# Patient Record
Sex: Male | Born: 1960
Health system: Southern US, Community
[De-identification: ages and names within clinical notes are randomized; demographics above are authoritative.]

## PROBLEM LIST (undated history)

## (undated) DIAGNOSIS — E119 Type 2 diabetes mellitus without complications: Secondary | ICD-10-CM

## (undated) DIAGNOSIS — M1990 Unspecified osteoarthritis, unspecified site: Secondary | ICD-10-CM

## (undated) DIAGNOSIS — I10 Essential (primary) hypertension: Secondary | ICD-10-CM

---
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
## 2016-05-17 ENCOUNTER — Encounter (HOSPITAL_COMMUNITY): Payer: Self-pay | Admitting: *Deleted

## 2016-05-17 ENCOUNTER — Ambulatory Visit (HOSPITAL_COMMUNITY)
Admission: EM | Admit: 2016-05-17 | Discharge: 2016-05-17 | Disposition: A | Discharge status: Home / Self Care | Payer: BLUE CROSS/BLUE SHIELD | Attending: Emergency Medicine | Admitting: Emergency Medicine

## 2016-05-17 DIAGNOSIS — S39012A Strain of muscle, fascia and tendon of lower back, initial encounter: Secondary | ICD-10-CM

## 2016-05-17 MED ORDER — HYDROCODONE-ACETAMINOPHEN 5-325 MG PO TABS
1.0000 | ORAL_TABLET | Freq: Once | ORAL | Status: AC
  Administered 2016-05-17: 1 via ORAL

## 2016-05-17 MED ORDER — HYDROCODONE-ACETAMINOPHEN 5-325 MG PO TABS
ORAL_TABLET | ORAL | Status: AC
  Filled 2016-05-17: qty 1

## 2016-05-17 MED ORDER — HYDROCODONE-ACETAMINOPHEN 5-325 MG PO TABS: 1.0000 | ORAL_TABLET | ORAL | 0 refills | Status: DC | PRN

## 2016-05-17 MED ORDER — DICLOFENAC SODIUM 50 MG PO TBEC: 50.0000 mg | DELAYED_RELEASE_TABLET | Freq: Three times a day (TID) | ORAL | 0 refills | Status: DC

## 2016-05-17 MED ORDER — CYCLOBENZAPRINE HCL 5 MG PO TABS: 5.0000 mg | ORAL_TABLET | Freq: Three times a day (TID) | ORAL | 0 refills | Status: DC | PRN

## 2016-05-17 NOTE — ED Provider Notes (Signed)
[facility]    CSN: [id number] Arrival date & time: 05/17/16  1439     History   Chief Complaint Chief Complaint  Patient presents with  . Back Pain    HPI Marcus [name] is a 56 y.o. male.   HPI  He is a 56 year old man here for evaluation of right mid back pain. He states this morning he was cooking breakfast when he twisted and felt a pain in his right mid back. This has been persistent despite doing a hot bath and 400 mg of ibuprofen. The pain will sometimes travel into the right leg and across the back. No bowel or bladder incontinence. No history of back problems.  History reviewed. No pertinent past medical history.  There are no active problems to display for this patient.   History reviewed. No pertinent surgical history.     Home Medications    Prior to Admission medications   Medication Sig Start Date End Date Taking? Authorizing Provider  cyclobenzaprine (FLEXERIL) 5 MG tablet Take 1 tablet (5 mg total) by mouth 3 (three) times daily as needed for muscle spasms (pain). 05/17/16   Charm RingsErin J Keren Alverio, MD  diclofenac (VOLTAREN) 50 MG EC tablet Take 1 tablet (50 mg total) by mouth 3 (three) times daily. 05/17/16   Charm RingsErin J Ayrianna Mcginniss, MD  HYDROcodone-acetaminophen (NORCO) 5-325 MG tablet Take 1 tablet by mouth every 4 (four) hours as needed for moderate pain. 05/17/16   Charm RingsErin J Jerimy Johanson, MD    Family History No family history on file.  Social History Social History  Substance Use Topics  . Smoking status: Current Every Day Smoker    Types: Cigarettes  . Smokeless tobacco: Never Used  . Alcohol use 3.6 oz/week    6 Cans of beer per week     Allergies   Patient has no known allergies.   Review of Systems Review of Systems As in history of present illness  Physical Exam Triage Vital Signs ED Triage Vitals  Enc Vitals Group     BP 05/17/16 1703 146/72     Pulse Rate 05/17/16 1703 (!) 59     Resp 05/17/16 1703 14     Temp 05/17/16 1703 98.5 F (36.9 C)      Temp src --      SpO2 05/17/16 1703 100 %     Weight --      Height --      Head Circumference --      Peak Flow --      Pain Score 05/17/16 1706 10     Pain Loc --      Pain Edu? --      Excl. in GC? --    No data found.   Updated Vital Signs BP 146/72   Pulse (!) 59   Temp 98.5 F (36.9 C)   Resp 14   SpO2 100%   Visual Acuity Right Eye Distance:   Left Eye Distance:   Bilateral Distance:    Right Eye Near:   Left Eye Near:    Bilateral Near:     Physical Exam  Constitutional: He is oriented to person, place, and time. He appears well-developed and well-nourished. No distress.  Cardiovascular: Normal rate.   Pulmonary/Chest: Effort normal.  Musculoskeletal:  Back: No erythema or edema. No vertebral tenderness or step-offs. He does have spasm and tenderness to the right paraspinous muscles at about L1. 5 out of 5 strength in bilateral lower extremities. Negative straight leg  raise bilaterally.  Neurological: He is alert and oriented to person, place, and time.     UC Treatments / Results  Labs (all labs ordered are listed, but only abnormal results are displayed) Labs Reviewed - No data to display  EKG  EKG Interpretation None       Radiology No results found.  Procedures Procedures (including critical care time)  Medications Ordered in UC Medications  HYDROcodone-acetaminophen (NORCO/VICODIN) 5-325 MG per tablet 1 tablet (1 tablet Oral Given 05/17/16 1748)     Initial Impression / Assessment and Plan / UC Course  I have reviewed the triage vital signs and the nursing notes.  Pertinent labs & imaging results that were available during my care of the patient were reviewed by me and considered in my medical decision making (see chart for details).     Symptomatic treatment with ice/heat, close back, Flexeril. Prescription for hydrocodone given to use as needed for severe pain. Discussed expected time course. Follow-up as needed.  Final  Clinical Impressions(s) / UC Diagnoses   Final diagnoses:  Lumbar strain, initial encounter    New Prescriptions Discharge Medication List as of 05/17/2016  5:48 PM    START taking these medications   Details  cyclobenzaprine (FLEXERIL) 5 MG tablet Take 1 tablet (5 mg total) by mouth 3 (three) times daily as needed for muscle spasms (pain)., Starting Sun 05/17/2016, Normal    diclofenac (VOLTAREN) 50 MG EC tablet Take 1 tablet (50 mg total) by mouth 3 (three) times daily., Starting Sun 05/17/2016, Normal    HYDROcodone-acetaminophen (NORCO) 5-325 MG tablet Take 1 tablet by mouth every 4 (four) hours as needed for moderate pain., Starting Sun 05/17/2016, Print         Charm Rings, MD 05/17/16 [phone]

## 2016-05-17 NOTE — Discharge Instructions (Signed)
You likely have a small tear in the muscle of your back. Alternate ice and heat today and tomorrow. After that, just use heat. Take Flexeril 3 times a day as needed for pain and muscle tightness. Take diclofenac 3 times a day for the next 3 days, then as needed for pain. Use the hydrocodone every 4 hours as needed for severe pain. Do not drive while taking this medicine. This will improve over the next 2 days, but will take 2 weeks to fully heal. Follow up as needed.

## 2016-05-17 NOTE — ED Triage Notes (Signed)
Reports cooking breakfast early this AM when he turned around to pick something up and felt sudden onset right mid-back pain.  Denies any parasthesias.  Ambulating without difficulty.  Has taken 400mg  IBU without any relief.  Denies any hx back problems.

## 2016-09-15 ENCOUNTER — Emergency Department: Payer: BLUE CROSS/BLUE SHIELD

## 2016-09-15 ENCOUNTER — Emergency Department
Admission: EM | Admit: 2016-09-15 | Discharge: 2016-09-15 | Disposition: A | Discharge status: Home / Self Care | Payer: BLUE CROSS/BLUE SHIELD | Attending: Student in an Organized Health Care Education/Training Program | Admitting: Student in an Organized Health Care Education/Training Program

## 2016-09-15 ENCOUNTER — Encounter: Payer: Self-pay | Admitting: Emergency Medicine

## 2016-09-15 DIAGNOSIS — F17210 Nicotine dependence, cigarettes, uncomplicated: Secondary | ICD-10-CM | POA: Diagnosis not present

## 2016-09-15 DIAGNOSIS — K130 Diseases of lips: Secondary | ICD-10-CM | POA: Diagnosis not present

## 2016-09-15 DIAGNOSIS — R220 Localized swelling, mass and lump, head: Secondary | ICD-10-CM | POA: Diagnosis present

## 2016-09-15 DIAGNOSIS — K046 Periapical abscess with sinus: Secondary | ICD-10-CM | POA: Insufficient documentation

## 2016-09-15 DIAGNOSIS — K047 Periapical abscess without sinus: Secondary | ICD-10-CM

## 2016-09-15 LAB — CBC WITH DIFFERENTIAL/PLATELET
BASOS ABS: 0.1 10*3/uL (ref 0–0.1)
Basophils Relative: 1 %
Eosinophils Absolute: 0.3 10*3/uL (ref 0–0.7)
Eosinophils Relative: 3 %
HCT: 42 % (ref 40.0–52.0)
HEMOGLOBIN: 14.2 g/dL (ref 13.0–18.0)
LYMPHS ABS: 3.9 10*3/uL — AB (ref 1.0–3.6)
LYMPHS PCT: 36 %
MCH: 30.2 pg (ref 26.0–34.0)
MCHC: 33.9 g/dL (ref 32.0–36.0)
MCV: 89.1 fL (ref 80.0–100.0)
Monocytes Absolute: 0.8 10*3/uL (ref 0.2–1.0)
Monocytes Relative: 7 %
NEUTROS PCT: 53 %
Neutro Abs: 5.9 10*3/uL (ref 1.4–6.5)
PLATELETS: 279 10*3/uL (ref 150–440)
RBC: 4.71 MIL/uL (ref 4.40–5.90)
RDW: 14.4 % (ref 11.5–14.5)
WBC: 10.9 10*3/uL — AB (ref 3.8–10.6)

## 2016-09-15 LAB — BASIC METABOLIC PANEL
ANION GAP: 7 (ref 5–15)
BUN: 12 mg/dL (ref 6–20)
CHLORIDE: 106 mmol/L (ref 101–111)
CO2: 25 mmol/L (ref 22–32)
Calcium: 9.3 mg/dL (ref 8.9–10.3)
Creatinine, Ser: 1.03 mg/dL (ref 0.61–1.24)
GFR calc Af Amer: >60 mL/min (ref >60)
Glucose, Bld: 133 mg/dL — ABNORMAL HIGH (ref 65–99)
POTASSIUM: 4 mmol/L (ref 3.5–5.1)
SODIUM: 138 mmol/L (ref 135–145)

## 2016-09-15 MED ORDER — CLINDAMYCIN PHOSPHATE 600 MG/50ML IV SOLN
600.0000 mg | Freq: Once | INTRAVENOUS | Status: AC
  Administered 2016-09-15: 600 mg via INTRAVENOUS
  Filled 2016-09-15: qty 50

## 2016-09-15 MED ORDER — ACETAMINOPHEN-CODEINE #3 300-30 MG PO TABS: 1.0000 | ORAL_TABLET | Freq: Three times a day (TID) | ORAL | 0 refills | Status: DC | PRN

## 2016-09-15 MED ORDER — PREDNISONE 10 MG PO TABS: 10.0000 mg | ORAL_TABLET | Freq: Every day | ORAL | 0 refills | Status: DC

## 2016-09-15 MED ORDER — IOPAMIDOL (ISOVUE-300) INJECTION 61%
75.0000 mL | Freq: Once | INTRAVENOUS | Status: AC | PRN
  Administered 2016-09-15: 75 mL via INTRAVENOUS
  Filled 2016-09-15: qty 75

## 2016-09-15 MED ORDER — CLINDAMYCIN HCL 300 MG PO CAPS: 300.0000 mg | ORAL_CAPSULE | Freq: Four times a day (QID) | ORAL | 0 refills | Status: AC

## 2016-09-15 NOTE — ED Provider Notes (Signed)
[facility] Emergency Department Provider Note ____________________________________________  Time seen: 1513  I have reviewed the triage vital signs and the nursing notes.  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 He inially noted swelling last night. He applied a warm compress and took ibuprofen. He awoke today with a moderate increase in the swelling. He went to work, but left early for ED evaluation. He denies local lip trauma or biting. He denies recent dental work, but admits to poor dentition. He denies fevers, chills, sweats, or spontaneous drainage. He denies any significant medical history, allergies, daily medications, or exposures.   History reviewed. No pertinent past medical history.  There are no active problems to display for this patient.  History reviewed. No pertinent surgical history.  Prior to Admission medications   Medication Sig Start Date End Date Taking? Authorizing Provider  clindamycin (CLEOCIN) 300 MG capsule Take 1 capsule (300 mg total) by mouth 4 (four) times daily. 09/15/16 09/25/16  Mycala Warshawsky, Charlesetta Ivory, PA-C  cyclobenzaprine (FLEXERIL) 5 MG tablet Take 1 tablet (5 mg total) by mouth 3 (three) times daily as needed for muscle spasms (pain). 05/17/16   Charm Rings, MD  diclofenac (VOLTAREN) 50 MG EC tablet Take 1 tablet (50 mg total) by mouth 3 (three) times daily. 05/17/16   Charm Rings, MD  HYDROcodone-acetaminophen (NORCO) 5-325 MG tablet Take 1 tablet by mouth every 4 (four) hours as needed for moderate pain. 05/17/16   Charm Rings, MD    Allergies Patient has no known allergies.  History reviewed. No pertinent family history.  Social History Social History  Substance Use Topics  . Smoking status: Current Every Day Smoker    Types: Cigarettes  . Smokeless tobacco: Never Used  . Alcohol use  3.6 oz/week    6 Cans of beer per week    Review of Systems  Constitutional: Negative for fever. Eyes: Negative for visual changes. ENT: Negative for sore throat. Upper lip swelling as above. Cardiovascular: Negative for chest pain. Respiratory: Negative for shortness of breath. Gastrointestinal: Negative for abdominal pain, vomiting and diarrhea. Skin: Negative for rash. Neurological: Negative for headaches, focal weakness or numbness. ____________________________________________  PHYSICAL EXAM:  VITAL SIGNS: ED Triage Vitals  Enc Vitals Group     BP 09/15/16 1439 134/75     Pulse Rate 09/15/16 1439 66     Resp 09/15/16 1439 18     Temp 09/15/16 1439 98.5 F (36.9 C)     Temp Source 09/15/16 1439 Oral     SpO2 09/15/16 1439 99 %     Weight --      Height --      Head Circumference --      Peak Flow --      Pain Score 09/15/16 1453 10     Pain Loc --      Pain Edu? --      Excl. in GC? --     Constitutional: Alert and oriented. Well appearing and in no distress. Head: Normocephalic and atraumatic. Eyes: Conjunctivae are normal. PERRL. Normal extraocular movements Ears: Canals clear. TMs intact bilaterally. Nose: No congestion/rhinorrhea/epistaxis. Mouth/Throat: Mucous membranes are moist. Poor dentition including decayed/broken/missing primary incisors, without obvious dental abscess or gum swelling. Upper lip with obvious, gross swelling. Palpable, firm mass within the upper lip and visible prominence to  the buccal side when the lip is reflected. No pointing, fluctuance, or spontaneous drainage.  Neck: Supple. No thyromegaly. Hematological/Lymphatic/Immunological: No cervical lymphadenopathy. Cardiovascular: Normal rate, regular rhythm. Normal distal pulses. Respiratory: Normal respiratory effort. No wheezes/rales/rhonchi. Musculoskeletal: Nontender with normal range of motion in all extremities.  Neurologic:  Normal gait without ataxia. Normal speech and language.  No gross focal neurologic deficits are appreciated. Skin:  Skin is warm, dry and intact. No rash noted. ____________________________________________   LABS (pertinent positives/negatives) Labs Reviewed  CBC WITH DIFFERENTIAL/PLATELET - Abnormal; Notable for the following:       Result Value   WBC 10.9 (*)    Lymphs Abs 3.9 (*)    All other components within normal limits  BASIC METABOLIC PANEL - Abnormal; Notable for the following:    Glucose, Bld 133 (*)    All other components within normal limits  ____________________________________________   RADIOLOGY  CT Maxillofacial w/ CM  IMPRESSION: 2.2 x 1 x 1.6 cm upper lip abscess likely odontogenic given poor dentition including absent tooth 9 with periapical abscess. Perioral cellulitis.  7 x 6 mm LEFT palatine tonsil cyst, unlikely to represent abscess. Recommend follow-up to exclude obstructing mass. ____________________________________________  PROCEDURES  Clindamycin 600 mg IVP ____________________________________________  INITIAL IMPRESSION / ASSESSMENT AND PLAN / ED COURSE  Patient with a clinical presentation consistent with an acute oral abscess due to underlying dental abscess. He is reassured by his normal labs. He is treated empirically with clindamycin IV and discharged home with PO Clinda, Tylenol #3, and prednisone 10 mg qd x 5 days. He will [facility] or a local dental provider for definitive dental care and extractions. Return precautions are reviewed. A work note is provided as requested.  ____________________________________________  FINAL CLINICAL IMPRESSION(S) / ED DIAGNOSES  Final diagnoses:  Lip abscess  Periapical abscess with facial involvement     Lissa HoardMenshew, Cearra Portnoy V Bacon, PA-C 09/15/16 1805    Willy Eddyobinson, Patrick, MD 09/15/16 [phone]

## 2016-09-15 NOTE — ED Notes (Signed)
See this RN's triage note.  

## 2016-09-15 NOTE — Discharge Instructions (Signed)
You are being treated for a dental abscess that has caused some swelling to your lip. Take the antibiotic as directed, until all of the pills are gone. You should follow-up with [facility], or one of the local dental clinics listed below. Return to the ED for worsening swelling, fevers, or pain.   OPTIONS FOR DENTAL FOLLOW UP CARE  [place] [facility] - Local Safety Net Dental Clinics [email or web address]   [facility] [phone])  [facility] [phone])  [place] [phone])  [facility] [phone])  [facility] [phone]) This clinic caters to the indigent population and is on a lottery system. Location: [employer] [facility], [employer], [address] [place] Clinic Hours: Wednesdays from 6pm - 9pm, patients seen by a lottery system. For dates, call or go to [email or web address] Services: Cleanings, fillings and simple extractions. Payment Options: DENTAL WORK IS FREE OF CHARGE. Bring proof of income or support. Best way to get seen: Arrive at 5:15 pm - this is a lottery, NOT first come/first serve, so arriving earlier will not increase your chances of being seen.     [facility] [phone] Select option 1 for emergencies   Location: [facility], [place], [address], [place] Clinic Hours: No walk-ins accepted - call the day before to schedule an appointment. Check in times are 9:30 am and 1:30 pm. Services: Simple extractions, temporary fillings, pulpectomy/pulp debridement, uncomplicated abscess drainage. Payment Options: PAYMENT IS DUE AT THE TIME OF SERVICE.  Fee is usually $100-200, additional surgical procedures (e.g. abscess drainage) may be extra. Cash, checks, Visa/MasterCard accepted.  Can file Medicaid if patient  is covered for dental - patient should call case worker to check. No discount for [facility] patients. Best way to get seen: MUST call the day before and get onto the schedule. Can usually be seen the next 1-2 days. No walk-ins accepted.     [facility] Dental Services [phone]   Location: [facility], [address] [place] Clinic Hours: M, W, Th, F 8am or 1:30pm, Tues 9a or 1:30 - first come/first served. Services: Simple extractions, temporary fillings, uncomplicated abscess drainage.  You do not need to be an [facility] resident. Payment Options: PAYMENT IS DUE AT THE TIME OF SERVICE. Dental insurance, otherwise sliding scale - bring proof of income or support. Depending on income and treatment needed, cost is usually $50-200. Best way to get seen: Arrive early as it is first come/first served.     [facility] [phone]   Location: [address] Clinic Hours: Mon-Thu 8a-5p Services: Most basic dental services including extractions and fillings. Payment Options: PAYMENT IS DUE AT THE TIME OF SERVICE. Sliding scale, up to 50% off - bring proof if income or support. Medicaid with dental option accepted. Best way to get seen: Call to schedule an appointment, can usually be seen within 2 weeks OR they will try to see walk-ins - show up at 8a or 2p (you may have to wait).     [facility] [phone] [place] RESIDENTS ONLY   Location: [facility], [address], [place], [place] [address] Clinic Hours: By appointment only. Monday - Thursday 8am-5pm, Friday 8am-12pm Services: Cleanings, fillings, extractions. Payment Options: PAYMENT IS DUE AT THE TIME OF SERVICE. Cash, Visa or MasterCard. Sliding scale - $30 minimum per service. Best  way to get seen: Come in to office, complete packet and make an appointment - need proof of income or support  monies for each household member and proof of [place] residence. Usually takes about a month to get in.     [facility] [phone]   Location: [address] [facility] Hours: Walk-in Urgent Care Dental Services are offered Monday-Friday mornings only. The numbers of emergencies accepted daily is limited to the number of providers available. Maximum 15 - Mondays, Wednesdays & Thursdays Maximum 10 - Tuesdays & Fridays Services: You do not need to be a [place] resident to be seen for a dental emergency. Emergencies are defined as pain, swelling, abnormal bleeding, or dental trauma. Walkins will receive x-rays if needed. NOTE: Dental cleaning is not an emergency. Payment Options: PAYMENT IS DUE AT THE TIME OF SERVICE. Minimum co-pay is $40.00 for uninsured patients. Minimum co-pay is $3.00 for Medicaid with dental coverage. Dental Insurance is accepted and must be presented at time of visit. Medicare does not cover dental. Forms of payment: Cash, credit card, checks. Best way to get seen: If not previously registered with the clinic, walk-in dental registration begins at 7:15 am and is on a first come/first serve basis. If previously registered with the clinic, call to make an appointment.     [facility] [phone] [place] RESIDENTS ONLY   Location: [address], [place], [place] Clinic Hours: Mon-Thu 10a-2p Services: Extractions only! Payment Options: FREE (donations accepted) - bring proof of income or support Best way to get seen: Call and schedule an appointment OR come at 8am on the 1st Monday of every month (except for holidays) when it is first come/first served.     [facility] [phone]   Location: [address], [place] Clinic Hours: Friday mornings Services, Payment Options, Best way to get seen: Call for info

## 2016-09-15 NOTE — ED Triage Notes (Signed)
Pt states that last night noted some swelling to his upper lip, woke up this morning with swelling and pain to to L side of his upper lip. No airway involvement, respirations even and unlabored at this time.

## 2017-09-17 IMAGING — CT CT MAXILLOFACIAL W/ CM
3 series · 15 of 47 positions shown, 18 images · IV contrast (isovue)
Comparison: None.

CLINICAL DATA: Lip and LEFT face swelling. Evaluate upper lip mass.

EXAM:
CT MAXILLOFACIAL WITH CONTRAST
TECHNIQUE: Multidetector CT imaging of the maxillofacial structures was
performed. Multiplanar CT image reconstructions were also generated.
A small metallic BB was placed on the right temple in order to
reliably differentiate right from left.
CONTRAST:  75 cc Isovue 300

[Series 2: max soft · axial · 0.33mm/px · z∈[-182,-34]mm · 9 of 86 slices shown, 12 images]
[im 6/86  brain]
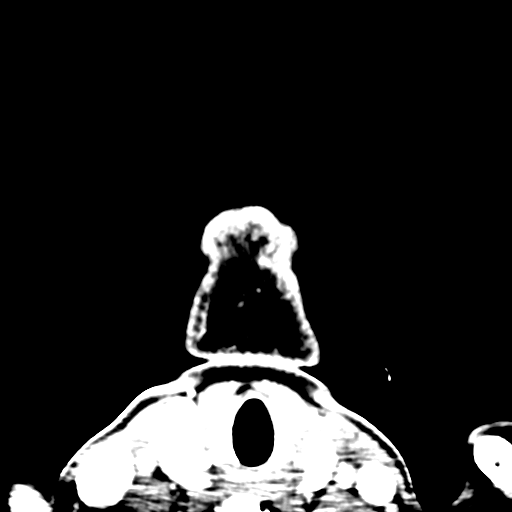
[im 6/86  bone]
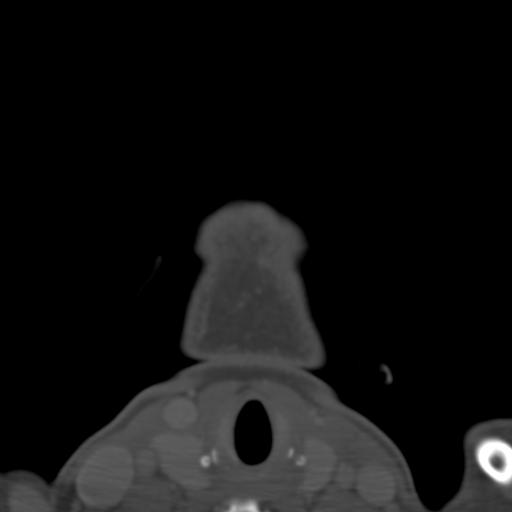
[im 15/86  bone]
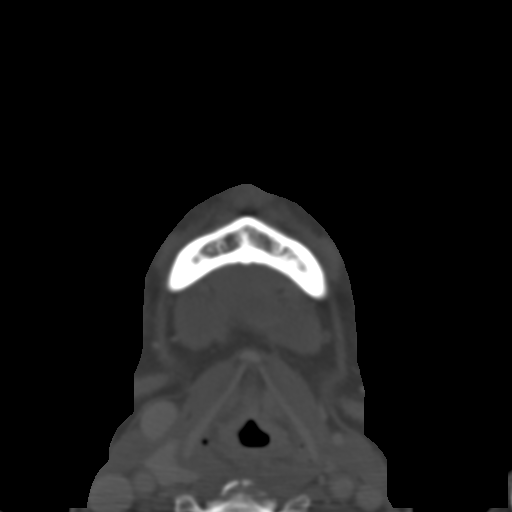
[im 24/86  bone]
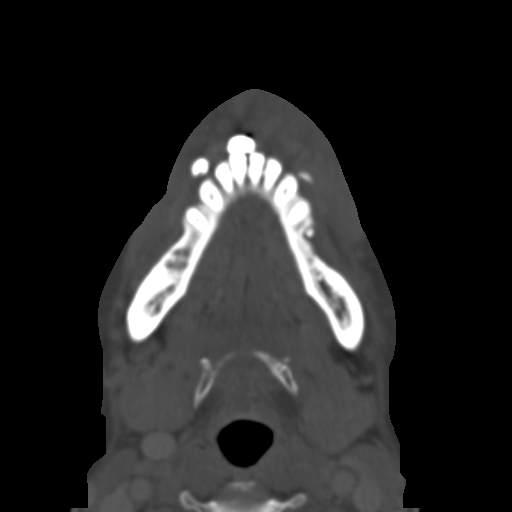
[im 33/86  bone]
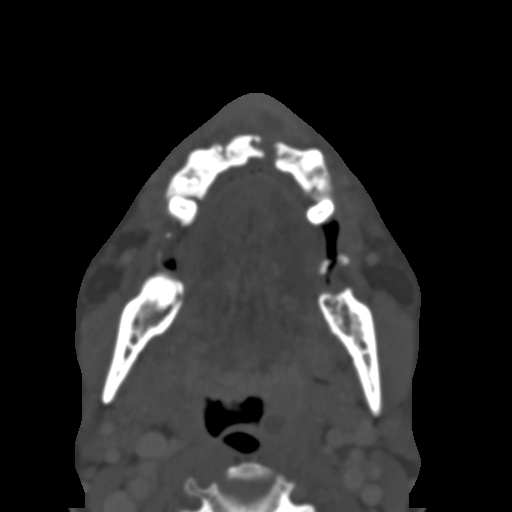
[im 44/86  brain]
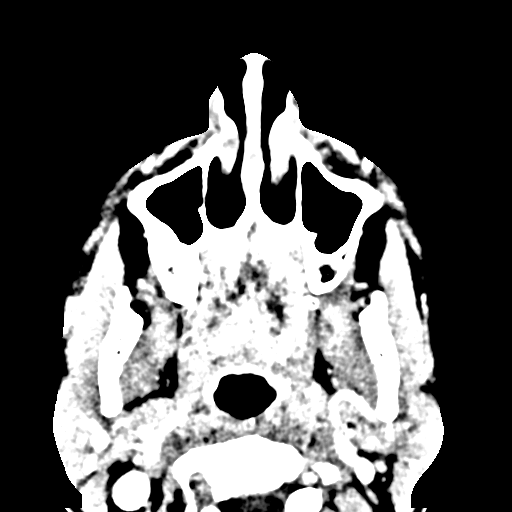
[im 44/86  bone]
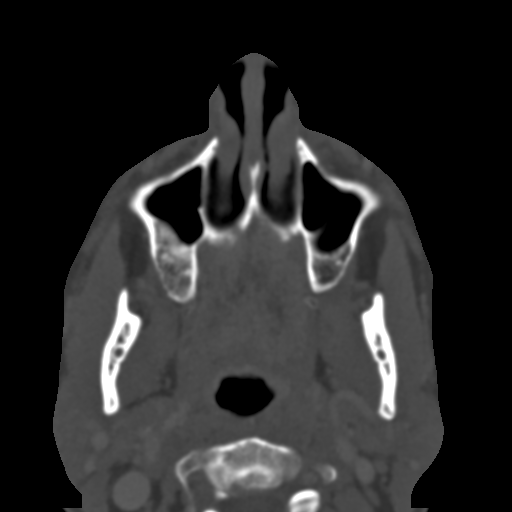
[im 53/86  bone]
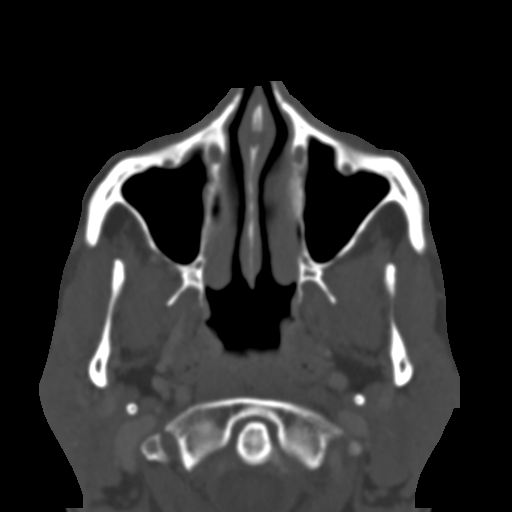
[im 62/86  bone]
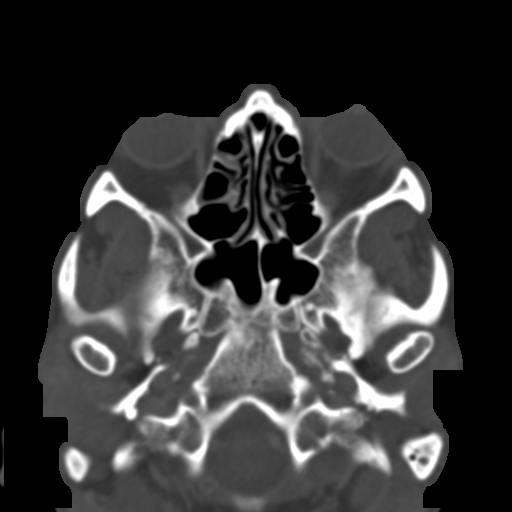
[im 71/86  bone]
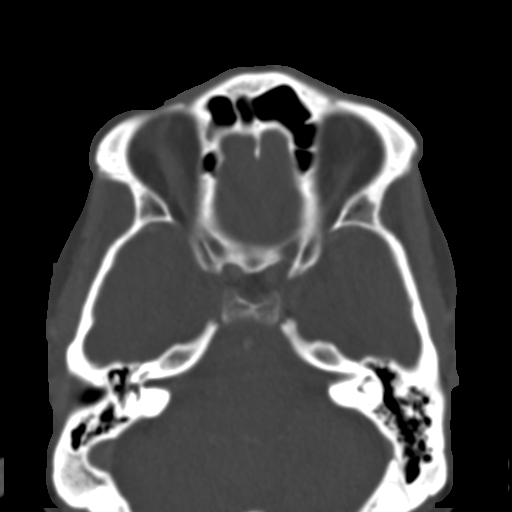
[im 80/86  brain]
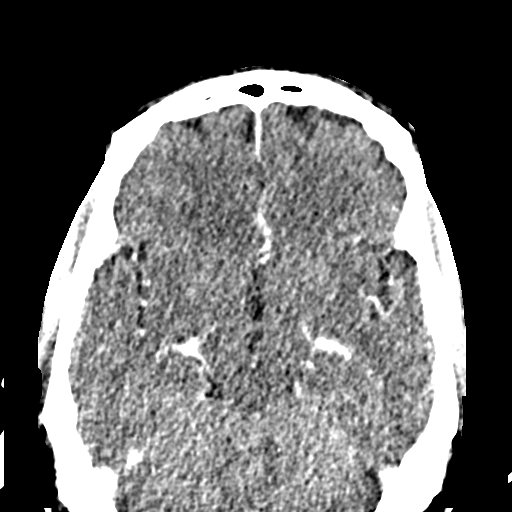
[im 80/86  bone]
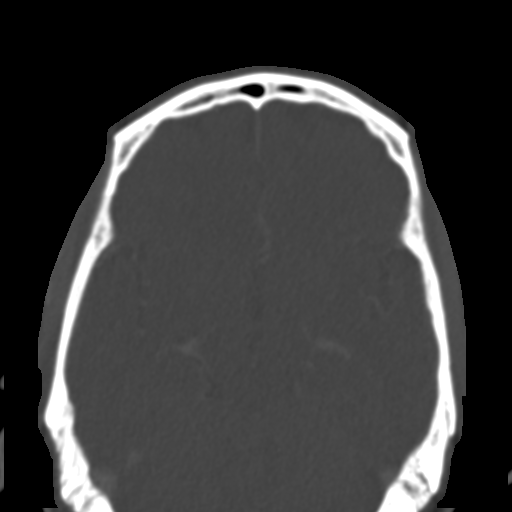

[Series 6: coronal soft · coronal · 0.37mm/px · 3 of 93 slices shown]
[im 31/93  bone]
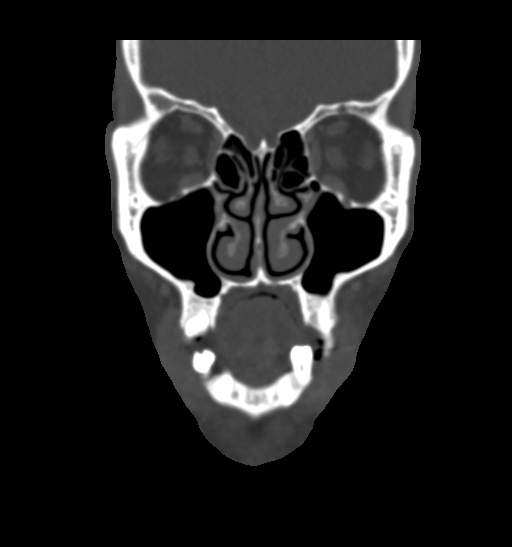
[im 41/93  bone]
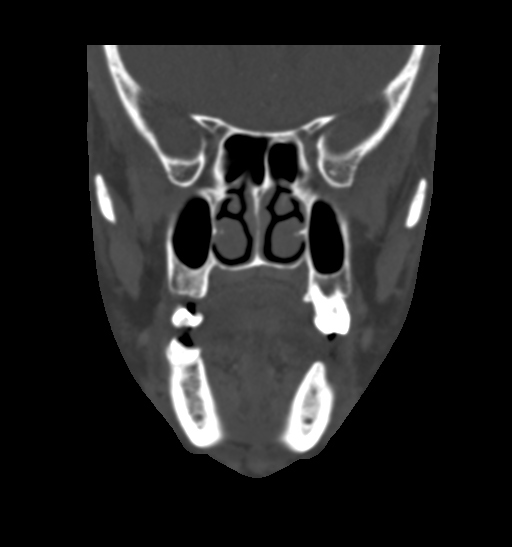
[im 52/93  bone]
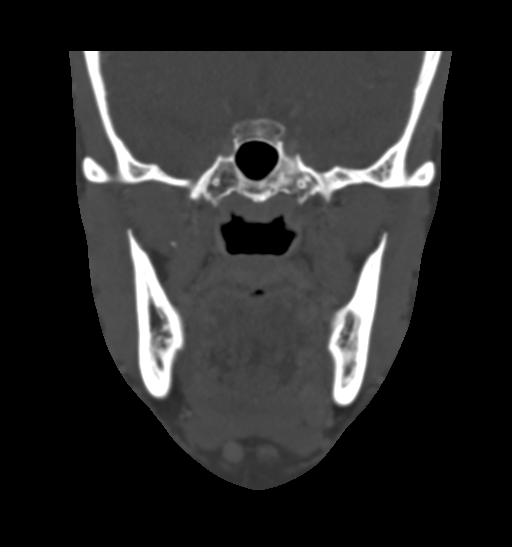

[Series 7: sagittal soft · sagittal · 0.40mm/px · 3 of 78 slices shown]
[im 26/78  bone]
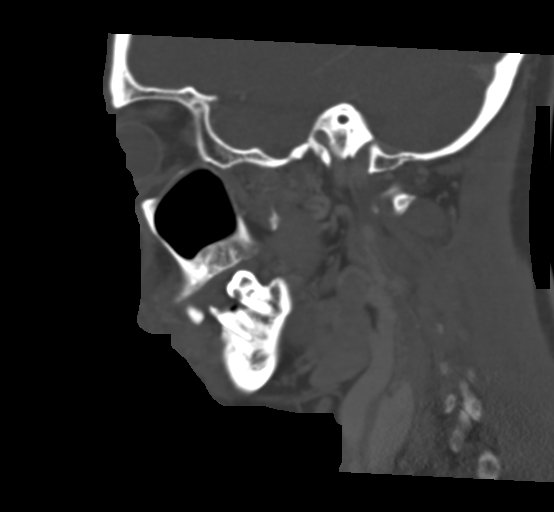
[im 39/78  bone]
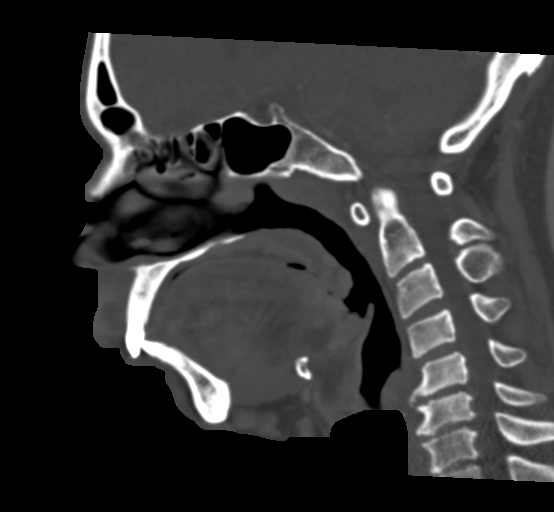
[im 52/78  bone]
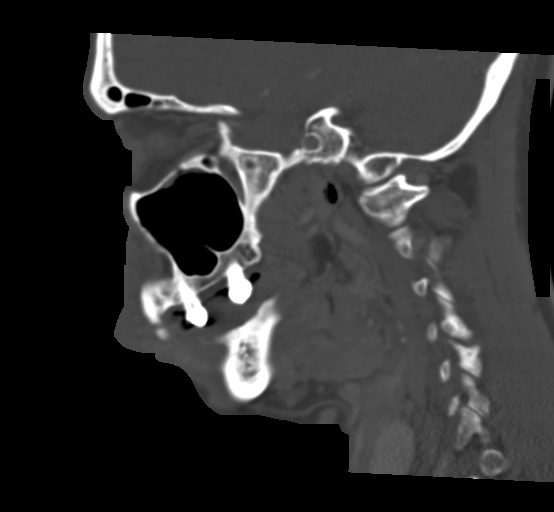

[15 of 47 positions shown; findings below may reference images not displayed]

FINDINGS: OSSEOUS: Poor dentition with multiple dental caries, and periapical
abscesses. Absent tooth 9 with ventral bony dehiscence compatible
with periapical abscess. The mandible is intact, the condyles are
located. No acute facial fracture. Moderate C5-6 and C6-7
degenerative discs. Severe LEFT C4-5 facet arthropathy and vacuum
phenomena.

ORBITS: Ocular globes and orbital contents are normal.

SINUSES: Trace paranasal sinus mucosal thickening without air-fluid
levels. Mastoid air cells are well aerated. Bilateral concha
lamella. Nasal septum is midline. Included mastoid aircells are well
aerated.

SOFT TISSUES: 2.2 x 1 x 1.6 cm (transverse by AP by CC) rim
enhancing midline upper lip/philtrum fluid collection. Diffuse
perioral soft tissue swelling and subcutaneous fat stranding without
subcutaneous gas or radiopaque foreign bodies. 7 x 6 mm LEFT
palatine tonsillar submucosal focal fluid without rim enhancement.
Prominent though not pathologically enlarged level 1A lymph nodes
are likely reactive. Mild calcific atherosclerosis of the carotid
bifurcations. Punctate LEFT parotid sialolith.

LIMITED INTRACRANIAL: Normal.
IMPRESSION: 2.2 x 1 x 1.6 cm upper lip abscess likely odontogenic given poor
dentition including absent tooth 9 with periapical abscess. Perioral
cellulitis.

7 x 6 mm LEFT palatine tonsil cyst, unlikely to represent abscess.
Recommend follow-up to exclude obstructing mass.

## 2018-09-12 DIAGNOSIS — K625 Hemorrhage of anus and rectum: Secondary | ICD-10-CM | POA: Diagnosis not present

## 2018-09-12 DIAGNOSIS — Z13220 Encounter for screening for lipoid disorders: Secondary | ICD-10-CM | POA: Diagnosis not present

## 2018-09-12 DIAGNOSIS — Z23 Encounter for immunization: Secondary | ICD-10-CM | POA: Diagnosis not present

## 2018-09-12 DIAGNOSIS — Z0000 Encounter for general adult medical examination without abnormal findings: Secondary | ICD-10-CM | POA: Diagnosis not present

## 2018-09-12 DIAGNOSIS — I499 Cardiac arrhythmia, unspecified: Secondary | ICD-10-CM | POA: Diagnosis not present

## 2018-09-12 DIAGNOSIS — Z113 Encounter for screening for infections with a predominantly sexual mode of transmission: Secondary | ICD-10-CM | POA: Diagnosis not present

## 2018-09-12 DIAGNOSIS — Z125 Encounter for screening for malignant neoplasm of prostate: Secondary | ICD-10-CM | POA: Diagnosis not present

## 2018-09-12 DIAGNOSIS — R350 Frequency of micturition: Secondary | ICD-10-CM | POA: Diagnosis not present

## 2018-09-12 DIAGNOSIS — Z1159 Encounter for screening for other viral diseases: Secondary | ICD-10-CM | POA: Diagnosis not present

## 2018-09-12 DIAGNOSIS — R739 Hyperglycemia, unspecified: Secondary | ICD-10-CM | POA: Diagnosis not present

## 2018-10-18 DIAGNOSIS — K921 Melena: Secondary | ICD-10-CM | POA: Diagnosis not present

## 2018-10-18 DIAGNOSIS — K648 Other hemorrhoids: Secondary | ICD-10-CM | POA: Diagnosis not present

## 2018-10-18 DIAGNOSIS — D123 Benign neoplasm of transverse colon: Secondary | ICD-10-CM | POA: Diagnosis not present

## 2018-10-18 DIAGNOSIS — D125 Benign neoplasm of sigmoid colon: Secondary | ICD-10-CM | POA: Diagnosis not present

## 2018-10-25 DIAGNOSIS — I10 Essential (primary) hypertension: Secondary | ICD-10-CM | POA: Diagnosis not present

## 2018-10-25 DIAGNOSIS — Z23 Encounter for immunization: Secondary | ICD-10-CM | POA: Diagnosis not present

## 2018-10-25 DIAGNOSIS — E1169 Type 2 diabetes mellitus with other specified complication: Secondary | ICD-10-CM | POA: Diagnosis not present

## 2018-10-25 DIAGNOSIS — E785 Hyperlipidemia, unspecified: Secondary | ICD-10-CM | POA: Diagnosis not present

## 2018-10-25 DIAGNOSIS — E781 Pure hyperglyceridemia: Secondary | ICD-10-CM | POA: Diagnosis not present

## 2018-12-20 DIAGNOSIS — H0288A Meibomian gland dysfunction right eye, upper and lower eyelids: Secondary | ICD-10-CM | POA: Diagnosis not present

## 2018-12-20 DIAGNOSIS — H02825 Cysts of left lower eyelid: Secondary | ICD-10-CM | POA: Diagnosis not present

## 2018-12-20 DIAGNOSIS — H0288B Meibomian gland dysfunction left eye, upper and lower eyelids: Secondary | ICD-10-CM | POA: Diagnosis not present

## 2018-12-20 DIAGNOSIS — E119 Type 2 diabetes mellitus without complications: Secondary | ICD-10-CM | POA: Diagnosis not present

## 2018-12-26 DIAGNOSIS — I10 Essential (primary) hypertension: Secondary | ICD-10-CM | POA: Diagnosis not present

## 2018-12-26 DIAGNOSIS — E785 Hyperlipidemia, unspecified: Secondary | ICD-10-CM | POA: Diagnosis not present

## 2018-12-26 DIAGNOSIS — E1169 Type 2 diabetes mellitus with other specified complication: Secondary | ICD-10-CM | POA: Diagnosis not present

## 2018-12-26 DIAGNOSIS — Z23 Encounter for immunization: Secondary | ICD-10-CM | POA: Diagnosis not present

## 2020-06-28 ENCOUNTER — Ambulatory Visit (HOSPITAL_COMMUNITY)
Admission: EM | Admit: 2020-06-28 | Discharge: 2020-06-28 | Disposition: A | Discharge status: Home / Self Care | Payer: 59

## 2020-06-28 ENCOUNTER — Other Ambulatory Visit: Payer: Self-pay

## 2020-06-28 ENCOUNTER — Encounter (HOSPITAL_COMMUNITY): Payer: Self-pay

## 2020-06-28 DIAGNOSIS — T148XXA Other injury of unspecified body region, initial encounter: Secondary | ICD-10-CM

## 2020-06-28 DIAGNOSIS — R350 Frequency of micturition: Secondary | ICD-10-CM

## 2020-06-28 DIAGNOSIS — R300 Dysuria: Secondary | ICD-10-CM | POA: Diagnosis not present

## 2020-06-28 DIAGNOSIS — M545 Low back pain, unspecified: Secondary | ICD-10-CM | POA: Diagnosis not present

## 2020-06-28 DIAGNOSIS — E785 Hyperlipidemia, unspecified: Secondary | ICD-10-CM

## 2020-06-28 DIAGNOSIS — I10 Essential (primary) hypertension: Secondary | ICD-10-CM

## 2020-06-28 HISTORY — DX: Type 2 diabetes mellitus without complications: E11.9

## 2020-06-28 HISTORY — DX: Essential (primary) hypertension: I10

## 2020-06-28 LAB — POCT URINALYSIS DIPSTICK, ED / UC
Bilirubin Urine: NEGATIVE
Glucose, UA: NEGATIVE mg/dL
Hgb urine dipstick: NEGATIVE
Leukocytes,Ua: NEGATIVE
Nitrite: NEGATIVE
Protein, ur: NEGATIVE mg/dL
Specific Gravity, Urine: 1.025 (ref 1.005–1.030)
Urobilinogen, UA: 0.2 mg/dL (ref 0.0–1.0)
pH: 5.5 (ref 5.0–8.0)

## 2020-06-28 MED ORDER — ATORVASTATIN CALCIUM 20 MG PO TABS: 20.0000 mg | ORAL_TABLET | Freq: Every day | ORAL | 1 refills | Status: DC

## 2020-06-28 MED ORDER — CYCLOBENZAPRINE HCL 5 MG PO TABS: 5.0000 mg | ORAL_TABLET | Freq: Three times a day (TID) | ORAL | 0 refills | Status: DC | PRN

## 2020-06-28 MED ORDER — LISINOPRIL 10 MG PO TABS: 10.0000 mg | ORAL_TABLET | Freq: Every day | ORAL | 1 refills | Status: DC

## 2020-06-28 NOTE — ED Provider Notes (Signed)
[facility]   CC: UTI  SUBJECTIVE:  Marcus [name] is a 60 y.o. male who complains of urinary frequency, urgency and dysuria for the past 2 weeks. Reports that he sometimes feels that he cannot fully empty his bladder at times. Reports L sided low back pain as well that does not radiate. Reports that pain is worse in the mornings. Patient denies a precipitating event, recent sexual encounter, excessive caffeine intake. Pain is intermittent and describes it as aching. Has tried OTC medications without relief. Symptoms are made worse with urination. Admits to similar symptoms in the past. Denies fever, chills, nausea, vomiting, abdominal pain, flank pain  Also requesting refill for atorvastatin and lisinopril  ROS: As in HPI.  All other pertinent ROS negative.     Past Medical History:  Diagnosis Date  . Diabetes mellitus without complication (HCC)   . Hypertension    History reviewed. No pertinent surgical history. No Known Allergies No current facility-administered medications on file prior to encounter.   Current Outpatient Medications on File Prior to Encounter  Medication Sig Dispense Refill  . acetaminophen-codeine (TYLENOL #3) 300-30 MG tablet Take 1 tablet by mouth every 8 (eight) hours as needed for moderate pain. 12 tablet 0  . diclofenac (VOLTAREN) 50 MG EC tablet Take 1 tablet (50 mg total) by mouth 3 (three) times daily. 30 tablet 0  . HYDROcodone-acetaminophen (NORCO) 5-325 MG tablet Take 1 tablet by mouth every 4 (four) hours as needed for moderate pain. 15 tablet 0  . predniSONE (DELTASONE) 10 MG tablet Take 1 tablet (10 mg total) by mouth daily with breakfast. 5 tablet 0   Social History   Socioeconomic History  . Marital status: Single    Spouse name: Not on file  . Number of children: Not on file  . Years of education: Not on file  . Highest education level: Not on file  Occupational History  . Not on file  Tobacco Use  . Smoking status: Current  Every Day Smoker    Types: Cigarettes  . Smokeless tobacco: Never Used  Vaping Use  . Vaping Use: Never used  Substance and Sexual Activity  . Alcohol use: Yes    Alcohol/week: 6.0 standard drinks    Types: 6 Cans of beer per week  . Drug use: No  . Sexual activity: Not on file  Other Topics Concern  . Not on file  Social History Narrative  . Not on file   Social Determinants of Health   Financial Resource Strain: Not on file  Food Insecurity: Not on file  Transportation Needs: Not on file  Physical Activity: Not on file  Stress: Not on file  Social Connections: Not on file  Intimate Partner Violence: Not on file   History reviewed. No pertinent family history.  OBJECTIVE:  Vitals:   06/28/20 1118 06/28/20 1119  BP:  (!) 151/88  Pulse: 60   Resp: 19   Temp: 97.6 F (36.4 C)   SpO2: 98%    General appearance: AOx3 in no acute distress HEENT: NCAT. Oropharynx clear.  Lungs: clear to auscultation bilaterally without adventitious breath sounds Heart: regular rate and rhythm. Radial pulses 2+ symmetrical bilaterally Abdomen: soft; non-distended; no tenderness; bowel sounds present; no guarding or rebound tenderness Back: no CVA tenderness, paraspinal muscles to L side in spasm and TTP  Extremities: no edema; symmetrical with no gross deformities Skin: warm and dry Neurologic: Ambulates from chair to exam table without difficulty Psychological: alert and cooperative; normal  mood and affect  Labs Reviewed  POCT URINALYSIS DIPSTICK, ED / UC - Abnormal; Notable for the following components:      Result Value   Ketones, ur TRACE (*)    All other components within normal limits    ASSESSMENT & PLAN:  1. Muscle strain   2. Acute left-sided low back pain without sciatica   3. Dysuria   4. Urinary frequency   5. Essential hypertension   6. Hyperlipidemia, unspecified hyperlipidemia type     Meds ordered this encounter  Medications  . atorvastatin (LIPITOR) 20 MG  tablet    Sig: Take 1 tablet (20 mg total) by mouth daily.    Dispense:  30 tablet    Refill:  1    Order Specific Question:   Supervising Provider    Answer:   Merrilee Jansky [id number]  . lisinopril (ZESTRIL) 10 MG tablet    Sig: Take 1 tablet (10 mg total) by mouth daily.    Dispense:  30 tablet    Refill:  1    Order Specific Question:   Supervising Provider    Answer:   Merrilee Jansky [id number]  . cyclobenzaprine (FLEXERIL) 5 MG tablet    Sig: Take 1 tablet (5 mg total) by mouth 3 (three) times daily as needed for muscle spasms (pain).    Dispense:  30 tablet    Refill:  0    Order Specific Question:   Supervising Provider    Answer:   Merrilee Jansky [[id number]]    Refilled atorvastatin Refilled lisinopril Prescribed flexeril  UA today unremarkable for infection Discussed getting established with primary care Push fluids and get plenty of rest  Follow up with PCP if symptoms persists Return here or go to ER if you have any new or worsening symptoms such as fever, worsening abdominal pain, nausea/vomiting, flank pain  Outlined signs and symptoms indicating need for more acute intervention Patient verbalized understanding After Visit Summary given     Moshe Cipro, NP 06/28/20 1146

## 2020-06-28 NOTE — Discharge Instructions (Addendum)
Atorvastatin and Lisinopril refilled. Get established with primary care.  Urine today does not show any infection  I have sent in flexeril for you to take twice a day as needed for muscle spasms. This medication can make you sleepy. Do not drive or operate heavy machinery with this medication.  Follow up with this office or with primary care if symptoms are persisting.  Follow up in the ER for high fever, trouble swallowing, trouble breathing, other concerning symptoms.

## 2020-06-28 NOTE — ED Triage Notes (Addendum)
Pt in with c/o back pain and urinary frequency that has been going on for about 5 month  Also requesting medication refill for his atorvastatin and lisinopril medication, states he has not had medication in about 3 months

## 2020-08-02 ENCOUNTER — Encounter: Payer: 59 | Admitting: Family

## 2020-08-02 ENCOUNTER — Other Ambulatory Visit: Payer: Self-pay

## 2020-08-02 NOTE — Progress Notes (Signed)
Patient did not show for appointment.   

## 2020-09-26 NOTE — Progress Notes (Signed)
Subjective:    Marcus [name] - 60 y.o. male MRN [id number]  Date of birth: [birth year]  HPI  Marcus [name] is to establish care.   Current issues and/or concerns: 1. DIABETES TYPE 2:  Last A1C:    Results for orders placed or performed in visit on 09/27/20  POCT glycosylated hemoglobin (Hb A1C)  Result Value Ref Range   Hemoglobin A1C 7.8 (A) 4.0 - 5.6 %   HbA1c POC (<> result, manual entry)     HbA1c, POC (prediabetic range)     HbA1c, POC (controlled diabetic range)      Diet Adherence: []  Yes    [x]  No Exercise: []  Yes    [x]  No Comments: Reports has not ever been prescribed diabetic medication before but was told that he was a diabetic some time ago.    2. KNEE PAIN: Duration: weeks Involved knee: left Mechanism of injury: none  Location:medial Onset: sudden Radiation: no Aggravating factors: weight bearing and walking  Alleviating factors: nothing  Status: worse Treatments attempted: Indomethacin    Relief with NSAIDs?: no Locking: no Popping: no Bruising: no Swelling: yes Redness: no Paresthesias/decreased sensation: no  Comments: Reports recently prescribed Indomethacin for the same left knee pain. Upon awakening a day or so later noticed the left knee was swollen. Has not taken Indomethacin since then. Reports does a lot of standing when working at the .   ROS per HPI    Health Maintenance:  Health Maintenance Due  Topic Date Due  . COVID-19 Vaccine (1) Never done  . Pneumococcal Vaccine 85-75 Years old (1 of 2 - PPSV23) Never done  . HIV Screening  Never done  . Hepatitis C Screening  Never done  . TETANUS/TDAP  Never done  . COLONOSCOPY (Pts 45-58yrs Insurance coverage will need to be confirmed)  Never done  . Zoster Vaccines- Shingrix (1 of 2) Never done    Past Medical History: There are no problems to display for this patient.   Social History   reports that he has been smoking cigarettes. He has never used smokeless tobacco. He  reports current alcohol use of about 6.0 standard drinks of alcohol per week. He reports current drug use. Drug: Marijuana.   Family History  family history includes Cancer in his father.   Medications: reviewed and updated   Objective:   Physical Exam BP 132/79 (BP Location: Left Arm, Patient Position: Sitting, Cuff Size: Normal)   Pulse 83   Temp 97.7 F (36.5 C)   Resp 14   Ht 5' 4.57" (1.64 m)   Wt 123 lb (55.8 kg)   SpO2 97%   BMI 20.74 kg/m  Physical Exam HENT:     Head: Normocephalic and atraumatic.  Eyes:     Extraocular Movements: Extraocular movements intact.     Conjunctiva/sclera: Conjunctivae normal.     Pupils: Pupils are equal, round, and reactive to light.  Cardiovascular:     Rate and Rhythm: Normal rate and regular rhythm.     Pulses: Normal pulses.     Heart sounds: Normal heart sounds.  Pulmonary:     Effort: Pulmonary effort is normal.     Breath sounds: Normal breath sounds.  Musculoskeletal:     Cervical back: Normal range of motion and neck supple.     Left knee: Swelling present. Tenderness present.  Neurological:     General: No focal deficit present.     Mental Status: He is alert and oriented to person, place,  and time.  Psychiatric:        Mood and Affect: Mood normal.        Behavior: Behavior normal.       Assessment & Plan:  1. Encounter to establish care: - Patient presents today to establish care.  - Return for annual physical examination, labs, and health maintenance. Arrive fasting meaning having no food for at least 8 hours prior to appointment. You may have only water or black coffee. Please take scheduled medications as normal.  2. Type 2 diabetes mellitus without complication, without long-term current use of insulin (HCC): - Hemoglobin A1c today not at goal at 7.8%, goal < 7%. There is no record of previous hemoglobin A1c. Next hemoglobin A1c due September 2022.  - Begin Metformin as prescribed. - Discussed the importance  of healthy eating habits, low-carbohydrate diet, low-sugar diet, regular aerobic exercise (at least 150 minutes a week as tolerated) and medication compliance to achieve or maintain control of diabetes. - Follow-up with primary provider in 4 weeks or sooner if needed.  - POCT glycosylated hemoglobin (Hb A1C) - metFORMIN (GLUCOPHAGE) 500 MG tablet; Take 1 tablet (500 mg total) by mouth 2 (two) times daily with a meal.  Dispense: 60 tablet; Refill: 0  3. Chronic pain of left knee: - Duloxetine as prescribed. May cause drowsiness. Counseled patient to not consume if operating heavy machinery or driving. Counseled patient to not consume with alcohol or illicit substances. Patient verbalized understanding.  - Follow-up with primary provider in 4 weeks or sooner if needed.  - DULoxetine (CYMBALTA) 30 MG capsule; Take 1 capsule (30 mg total) by mouth daily for 14 days, THEN 2 capsules (60 mg total) daily for 16 days.  Dispense: 46 capsule; Refill: 0    Patient was given clear instructions to go to Emergency Department or return to medical center if symptoms don't improve, worsen, or new problems develop.The patient verbalized understanding.  I discussed the assessment and treatment plan with the patient. The patient was provided an opportunity to ask questions and all were answered. The patient agreed with the plan and demonstrated an understanding of the instructions.   The patient was advised to call back or seek an in-person evaluation if the symptoms worsen or if the condition fails to improve as anticipated.    Ricky Stabs, NP 09/27/2020, 1:09 PM [facility]

## 2020-09-27 ENCOUNTER — Other Ambulatory Visit: Payer: Self-pay

## 2020-09-27 ENCOUNTER — Encounter: Payer: Self-pay | Admitting: Family

## 2020-09-27 ENCOUNTER — Ambulatory Visit (INDEPENDENT_AMBULATORY_CARE_PROVIDER_SITE_OTHER): Payer: 59 | Admitting: Family

## 2020-09-27 VITALS — BP 132/79 | HR 83 | Temp 97.7°F | Resp 14 | Ht 64.57 in | Wt 123.0 lb

## 2020-09-27 DIAGNOSIS — Z7689 Persons encountering health services in other specified circumstances: Secondary | ICD-10-CM

## 2020-09-27 DIAGNOSIS — E119 Type 2 diabetes mellitus without complications: Secondary | ICD-10-CM | POA: Diagnosis not present

## 2020-09-27 DIAGNOSIS — G8929 Other chronic pain: Secondary | ICD-10-CM

## 2020-09-27 DIAGNOSIS — M25562 Pain in left knee: Secondary | ICD-10-CM

## 2020-09-27 LAB — POCT GLYCOSYLATED HEMOGLOBIN (HGB A1C): Hemoglobin A1C: 7.8 % — AB (ref 4.0–5.6)

## 2020-09-27 MED ORDER — DULOXETINE HCL 30 MG PO CPEP: ORAL_CAPSULE | ORAL | 0 refills | Status: DC

## 2020-09-27 MED ORDER — METFORMIN HCL 500 MG PO TABS: 500.0000 mg | ORAL_TABLET | Freq: Two times a day (BID) | ORAL | 0 refills | Status: DC

## 2020-09-27 NOTE — Progress Notes (Addendum)
Establish care Left knee swelling about 2 weeks ago pt states he noticed it after he started the indomethacin

## 2020-09-30 ENCOUNTER — Telehealth: Payer: Self-pay | Admitting: Family

## 2020-09-30 NOTE — Telephone Encounter (Signed)
Pt calling in requesting refills for   atorvastatin (LIPITOR) 20 MG tablet [[id number]] ENDED  indomethacin (INDOCIN) 50 MG capsule [[id number]]  lisinopril (ZESTRIL) 10 MG tablet [[id number]]     Pharmacy  [facility] [address] [place] - [address]  [address] [place] [address]  Phone:  [phone] Fax:  [phone]

## 2020-09-30 NOTE — Telephone Encounter (Signed)
Patient with recent office visit with me on 09/27/2020. Patient did not request these medications with the CMA or me. Let's see if we can get him scheduled for a visit with me, maybe a telemedicine if the patient would like or in-person so that we can discuss the details of the requested medication refills.

## 2020-10-01 NOTE — Telephone Encounter (Signed)
Pt has telemedicine appt now scheduled for 10/07/20 but is requesting a sooner refill.

## 2020-10-01 NOTE — Progress Notes (Signed)
Virtual Visit via Telephone Note  I connected with Marcus [name], on 10/02/2020 at 1:55 PM by telephone due to the COVID-19 pandemic and verified that I am speaking with the correct person using two identifiers.  Due to current restrictions/limitations of in-office visits due to the COVID-19 pandemic, this scheduled clinical appointment was converted to a telehealth visit.   Consent: I discussed the limitations, risks, security and privacy concerns of performing an evaluation and management service by telephone and the availability of in person appointments. I also discussed with the patient that there may be a patient responsible charge related to this service. The patient expressed understanding and agreed to proceed.   Location of Patient: Home  Location of Provider: [facility]   Persons participating in Telemedicine visit: Dorrian Tonche Ricky Stabs, NP Margorie John, CMA  History of Present Illness: Marcus [name] is a 60 year-old male who presents for hypertension management.  1. HYPERTENSION: Currently taking: see medication list  Med Adherence: [x]  Yes    []  No Medication side effects: []  Yes    [x]  No Adherence with salt restriction (low-salt diet): []  Yes  [x]  No Exercise: Yes []  No [x]  Home Monitoring?: []  Yes    [x]  No Monitoring Frequency: []  Yes    [x]  No Home BP results range: []  Yes    [x]  No Smoking [x]  Yes []  No SOB? []  Yes    [x]  No Chest Pain?: []  Yes    [x]  No  2. HYPERLIPIDEMIA:  Med Adherence: [x]  Yes    []  No Medication side effects: []  Yes    [x]  No Muscle aches:  []  Yes    [x]  No   Past Medical History:  Diagnosis Date   Diabetes mellitus without complication (HCC)    Hypertension    No Known Allergies  Current Outpatient Medications on File Prior to Visit  Medication Sig Dispense Refill   atorvastatin (LIPITOR) 20 MG tablet Take 1 tablet (20 mg total) by mouth daily. 30 tablet 1   DULoxetine (CYMBALTA)  30 MG capsule Take 1 capsule (30 mg total) by mouth daily for 14 days, THEN 2 capsules (60 mg total) daily for 16 days. 46 capsule 0   indomethacin (INDOCIN) 50 MG capsule Take 50 mg by mouth 3 (three) times daily.     lisinopril (ZESTRIL) 10 MG tablet Take 1 tablet (10 mg total) by mouth daily. 30 tablet 1   metFORMIN (GLUCOPHAGE) 500 MG tablet Take 1 tablet (500 mg total) by mouth 2 (two) times daily with a meal. 60 tablet 0   No current facility-administered medications on file prior to visit.    Observations/Objective: Alert and oriented x 3. Not in acute distress. Physical examination not completed as this is a telemedicine visit.  Assessment and Plan: 1. Essential hypertension: - Level of blood pressure control unknown as patient does not check at home.  - Continue Lisinopril as prescribed.  - Counseled on blood pressure goal of less than 130/80, low-sodium, DASH diet, medication compliance, 150 minutes of moderate intensity exercise per week as tolerated. Discussed medication compliance, adverse effects. - Follow-up with primary provider in 3 months or sooner if needed.  - lisinopril (ZESTRIL) 10 MG tablet; Take 1 tablet (10 mg total) by mouth daily.  Dispense: 90 tablet; Refill: 0  2. Hyperlipidemia, unspecified hyperlipidemia type: -Practice low-fat heart healthy diet and at least 150 minutes of moderate intensity exercise weekly as tolerated.  - Continue Atorvastatin as prescribed.  -  Follow-up with primary provider as scheduled.  - atorvastatin (LIPITOR) 20 MG tablet; Take 1 tablet (20 mg total) by mouth daily.  Dispense: 90 tablet; Refill: 0   Follow Up Instructions: Follow-up with primary provider in 3 months or sooner if needed.   Patient was given clear instructions to go to Emergency Department or return to medical center if symptoms don't improve, worsen, or new problems develop.The patient verbalized understanding.  I discussed the assessment and treatment plan with  the patient. The patient was provided an opportunity to ask questions and all were answered. The patient agreed with the plan and demonstrated an understanding of the instructions.   The patient was advised to call back or seek an in-person evaluation if the symptoms worsen or if the condition fails to improve as anticipated.    I provided 15 minutes total of non-face-to-face time during this encounter.   Rema Fendt, NP  [facility] [employer], [place] [phone] 10/02/2020, 1:55 PM

## 2020-10-02 ENCOUNTER — Other Ambulatory Visit: Payer: Self-pay

## 2020-10-02 ENCOUNTER — Telehealth (INDEPENDENT_AMBULATORY_CARE_PROVIDER_SITE_OTHER): Payer: 59 | Admitting: Family

## 2020-10-02 DIAGNOSIS — I10 Essential (primary) hypertension: Secondary | ICD-10-CM | POA: Diagnosis not present

## 2020-10-02 DIAGNOSIS — E785 Hyperlipidemia, unspecified: Secondary | ICD-10-CM | POA: Diagnosis not present

## 2020-10-02 MED ORDER — LISINOPRIL 10 MG PO TABS: 10.0000 mg | ORAL_TABLET | Freq: Every day | ORAL | 0 refills | Status: DC

## 2020-10-02 MED ORDER — ATORVASTATIN CALCIUM 20 MG PO TABS: 20.0000 mg | ORAL_TABLET | Freq: Every day | ORAL | 0 refills | Status: DC

## 2020-10-02 NOTE — Progress Notes (Signed)
Pt req refills on Atorvastatin, Lisinopril

## 2020-10-07 ENCOUNTER — Telehealth: Payer: 59 | Admitting: Family

## 2020-11-03 NOTE — Progress Notes (Signed)
Patient ID: Marcus [name], male    DOB: 03-25-1961  MRN: [id number]  CC: Annual Physical Exam  Subjective: Marcus [name] is a 60 y.o. male who presents for annual physical exam.   His concerns today include:  DIABETES TYPE 2 FOLLOW-UP: 09/27/2020: - Hemoglobin A1c today not at goal at 7.8%, goal < 7%. There is no record of previous hemoglobin A1c. Next hemoglobin A1c due September 2022.  - Begin Metformin as prescribed. - Follow-up with primary provider in 4 weeks or sooner if needed.  11/04/2020: Doing well on current regimen. No side effects. No issues/concerns.   2. KNEE PAIN FOLLOW-UP: 09/27/2020: - Duloxetine as prescribed. - Follow-up with primary provider in 4 weeks or sooner if needed.   11/04/2020: Reports knee pain worsening. Duloxetine not helping. Using over-the-counter creams and patches to help. 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 Requesting work note for tonight's shift.  Current Outpatient Medications on File Prior to Visit  Medication Sig Dispense Refill   atorvastatin (LIPITOR) 20 MG tablet Take 1 tablet (20 mg total) by mouth daily. 90 tablet 0   indomethacin (INDOCIN) 50 MG capsule Take 50 mg by mouth 3 (three) times daily.     lisinopril (ZESTRIL) 10 MG tablet Take 1 tablet (10 mg total) by mouth daily. 90 tablet 0   DULoxetine (CYMBALTA) 30 MG capsule Take 1 capsule (30 mg total) by mouth daily for 14 days, THEN 2 capsules (60 mg total) daily for 16 days. 46 capsule 0   No current facility-administered medications on file prior to visit.    No Known Allergies  Social History   Socioeconomic History   Marital status: Single    Spouse name: Not on file   Number of children: Not on file   Years of education: Not on file   Highest education level: Not on file  Occupational History   Not on file  Tobacco Use   Smoking status: Every Day    Pack years: 0.00    Types: Cigarettes   Smokeless tobacco: Never  Vaping Use   Vaping Use:  Never used  Substance and Sexual Activity   Alcohol use: Yes    Alcohol/week: 6.0 standard drinks    Types: 6 Standard drinks or equivalent per week   Drug use: Yes    Types: Marijuana   Sexual activity: Not on file  Other Topics Concern   Not on file  Social History Narrative   Not on file   Social Determinants of Health   Financial Resource Strain: Not on file  Food Insecurity: Not on file  Transportation Needs: Not on file  Physical Activity: Not on file  Stress: Not on file  Social Connections: Not on file  Intimate Partner Violence: Not on file    Family History  Problem Relation Age of Onset   Cancer Father     No past surgical history on file.  ROS: Review of Systems Negative except as stated above  PHYSICAL EXAM: BP 135/81 (BP Location: Left Arm, Patient Position: Sitting, Cuff Size: Normal)   Pulse 87   Temp 97.9 F (36.6 C)   Resp 16   Ht 5' 4.57" (1.64 m)   Wt 122 lb (55.3 kg)   SpO2 96%   BMI 20.58 kg/m    Physical Exam Constitutional:      Appearance: He is normal weight.  HENT:     Head: Normocephalic and atraumatic.     Right  Ear: Tympanic membrane, ear canal and external ear normal.     Left Ear: Tympanic membrane, ear canal and external ear normal.  Eyes:     Extraocular Movements: Extraocular movements intact.     Conjunctiva/sclera: Conjunctivae normal.     Pupils: Pupils are equal, round, and reactive to light.  Cardiovascular:     Rate and Rhythm: Normal rate and regular rhythm.     Pulses: Normal pulses.     Heart sounds: Normal heart sounds.  Pulmonary:     Effort: Pulmonary effort is normal.     Breath sounds: Normal breath sounds.  Abdominal:     General: Bowel sounds are normal.     Palpations: Abdomen is soft.  Genitourinary:    Comments: Patient declined exam. Musculoskeletal:     Cervical back: Normal range of motion and neck supple.     Comments: Posterior left knee with soft movable tender lump, about golf ball  sized. No evidence of drainage or erythema.   Skin:    General: Skin is warm and dry.     Capillary Refill: Capillary refill takes less than 2 seconds.  Neurological:     General: No focal deficit present.     Mental Status: He is alert and oriented to person, place, and time.  Psychiatric:        Mood and Affect: Mood normal.        Behavior: Behavior normal.    ASSESSMENT AND PLAN: 1. Annual physical exam: - Counseled on 150 minutes of exercise per week as tolerated, healthy eating (including decreased daily intake of saturated fats, cholesterol, added sugars, sodium), STI prevention, and routine healthcare maintenance.  2. Screening for metabolic disorder: - KNL97+QBHA to check kidney function, liver function, and electrolyte balance.  - CMP14+EGFR  3. Screening for deficiency anemia: - CBC to screen for anemia. - CBC  4. Thyroid disorder screen: - TSH to check thyroid function.  - TSH  5. Need for hepatitis C screening test: - Hepatitis C antibody to screen for hepatitis C.  - Hepatitis C Antibody  6. Encounter for screening for HIV: - HIV antibody to screen for human immunodeficiency virus.  - HIV antibody (with reflex)  7. Encounter for screening colonoscopy: - Patient declined. Reports had completed a few years ago and normal.   8. Type 2 diabetes mellitus without complication, without long-term current use of insulin ([place]): - Hemoglobin 7.8% on 09/27/2020. Next hemoglobin A1c due September 2022.  - Increase Metformin from 500 mg twice daily to 1000 mg twice daily.  - Discussed the importance of healthy eating habits, low-carbohydrate diet, low-sugar diet, regular aerobic exercise (at least 150 minutes a week as tolerated) and medication compliance to achieve or maintain control of diabetes. - Follow-up with primary provider in 8 weeks or sooner if needed.  - metFORMIN (GLUCOPHAGE) 1000 MG tablet; Take 1 tablet (1,000 mg total) by mouth 2 (two) times daily with a  meal.  Dispense: 180 tablet; Refill: 0  9. Hyperlipidemia, unspecified hyperlipidemia type: -Practice low-fat heart healthy diet and at least 150 minutes of moderate intensity exercise weekly as tolerated.  - Continue Atorvastatin as prescribed.  - Lipid panel to screen for high cholesterol.  - Follow-up with primary provider as scheduled.  - Lipid panel  10. Chronic pain of left knee: - Chronic and worsening.  - Patient provided with work note for tonight's shift.  - Referral to Orthopedic Surgery for further evaluation and management. - Ambulatory referral to Orthopedic Surgery  Patient was given the opportunity to ask questions.  Patient verbalized understanding of the plan and was able to repeat key elements of the plan. Patient was given clear instructions to go to Emergency Department or return to medical center if symptoms don't improve, worsen, or new problems develop.The patient verbalized understanding.   Orders Placed This Encounter  Procedures   HIV antibody (with reflex)   Hepatitis C Antibody   CBC   Lipid panel   TSH   CMP14+EGFR   Ambulatory referral to Orthopedic Surgery     Requested Prescriptions   Signed Prescriptions Disp Refills   metFORMIN (GLUCOPHAGE) 1000 MG tablet 180 tablet 0    Sig: Take 1 tablet (1,000 mg total) by mouth 2 (two) times daily with a meal.    Return in about 2 months (around 01/05/2021) for Follow-Up diabetes .  Camillia Herter, NP

## 2020-11-04 ENCOUNTER — Ambulatory Visit (INDEPENDENT_AMBULATORY_CARE_PROVIDER_SITE_OTHER): Payer: 59 | Admitting: Family

## 2020-11-04 ENCOUNTER — Other Ambulatory Visit: Payer: Self-pay

## 2020-11-04 VITALS — BP 135/81 | HR 87 | Temp 97.9°F | Resp 16 | Ht 64.57 in | Wt 122.0 lb

## 2020-11-04 DIAGNOSIS — Z1159 Encounter for screening for other viral diseases: Secondary | ICD-10-CM

## 2020-11-04 DIAGNOSIS — E785 Hyperlipidemia, unspecified: Secondary | ICD-10-CM

## 2020-11-04 DIAGNOSIS — G8929 Other chronic pain: Secondary | ICD-10-CM

## 2020-11-04 DIAGNOSIS — Z0000 Encounter for general adult medical examination without abnormal findings: Secondary | ICD-10-CM | POA: Diagnosis not present

## 2020-11-04 DIAGNOSIS — Z13228 Encounter for screening for other metabolic disorders: Secondary | ICD-10-CM

## 2020-11-04 DIAGNOSIS — M25562 Pain in left knee: Secondary | ICD-10-CM

## 2020-11-04 DIAGNOSIS — Z1329 Encounter for screening for other suspected endocrine disorder: Secondary | ICD-10-CM | POA: Diagnosis not present

## 2020-11-04 DIAGNOSIS — Z130 Encounter for screening for diseases of the blood and blood-forming organs and certain disorders involving the immune mechanism: Secondary | ICD-10-CM

## 2020-11-04 DIAGNOSIS — Z1211 Encounter for screening for malignant neoplasm of colon: Secondary | ICD-10-CM

## 2020-11-04 DIAGNOSIS — E119 Type 2 diabetes mellitus without complications: Secondary | ICD-10-CM

## 2020-11-04 DIAGNOSIS — Z114 Encounter for screening for human immunodeficiency virus [HIV]: Secondary | ICD-10-CM

## 2020-11-04 MED ORDER — METFORMIN HCL 1000 MG PO TABS: 1000.0000 mg | ORAL_TABLET | Freq: Two times a day (BID) | ORAL | 0 refills | Status: DC

## 2020-11-04 NOTE — Patient Instructions (Signed)
Preventive Care 40-61 Years Old, Male Preventive care refers to lifestyle choices and visits with your health care provider that can promote health and wellness. This includes: A yearly physical exam. This is also called an annual wellness visit. Regular dental and eye exams. Immunizations. Screening for certain conditions. Healthy lifestyle choices, such as: Eating a healthy diet. Getting regular exercise. Not using drugs or products that contain nicotine and tobacco. Limiting alcohol use. What can I expect for my preventive care visit? Physical exam Your health care provider will check your: Height and weight. These may be used to calculate your BMI (body mass index). BMI is a measurement that tells if you are at a healthy weight. Heart rate and blood pressure. Body temperature. Skin for abnormal spots. Counseling Your health care provider may ask you questions about your: Past medical problems. Family's medical history. Alcohol, tobacco, and drug use. Emotional well-being. Home life and relationship well-being. Sexual activity. Diet, exercise, and sleep habits. Work and work environment. Access to firearms. What immunizations do I need?  Vaccines are usually given at various ages, according to a schedule. Your health care provider will recommend vaccines for you based on your age, medicalhistory, and lifestyle or other factors, such as travel or where you work. What tests do I need? Blood tests Lipid and cholesterol levels. These may be checked every 5 years, or more often if you are over 50 years old. Hepatitis C test. Hepatitis B test. Screening Lung cancer screening. You may have this screening every year starting at age 55 if you have a 30-pack-year history of smoking and currently smoke or have quit within the past 15 years. Prostate cancer screening. Recommendations will vary depending on your family history and other risks. Genital exam to check for testicular cancer  or hernias. Colorectal cancer screening. All adults should have this screening starting at age 50 and continuing until age 75. Your health care provider may recommend screening at age 45 if you are at increased risk. You will have tests every 1-10 years, depending on your results and the type of screening test. Diabetes screening. This is done by checking your blood sugar (glucose) after you have not eaten for a while (fasting). You may have this done every 1-3 years. STD (sexually transmitted disease) testing, if you are at risk. Follow these instructions at home: Eating and drinking  Eat a diet that includes fresh fruits and vegetables, whole grains, lean protein, and low-fat dairy products. Take vitamin and mineral supplements as recommended by your health care provider. Do not drink alcohol if your health care provider tells you not to drink. If you drink alcohol: Limit how much you have to 0-2 drinks a day. Be aware of how much alcohol is in your drink. In the [place] one drink equals one 12 oz bottle of beer (355 mL), one 5 oz glass of wine (148 mL), or one 1 oz glass of hard liquor (44 mL).  Lifestyle Take daily care of your teeth and gums. Brush your teeth every morning and night with fluoride toothpaste. Floss one time each day. Stay active. Exercise for at least 30 minutes 5 or more days each week. Do not use any products that contain nicotine or tobacco, such as cigarettes, e-cigarettes, and chewing tobacco. If you need help quitting, ask your health care provider. Do not use drugs. If you are sexually active, practice safe sex. Use a condom or other form of protection to prevent STIs (sexually transmitted infections). If told by   your health care provider, take low-dose aspirin daily starting at age 50. Find healthy ways to cope with stress, such as: Meditation, yoga, or listening to music. Journaling. Talking to a trusted person. Spending time with friends and  family. Safety Always wear your seat belt while driving or riding in a vehicle. Do not drive: If you have been drinking alcohol. Do not ride with someone who has been drinking. When you are tired or distracted. While texting. Wear a helmet and other protective equipment during sports activities. If you have firearms in your house, make sure you follow all gun safety procedures. What's next? Go to your health care provider once a year for an annual wellness visit. Ask your health care provider how often you should have your eyes and teeth checked. Stay up to date on all vaccines. This information is not intended to replace advice given to you by your health care provider. Make sure you discuss any questions you have with your healthcare provider. Document Revised: 01/10/2019 Document Reviewed: 04/07/2018 Elsevier Patient Education  2022 Elsevier Inc.  

## 2020-11-04 NOTE — Progress Notes (Signed)
Pt presents for annual physical exam, states that the duloxetine did not help his nerve pain

## 2020-11-05 LAB — CMP14+EGFR
ALT: 10 IU/L (ref 0–44)
AST: 12 IU/L (ref 0–40)
Albumin/Globulin Ratio: 2 (ref 1.2–2.2)
Albumin: 4.7 g/dL (ref 3.8–4.8)
Alkaline Phosphatase: 88 IU/L (ref 44–121)
BUN/Creatinine Ratio: 14 (ref 10–24)
BUN: 10 mg/dL (ref 8–27)
Bilirubin Total: 0.6 mg/dL (ref 0.0–1.2)
CO2: 23 mmol/L (ref 20–29)
Calcium: 9.9 mg/dL (ref 8.6–10.2)
Chloride: 99 mmol/L (ref 96–106)
Creatinine, Ser: 0.71 mg/dL — ABNORMAL LOW (ref 0.76–1.27)
Globulin, Total: 2.3 g/dL (ref 1.5–4.5)
Glucose: 119 mg/dL — ABNORMAL HIGH (ref 65–99)
Potassium: 4.7 mmol/L (ref 3.5–5.2)
Sodium: 136 mmol/L (ref 134–144)
Total Protein: 7 g/dL (ref 6.0–8.5)
eGFR: 104 mL/min/{1.73_m2} (ref >59)

## 2020-11-05 LAB — TSH: TSH: 1.76 u[IU]/mL (ref 0.450–4.500)

## 2020-11-05 LAB — LIPID PANEL
Chol/HDL Ratio: 2.6 ratio (ref 0.0–5.0)
Cholesterol, Total: 155 mg/dL (ref 100–199)
HDL: 59 mg/dL (ref >39)
LDL Chol Calc (NIH): 76 mg/dL (ref 0–99)
Triglycerides: 109 mg/dL (ref 0–149)
VLDL Cholesterol Cal: 20 mg/dL (ref 5–40)

## 2020-11-05 LAB — CBC
Hematocrit: 46.9 % (ref 37.5–51.0)
Hemoglobin: 16.2 g/dL (ref 13.0–17.7)
MCH: 30.6 pg (ref 26.6–33.0)
MCHC: 34.5 g/dL (ref 31.5–35.7)
MCV: 89 fL (ref 79–97)
Platelets: 162 10*3/uL (ref 150–450)
RBC: 5.3 x10E6/uL (ref 4.14–5.80)
RDW: 13.5 % (ref 11.6–15.4)
WBC: 8.7 10*3/uL (ref 3.4–10.8)

## 2020-11-05 LAB — HIV ANTIBODY (ROUTINE TESTING W REFLEX): HIV Screen 4th Generation wRfx: NONREACTIVE

## 2020-11-05 LAB — HEPATITIS C ANTIBODY: Hep C Virus Ab: 0.1 s/co ratio (ref 0.0–0.9)

## 2020-11-05 NOTE — Progress Notes (Signed)
Please call patient with update.   Kidney function normal. Reminder to stay hydrated with water and avoid overuse of NSAID's such as Ibuprofen, Motrin, and Aleve.   Liver function normal.   Thyroid function normal.   Cholesterol normal.   No anemia.   Hepatitis C negative.  HIV negative.

## 2020-11-12 ENCOUNTER — Ambulatory Visit: Payer: 59 | Admitting: Orthopaedic Surgery

## 2020-11-26 ENCOUNTER — Encounter: Payer: Self-pay | Admitting: Orthopaedic Surgery

## 2020-11-26 ENCOUNTER — Other Ambulatory Visit: Payer: Self-pay

## 2020-11-26 ENCOUNTER — Ambulatory Visit (INDEPENDENT_AMBULATORY_CARE_PROVIDER_SITE_OTHER): Payer: 59

## 2020-11-26 ENCOUNTER — Ambulatory Visit (INDEPENDENT_AMBULATORY_CARE_PROVIDER_SITE_OTHER): Payer: 59 | Admitting: Orthopaedic Surgery

## 2020-11-26 DIAGNOSIS — G8929 Other chronic pain: Secondary | ICD-10-CM | POA: Diagnosis not present

## 2020-11-26 DIAGNOSIS — M1712 Unilateral primary osteoarthritis, left knee: Secondary | ICD-10-CM | POA: Diagnosis not present

## 2020-11-26 DIAGNOSIS — M25562 Pain in left knee: Secondary | ICD-10-CM | POA: Diagnosis not present

## 2020-11-26 MED ORDER — BUPIVACAINE HCL 0.5 % IJ SOLN
2.0000 mL | INTRAMUSCULAR | Status: AC | PRN
  Administered 2020-11-26: 2 mL via INTRA_ARTICULAR

## 2020-11-26 MED ORDER — METHYLPREDNISOLONE ACETATE 40 MG/ML IJ SUSP
40.0000 mg | INTRAMUSCULAR | Status: AC | PRN
  Administered 2020-11-26: 40 mg via INTRA_ARTICULAR

## 2020-11-26 MED ORDER — LIDOCAINE HCL 1 % IJ SOLN
2.0000 mL | INTRAMUSCULAR | Status: AC | PRN
  Administered 2020-11-26: 2 mL

## 2020-11-26 NOTE — Progress Notes (Signed)
Office Visit Note   Patient: Marcus [name]           Date of Birth: 05-18-1960           MRN: [id number] Visit Date: 11/26/2020              Requested by: Rema Fendt, NP [address] [place],  [place] [address] PCP: Rema Fendt, NP   Assessment & Plan: Visit Diagnoses:  1. Primary osteoarthritis of left knee     Plan: Based on findings impression is left knee joint effusion likely arthritis exacerbation.  Does not sound like a gout or pseudogout flareup or a meniscal tear.  Aspiration injection performed today.  We got about 25 cc of joint fluid.  This was sent to the lab.  Cortisone ministered as well.  Compression wrap applied.  Follow-up if he does not feel any improvement  Follow-Up Instructions: No follow-ups on file.   Orders:  Orders Placed This Encounter  Procedures  . XR KNEE 3 VIEW LEFT   No orders of the defined types were placed in this encounter.     Procedures: Large Joint Inj: L knee on 11/26/2020 11:31 AM Details: 22 G needle Medications: 2 mL bupivacaine 0.5 %; 2 mL lidocaine 1 %; 40 mg methylPREDNISolone acetate 40 MG/ML Outcome: tolerated well, no immediate complications Patient was prepped and draped in the usual sterile fashion.      Clinical Data: No additional findings.   Subjective: Chief Complaint  Patient presents with  . Left Knee - Pain    Marcus [name] is a 60 year old gentleman who works at [employer] who comes in for evaluation of chronic left knee pain for about 2 months.  Denies any injuries.  He feels pain in the back of the knee as well with an associated not.  Pain is worse with elevation.  Denies any mechanical symptoms.   Review of Systems  Constitutional: Negative.   All other systems reviewed and are negative.   Objective: Vital Signs: There were no vitals taken for this visit.  Physical Exam Vitals and nursing note reviewed.  Constitutional:      Appearance: He is well-developed.  HENT:      Head: Normocephalic and atraumatic.  Eyes:     Pupils: Pupils are equal, round, and reactive to light.  Pulmonary:     Effort: Pulmonary effort is normal.  Abdominal:     Palpations: Abdomen is soft.  Musculoskeletal:        General: Normal range of motion.     Cervical back: Neck supple.  Skin:    General: Skin is warm.  Neurological:     Mental Status: He is alert and oriented to person, place, and time.  Psychiatric:        Behavior: Behavior normal.        Thought Content: Thought content normal.        Judgment: Judgment normal.    Ortho Exam Left knee shows a large joint effusion with painful range of motion.  Otherwise exam is unremarkable.  Crepitus with range of motion.  Collaterals and cruciates are stable. Specialty Comments:  No specialty comments available.  Imaging: XR KNEE 3 VIEW LEFT  Result Date: 11/26/2020 Significant degenerative changes to the left knee with bone-on-bone medial compartment joint space narrowing    PMFS History: There are no problems to display for this patient.  Past Medical History:  Diagnosis Date  . Diabetes mellitus without complication (HCC)   .  Hypertension     Family History  Problem Relation Age of Onset  . Cancer Father     History reviewed. No pertinent surgical history. Social History   Occupational History  . Not on file  Tobacco Use  . Smoking status: Every Day    Types: Cigarettes  . Smokeless tobacco: Never  Vaping Use  . Vaping Use: Never used  Substance and Sexual Activity  . Alcohol use: Yes    Alcohol/week: 6.0 standard drinks    Types: 6 Standard drinks or equivalent per week  . Drug use: Yes    Types: Marijuana  . Sexual activity: Not on file

## 2020-11-27 LAB — SYNOVIAL FLUID ANALYSIS, COMPLETE
Basophils, %: 0 %
Eosinophils-Synovial: 0 % (ref 0–2)
Lymphocytes-Synovial Fld: 55 % (ref 0–74)
Monocyte/Macrophage: 25 % (ref 0–69)
Neutrophil, Synovial: 20 % (ref 0–24)
Synoviocytes, %: 0 % (ref 0–15)
WBC, Synovial: 67 cells/uL (ref <150)

## 2020-11-27 LAB — TIQ-NTM

## 2021-01-16 NOTE — Progress Notes (Signed)
Patient ID: Marcus [name], male    DOB: 1960/12/28  MRN: [id number]  CC: Diabetes Follow-Up   Subjective: Marcus [name] is a 60 y.o. male who presents for diabetes follow-up.  His concerns today include:   DIABETES TYPE 2 FOLLOW-UP: 11/04/2020: - Hemoglobin 7.8% on 09/27/2020. Next hemoglobin A1c due September 2022.  - Increase Metformin from 500 mg twice daily to 1000 mg twice daily.   01/20/2021: Doing well on current regimen. No side effects. No issues/concerns.  2. HYPERTENSION FOLLOW-UP: 10/02/2020: - Level of blood pressure control unknown as patient does not check at home.  - Continue Lisinopril as prescribed.   01/20/2021: Reports he isn't sure if he is taking blood pressure medication but thinks he is. Says he has two bottles of little white pills but cant remember if one is for blood pressure.   3. COUGH: Reports ongoing non-productive cough for at least 4 weeks. Endorses nasal congestion and runny nose. Reports cough worse during nighttime. Reports he tested positive for Covid 3 weeks ago at a local pharmacy and did not receive any treatment at that time.    There are no problems to display for this patient.    Current Outpatient Medications on File Prior to Visit  Medication Sig Dispense Refill   DULoxetine (CYMBALTA) 30 MG capsule Take 1 capsule (30 mg total) by mouth daily for 14 days, THEN 2 capsules (60 mg total) daily for 16 days. 46 capsule 0   indomethacin (INDOCIN) 50 MG capsule Take 50 mg by mouth 3 (three) times daily.     No current facility-administered medications on file prior to visit.    No Known Allergies  Social History   Socioeconomic History   Marital status: Single    Spouse name: Not on file   Number of children: Not on file   Years of education: Not on file   Highest education level: Not on file  Occupational History   Not on file  Tobacco Use   Smoking status: Every Day    Types: Cigarettes   Smokeless tobacco: Never  Vaping  Use   Vaping Use: Never used  Substance and Sexual Activity   Alcohol use: Yes    Alcohol/week: 6.0 standard drinks    Types: 6 Standard drinks or equivalent per week   Drug use: Yes    Types: Marijuana   Sexual activity: Not on file  Other Topics Concern   Not on file  Social History Narrative   Not on file   Social Determinants of Health   Financial Resource Strain: Not on file  Food Insecurity: Not on file  Transportation Needs: Not on file  Physical Activity: Not on file  Stress: Not on file  Social Connections: Not on file  Intimate Partner Violence: Not on file    Family History  Problem Relation Age of Onset   Cancer Father     History reviewed. No pertinent surgical history.  ROS: Review of Systems Negative except as stated above  PHYSICAL EXAM: BP (!) 177/88 (BP Location: Left Arm, Patient Position: Sitting, Cuff Size: Normal)   Pulse (!) 56   Temp 97.8 F (36.6 C)   Resp 16   Ht 5' 4.57" (1.64 m)   Wt 124 lb 9.6 oz (56.5 kg)   SpO2 97%   BMI 21.01 kg/m   Physical Exam HENT:     Head: Normocephalic and atraumatic.  Eyes:     Extraocular Movements: Extraocular movements intact.  Conjunctiva/sclera: Conjunctivae normal.     Pupils: Pupils are equal, round, and reactive to light.  Cardiovascular:     Rate and Rhythm: Bradycardia present.     Pulses: Normal pulses.     Heart sounds: Normal heart sounds.  Pulmonary:     Effort: Pulmonary effort is normal.     Breath sounds: Normal breath sounds.  Musculoskeletal:     Cervical back: Normal range of motion and neck supple.  Neurological:     General: No focal deficit present.     Mental Status: He is alert and oriented to person, place, and time.  Psychiatric:        Mood and Affect: Mood normal.        Behavior: Behavior normal.   Results for orders placed or performed in visit on 01/20/21  POCT glycosylated hemoglobin (Hb A1C)  Result Value Ref Range   Hemoglobin A1C 7.3 (A) 4.0 - 5.6 %    HbA1c POC (<> result, manual entry)     HbA1c, POC (prediabetic range)     HbA1c, POC (controlled diabetic range)      ASSESSMENT AND PLAN: 1. Type 2 diabetes mellitus without complication, without long-term current use of insulin ([facility]CC): - Hemoglobin A1c close to goal today at 7.3%, goal < 7%. This is decreased from previous hemoglobin A1c of 7.8% on 09/27/2020. Next hemoglobin A1c due December 2022. - Continue Metformin as prescribed.  - Discussed the importance of healthy eating habits, low-carbohydrate diet, low-sugar diet, regular aerobic exercise (at least 150 minutes a week as tolerated) and medication compliance to achieve or maintain control of diabetes. - Follow-up with primary provider in 3 months or sooner if needed.  - POCT glycosylated hemoglobin (Hb A1C) - metFORMIN (GLUCOPHAGE) 1000 MG tablet; Take 1 tablet (1,000 mg total) by mouth 2 (two) times daily with a meal.  Dispense: 180 tablet; Refill: 0  2. Essential hypertension: - Blood pressure not at goal during today's visit. Patient asymptomatic without chest pressure, chest pain, palpitations, shortness of breath, worst headache of life, and any additional red flag symptoms. - Patient reports he is unsure if he is taking anti-hypertension medication.  - Continue Lisinopril as prescribed.  - Counseled on blood pressure goal of less than 130/80, low-sodium, DASH diet, medication compliance, 150 minutes of moderate intensity exercise per week as tolerated. Discussed medication compliance, adverse effects. - Follow-up with primary provider in 2 weeks or sooner if needed. Asked patient to bring his prescriptions to next appointment, patient agreeable.  - lisinopril (ZESTRIL) 10 MG tablet; Take 1 tablet (10 mg total) by mouth daily.  Dispense: 90 tablet; Refill: 0  3. Hyperlipidemia, unspecified hyperlipidemia type: -Practice low-fat heart healthy diet and at least 150 minutes of moderate intensity exercise weekly as tolerated.  -  Continue Atorvastatin as prescribed.  - Follow-up with primary provider as scheduled.  - atorvastatin (LIPITOR) 20 MG tablet; Take 1 tablet (20 mg total) by mouth daily.  Dispense: 180 tablet; Refill: 0  4. Chronic cough: - Begin Benzonatate capsules as prescribed.  - Diagnostic chest x-ray for further evaluation.  - Follow-up with primary provider as scheduled.  - DG Chest 2 View; Future - benzonatate (TESSALON) 100 MG capsule; Take 1 capsule (100 mg total) by mouth 2 (two) times daily as needed for cough.  Dispense: 20 capsule; Refill: 0    Patient was given the opportunity to ask questions.  Patient verbalized understanding of the plan and was able to repeat key elements of the  plan. Patient was given clear instructions to go to Emergency Department or return to medical center if symptoms don't improve, worsen, or new problems develop.The patient verbalized understanding.   Orders Placed This Encounter  Procedures   DG Chest 2 View   POCT glycosylated hemoglobin (Hb A1C)     Requested Prescriptions   Signed Prescriptions Disp Refills   benzonatate (TESSALON) 100 MG capsule 20 capsule 0    Sig: Take 1 capsule (100 mg total) by mouth 2 (two) times daily as needed for cough.   metFORMIN (GLUCOPHAGE) 1000 MG tablet 180 tablet 0    Sig: Take 1 tablet (1,000 mg total) by mouth 2 (two) times daily with a meal.   lisinopril (ZESTRIL) 10 MG tablet 90 tablet 0    Sig: Take 1 tablet (10 mg total) by mouth daily.   atorvastatin (LIPITOR) 20 MG tablet 180 tablet 0    Sig: Take 1 tablet (20 mg total) by mouth daily.    Return in about 3 months (around 04/21/2021) for Follow-Up or next available diabetes and 2 weeks hypertension .  Rema Fendt, NP

## 2021-01-20 ENCOUNTER — Other Ambulatory Visit: Payer: Self-pay

## 2021-01-20 ENCOUNTER — Ambulatory Visit (INDEPENDENT_AMBULATORY_CARE_PROVIDER_SITE_OTHER): Payer: 59

## 2021-01-20 ENCOUNTER — Encounter: Payer: Self-pay | Admitting: Family

## 2021-01-20 ENCOUNTER — Ambulatory Visit (INDEPENDENT_AMBULATORY_CARE_PROVIDER_SITE_OTHER): Payer: 59 | Admitting: Family

## 2021-01-20 VITALS — BP 177/88 | HR 56 | Temp 97.8°F | Resp 16 | Ht 64.57 in | Wt 124.6 lb

## 2021-01-20 DIAGNOSIS — R053 Chronic cough: Secondary | ICD-10-CM

## 2021-01-20 DIAGNOSIS — E785 Hyperlipidemia, unspecified: Secondary | ICD-10-CM

## 2021-01-20 DIAGNOSIS — I10 Essential (primary) hypertension: Secondary | ICD-10-CM | POA: Diagnosis not present

## 2021-01-20 DIAGNOSIS — E119 Type 2 diabetes mellitus without complications: Secondary | ICD-10-CM

## 2021-01-20 LAB — POCT GLYCOSYLATED HEMOGLOBIN (HGB A1C): Hemoglobin A1C: 7.3 % — AB (ref 4.0–5.6)

## 2021-01-20 MED ORDER — ATORVASTATIN CALCIUM 20 MG PO TABS: 20.0000 mg | ORAL_TABLET | Freq: Every day | ORAL | 0 refills | Status: DC

## 2021-01-20 MED ORDER — METFORMIN HCL 1000 MG PO TABS: 1000.0000 mg | ORAL_TABLET | Freq: Two times a day (BID) | ORAL | 0 refills | Status: DC

## 2021-01-20 MED ORDER — BENZONATATE 100 MG PO CAPS
100.0000 mg | ORAL_CAPSULE | Freq: Two times a day (BID) | ORAL | 0 refills | Status: DC | PRN
Start: 2021-01-20 — End: 2021-02-24

## 2021-01-20 MED ORDER — LISINOPRIL 10 MG PO TABS: 10.0000 mg | ORAL_TABLET | Freq: Every day | ORAL | 0 refills | Status: DC

## 2021-01-20 NOTE — Progress Notes (Signed)
Pt presents for diabetes follow-up, pt reports pain in left leg been going on for approx 2 months

## 2021-01-20 NOTE — Patient Instructions (Addendum)
Diabetes Mellitus and Nutrition, Adult When you have diabetes, or diabetes mellitus, it is very important to have healthy eating habits because your blood sugar (glucose) levels are greatly affected by what you eat and drink. Eating healthy foods in the right amounts, at about the same times every day, can help you:  Control your blood glucose.  Lower your risk of heart disease.  Improve your blood pressure.  Reach or maintain a healthy weight. What can affect my meal plan? Every person with diabetes is different, and each person has different needs for a meal plan. Your health care provider may recommend that you work with a dietitian to make a meal plan that is best for you. Your meal plan may vary depending on factors such as:  The calories you need.  The medicines you take.  Your weight.  Your blood glucose, blood pressure, and cholesterol levels.  Your activity level.  Other health conditions you have, such as heart or kidney disease. How do carbohydrates affect me? Carbohydrates, also called carbs, affect your blood glucose level more than any other type of food. Eating carbs naturally raises the amount of glucose in your blood. Carb counting is a method for keeping track of how many carbs you eat. Counting carbs is important to keep your blood glucose at a healthy level, especially if you use insulin or take certain oral diabetes medicines. It is important to know how many carbs you can safely have in each meal. This is different for every person. Your dietitian can help you calculate how many carbs you should have at each meal and for each snack. How does alcohol affect me? Alcohol can cause a sudden decrease in blood glucose (hypoglycemia), especially if you use insulin or take certain oral diabetes medicines. Hypoglycemia can be a life-threatening condition. Symptoms of hypoglycemia, such as sleepiness, dizziness, and confusion, are similar to symptoms of having too much  alcohol.  Do not drink alcohol if: ? Your health care provider tells you not to drink. ? You are pregnant, may be pregnant, or are planning to become pregnant.  If you drink alcohol: ? Do not drink on an empty stomach. ? Limit how much you use to:  0-1 drink a day for women.  0-2 drinks a day for men. ? Be aware of how much alcohol is in your drink. In the [place] one drink equals one 12 oz bottle of beer (355 mL), one 5 oz glass of wine (148 mL), or one 1 oz glass of hard liquor (44 mL). ? Keep yourself hydrated with water, diet soda, or unsweetened iced tea.  Keep in mind that regular soda, juice, and other mixers may contain a lot of sugar and must be counted as carbs. What are tips for following this plan? Reading food labels  Start by checking the serving size on the "Nutrition Facts" label of packaged foods and drinks. The amount of calories, carbs, fats, and other nutrients listed on the label is based on one serving of the item. Many items contain more than one serving per package.  Check the total grams (g) of carbs in one serving. You can calculate the number of servings of carbs in one serving by dividing the total carbs by 15. For example, if a food has 30 g of total carbs per serving, it would be equal to 2 servings of carbs.  Check the number of grams (g) of saturated fats and trans fats in one serving. Choose foods that have   a low amount or none of these fats.  Check the number of milligrams (mg) of salt (sodium) in one serving. Most people should limit total sodium intake to less than 2,300 mg per day.  Always check the nutrition information of foods labeled as "low-fat" or "nonfat." These foods may be higher in added sugar or refined carbs and should be avoided.  Talk to your dietitian to identify your daily goals for nutrients listed on the label. Shopping  Avoid buying canned, pre-made, or processed foods. These foods tend to be high in fat, sodium, and added  sugar.  Shop around the outside edge of the grocery store. This is where you will most often find fresh fruits and vegetables, bulk grains, fresh meats, and fresh dairy. Cooking  Use low-heat cooking methods, such as baking, instead of high-heat cooking methods like deep frying.  Cook using healthy oils, such as olive, canola, or sunflower oil.  Avoid cooking with butter, cream, or high-fat meats. Meal planning  Eat meals and snacks regularly, preferably at the same times every day. Avoid going long periods of time without eating.  Eat foods that are high in fiber, such as fresh fruits, vegetables, beans, and whole grains. Talk with your dietitian about how many servings of carbs you can eat at each meal.  Eat 4-6 oz (112-168 g) of lean protein each day, such as lean meat, chicken, fish, eggs, or tofu. One ounce (oz) of lean protein is equal to: ? 1 oz (28 g) of meat, chicken, or fish. ? 1 egg. ?  cup (62 g) of tofu.  Eat some foods each day that contain healthy fats, such as avocado, nuts, seeds, and fish.   What foods should I eat? Fruits Berries. Apples. Oranges. Peaches. Apricots. Plums. Grapes. Mango. Papaya. Pomegranate. Kiwi. Cherries. Vegetables Lettuce. Spinach. Leafy greens, including kale, chard, collard greens, and mustard greens. Beets. Cauliflower. Cabbage. Broccoli. Carrots. Green beans. Tomatoes. Peppers. Onions. Cucumbers. Brussels sprouts. Grains Whole grains, such as whole-wheat or whole-grain bread, crackers, tortillas, cereal, and pasta. Unsweetened oatmeal. Quinoa. Brown or wild rice. Meats and other proteins Seafood. Poultry without skin. Lean cuts of poultry and beef. Tofu. Nuts. Seeds. Dairy Low-fat or fat-free dairy products such as milk, yogurt, and cheese. The items listed above may not be a complete list of foods and beverages you can eat. Contact a dietitian for more information. What foods should I avoid? Fruits Fruits canned with  syrup. Vegetables Canned vegetables. Frozen vegetables with butter or cream sauce. Grains Refined white flour and flour products such as bread, pasta, snack foods, and cereals. Avoid all processed foods. Meats and other proteins Fatty cuts of meat. Poultry with skin. Breaded or fried meats. Processed meat. Avoid saturated fats. Dairy Full-fat yogurt, cheese, or milk. Beverages Sweetened drinks, such as soda or iced tea. The items listed above may not be a complete list of foods and beverages you should avoid. Contact a dietitian for more information. Questions to ask a health care provider  Do I need to meet with a diabetes educator?  Do I need to meet with a dietitian?  What number can I call if I have questions?  When are the best times to check my blood glucose? Where to find more information:  American Diabetes Association: diabetes.org  Academy of Nutrition and Dietetics: www.eatright.org  National Institute of Diabetes and Digestive and Kidney Diseases: www.niddk.nih.gov  Association of Diabetes Care and Education Specialists: www.diabeteseducator.org Summary  It is important to have healthy eating   habits because your blood sugar (glucose) levels are greatly affected by what you eat and drink.  A healthy meal plan will help you control your blood glucose and maintain a healthy lifestyle.  Your health care provider may recommend that you work with a dietitian to make a meal plan that is best for you.  Keep in mind that carbohydrates (carbs) and alcohol have immediate effects on your blood glucose levels. It is important to count carbs and to use alcohol carefully. This information is not intended to replace advice given to you by your health care provider. Make sure you discuss any questions you have with your health care provider. Document Revised: 03/21/2019 Document Reviewed: 03/21/2019 Elsevier Patient Education  2021 Elsevier Inc.  

## 2021-01-21 ENCOUNTER — Other Ambulatory Visit: Payer: Self-pay | Admitting: Family

## 2021-01-21 DIAGNOSIS — J449 Chronic obstructive pulmonary disease, unspecified: Secondary | ICD-10-CM

## 2021-01-21 NOTE — Progress Notes (Signed)
Please call patient with update.   Chest x-ray likely related to COPD. Referral to Pulmonology for further evaluation and management. Their office should call patient within 2 weeks with appointment details.

## 2021-01-21 NOTE — Progress Notes (Signed)
Chest x-ray likely related to COPD. Referral to Pulmonology for further evaluation and management. Their office should call patient within 2 weeks with appointment details.

## 2021-01-29 NOTE — Progress Notes (Signed)
Patient ID: Darrin Koman, male    DOB: Apr 18, 1961  MRN: [id number]  CC: Hypertension Follow-Up   Subjective: Marcus [name] is a 60 y.o. male who presents for hypertension follow-up.   His concerns today include:   HYPERTENSION FOLLOW-UP: 01/20/2021: - Patient reports he is unsure if he is taking anti-hypertension medication.  - Continue Lisinopril as prescribed.   02/03/2021: Patient presents today with blood pressure medication from home for provider review. Lisinopril is the only medication patient is taking for blood pressure management. Reports did not take today because he thought he was supposed to fast.   Current Outpatient Medications on File Prior to Visit  Medication Sig Dispense Refill   atorvastatin (LIPITOR) 20 MG tablet Take 1 tablet (20 mg total) by mouth daily. 180 tablet 0   benzonatate (TESSALON) 100 MG capsule Take 1 capsule (100 mg total) by mouth 2 (two) times daily as needed for cough. 20 capsule 0   indomethacin (INDOCIN) 50 MG capsule Take 50 mg by mouth 3 (three) times daily.     lisinopril (ZESTRIL) 10 MG tablet Take 1 tablet (10 mg total) by mouth daily. 90 tablet 0   metFORMIN (GLUCOPHAGE) 1000 MG tablet Take 1 tablet (1,000 mg total) by mouth 2 (two) times daily with a meal. 180 tablet 0   DULoxetine (CYMBALTA) 30 MG capsule Take 1 capsule (30 mg total) by mouth daily for 14 days, THEN 2 capsules (60 mg total) daily for 16 days. 46 capsule 0   No current facility-administered medications on file prior to visit.    No Known Allergies  Social History   Socioeconomic History   Marital status: Single    Spouse name: Not on file   Number of children: Not on file   Years of education: Not on file   Highest education level: Not on file  Occupational History   Not on file  Tobacco Use   Smoking status: Every Day    Types: Cigarettes   Smokeless tobacco: Never  Vaping Use   Vaping Use: Never used  Substance and Sexual Activity   Alcohol use:  Yes    Alcohol/week: 6.0 standard drinks    Types: 6 Standard drinks or equivalent per week   Drug use: Yes    Types: Marijuana   Sexual activity: Not on file  Other Topics Concern   Not on file  Social History Narrative   Not on file   Social Determinants of Health   Financial Resource Strain: Not on file  Food Insecurity: Not on file  Transportation Needs: Not on file  Physical Activity: Not on file  Stress: Not on file  Social Connections: Not on file  Intimate Partner Violence: Not on file    Family History  Problem Relation Age of Onset   Cancer Father     No past surgical history on file.  ROS: Review of Systems Negative except as stated above  PHYSICAL EXAM: BP (!) 146/84 (BP Location: Left Arm, Patient Position: Sitting, Cuff Size: Normal)   Pulse 64   Temp 98.4 F (36.9 C) (Oral)   Resp 16   Ht 5\' 4"  (1.626 m)   Wt 125 lb (56.7 kg)   SpO2 96%   BMI 21.46 kg/m   Physical Exam HENT:     Head: Normocephalic and atraumatic.  Eyes:     Extraocular Movements: Extraocular movements intact.     Conjunctiva/sclera: Conjunctivae normal.     Pupils: Pupils are equal, round, and reactive  to light.  Cardiovascular:     Rate and Rhythm: Normal rate and regular rhythm.     Pulses: Normal pulses.     Heart sounds: Normal heart sounds.  Pulmonary:     Effort: Pulmonary effort is normal.     Breath sounds: Normal breath sounds.  Musculoskeletal:     Cervical back: Normal range of motion and neck supple.  Neurological:     General: No focal deficit present.     Mental Status: He is alert and oriented to person, place, and time.  Psychiatric:        Mood and Affect: Mood normal.        Behavior: Behavior normal.    ASSESSMENT AND PLAN: 1. Essential hypertension: - Blood pressure not at goal during today's visit. Patient asymptomatic without chest pressure, chest pain, palpitations, shortness of breath, worst headache of life, and any additional red flag  symptoms. - Increase Lisinopril from 10 mg daily to 20 mg daily. No refills needed.  - Begin Amlodipine as prescribed.  - Counseled on blood pressure goal of less than 130/80, low-sodium, DASH diet, medication compliance, 150 minutes of moderate intensity exercise per week as tolerated. Discussed medication compliance, adverse effects. - Follow-up with primary provider in 2 weeks or sooner if needed.  - amLODipine (NORVASC) 5 MG tablet; Take 1 tablet (5 mg total) by mouth daily.  Dispense: 30 tablet; Refill: 0   Patient was given the opportunity to ask questions.  Patient verbalized understanding of the plan and was able to repeat key elements of the plan. Patient was given clear instructions to go to Emergency Department or return to medical center if symptoms don't improve, worsen, or new problems develop.The patient verbalized understanding.  Requested Prescriptions   Signed Prescriptions Disp Refills   amLODipine (NORVASC) 5 MG tablet 30 tablet 0    Sig: Take 1 tablet (5 mg total) by mouth daily.    Return in about 2 weeks (around 02/17/2021) for Follow-Up or next available hypertension .  Rema Fendt, NP

## 2021-02-03 ENCOUNTER — Other Ambulatory Visit: Payer: Self-pay

## 2021-02-03 ENCOUNTER — Encounter: Payer: Self-pay | Admitting: Family

## 2021-02-03 ENCOUNTER — Ambulatory Visit (INDEPENDENT_AMBULATORY_CARE_PROVIDER_SITE_OTHER): Payer: 59 | Admitting: Family

## 2021-02-03 VITALS — BP 146/84 | HR 64 | Temp 98.4°F | Resp 16 | Ht 64.0 in | Wt 125.0 lb

## 2021-02-03 DIAGNOSIS — I10 Essential (primary) hypertension: Secondary | ICD-10-CM

## 2021-02-03 MED ORDER — AMLODIPINE BESYLATE 5 MG PO TABS: 5.0000 mg | ORAL_TABLET | Freq: Every day | ORAL | 0 refills | Status: DC

## 2021-02-03 NOTE — Progress Notes (Signed)
F/u BP Has not taken medication today.

## 2021-02-06 ENCOUNTER — Telehealth: Payer: Self-pay | Admitting: Internal Medicine

## 2021-02-06 DIAGNOSIS — J449 Chronic obstructive pulmonary disease, unspecified: Secondary | ICD-10-CM

## 2021-02-06 NOTE — Telephone Encounter (Signed)
Patient returned call.  Patient is coming by office 02/07/21 for labs.  Cbc order placed.  Nothing further at this time.

## 2021-02-06 NOTE — Telephone Encounter (Signed)
Patient scheduled 02/18/21 with Dr. Celine Mans for COPD consult.  Per COPD protocol, cbc w/ diff is requested within past year.   Left detailed message on VM as appointment reminder and requesting Patient have cbc w/ diff before OV.

## 2021-02-07 ENCOUNTER — Ambulatory Visit (INDEPENDENT_AMBULATORY_CARE_PROVIDER_SITE_OTHER): Payer: 59 | Admitting: Orthopaedic Surgery

## 2021-02-07 ENCOUNTER — Telehealth: Payer: Self-pay

## 2021-02-07 ENCOUNTER — Other Ambulatory Visit: Payer: Self-pay

## 2021-02-07 ENCOUNTER — Encounter: Payer: Self-pay | Admitting: Orthopaedic Surgery

## 2021-02-07 VITALS — Ht 64.0 in | Wt 125.0 lb

## 2021-02-07 DIAGNOSIS — M1712 Unilateral primary osteoarthritis, left knee: Secondary | ICD-10-CM | POA: Diagnosis not present

## 2021-02-07 MED ORDER — TRAMADOL HCL 50 MG PO TABS: 50.0000 mg | ORAL_TABLET | Freq: Three times a day (TID) | ORAL | 2 refills | Status: DC | PRN

## 2021-02-07 MED ORDER — MELOXICAM 7.5 MG PO TABS: 7.5000 mg | ORAL_TABLET | Freq: Two times a day (BID) | ORAL | 2 refills | Status: DC | PRN

## 2021-02-07 NOTE — Telephone Encounter (Signed)
Please precert for left knee gel injections. This is a [employer], and patient has forms to fill out. Dr.Xu's patient. Thanks!

## 2021-02-07 NOTE — Telephone Encounter (Signed)
Noted  

## 2021-02-07 NOTE — Progress Notes (Signed)
Office Visit Note   Patient: Marcus [name]           Date of Birth: 05/03/1960           MRN: [id number] Visit Date: 02/07/2021              Requested by: Rema Fendt, NP [address] [place],  [place] [address] PCP: Rema Fendt, NP   Assessment & Plan: Visit Diagnoses:  1. Unilateral primary osteoarthritis, left knee     Plan: Impression is left knee advanced degenerative joint disease.  Today, we discussed viscosupplementation injection versus total knee arthroplasty.  We would like to first try injection before proceeding with surgery.  Due to his insurance, we have provided him with [employer] paperwork to fill out to try and aid with approval.  He will follow-up with [place] once approved.  In the meantime, have called in Mobic and tramadol to take for pain.  Call with concerns or questions.  This patient is diagnosed with osteoarthritis of the knee(s).    Radiographs show evidence of joint space narrowing, osteophytes, subchondral sclerosis and/or subchondral cysts.  This patient has knee pain which interferes with functional and activities of daily living.    This patient has experienced inadequate response, adverse effects and/or intolerance with conservative treatments such as acetaminophen, NSAIDS, topical creams, physical therapy or regular exercise, knee bracing and/or weight loss.   This patient has experienced inadequate response or has a contraindication to intra articular steroid injections for at least 3 months.   This patient is not scheduled to have a total knee replacement within 6 months of starting treatment with viscosupplementation.   Follow-Up Instructions: Return for once approved for viso inj.   Orders:  No orders of the defined types were placed in this encounter.  Meds ordered this encounter  Medications  . traMADol (ULTRAM) 50 MG tablet    Sig: Take 1 tablet (50 mg total) by mouth 3 (three) times daily as needed.    Dispense:   30 tablet    Refill:  2  . meloxicam (MOBIC) 7.5 MG tablet    Sig: Take 1 tablet (7.5 mg total) by mouth 2 (two) times daily as needed for up to 14 doses for pain.    Dispense:  60 tablet    Refill:  2      Procedures: Large Joint Inj: L knee on 02/08/2021 3:39 PM Details: 22 G needle Medications: 2 mL bupivacaine 0.5 %; 2 mL lidocaine 1 %; 40 mg methylPREDNISolone acetate 40 MG/ML Outcome: tolerated well, no immediate complications Patient was prepped and draped in the usual sterile fashion.      Clinical Data: No additional findings.   Subjective: Chief Complaint  Patient presents with  . Left Knee - Pain, Follow-up    HPI patient is a very pleasant 60 year old gentleman who comes in today with recurrent left knee pain and swelling.  He was seen in our office back in August where the left knee was aspirated and injected with cortisone.  He noticed about 1 days relief.  He continues to have pain primarily to the posterior medial aspect.  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  He has been taking several ibuprofens daily without relief.  No prior viscosupplementation injection.  Review of Systems as detailed in HPI.  All others reviewed and are negative.  Objective: Vital Signs: Ht 5\' 4"  (1.626 m)   Wt 125 lb (56.7 kg)   BMI 21.46 kg/m   Physical Exam well-developed well-nourished gentleman in no acute distress.  Alert and oriented x3.  Ortho Exam left knee exam shows a small to moderate sized effusion.  Moderate tenderness along the medial joint line.  Mild tenderness to the popliteal fossa.  Calf is soft and nontender.  He is neurovascular intact distally.  Specialty Comments:  No specialty comments available.  Imaging: No new imaging   PMFS History: There are no problems to display for this patient.  Past Medical History:  Diagnosis Date  . Diabetes mellitus without  complication (HCC)   . Hypertension     Family History  Problem Relation Age of Onset  . Cancer Father     History reviewed. No pertinent surgical history. Social History   Occupational History  . Not on file  Tobacco Use  . Smoking status: Every Day    Types: Cigarettes  . Smokeless tobacco: Never  Vaping Use  . Vaping Use: Never used  Substance and Sexual Activity  . Alcohol use: Yes    Alcohol/week: 6.0 standard drinks    Types: 6 Standard drinks or equivalent per week  . Drug use: Yes    Types: Marijuana  . Sexual activity: Not on file

## 2021-02-08 DIAGNOSIS — M1712 Unilateral primary osteoarthritis, left knee: Secondary | ICD-10-CM | POA: Diagnosis not present

## 2021-02-08 MED ORDER — BUPIVACAINE HCL 0.5 % IJ SOLN
2.0000 mL | INTRAMUSCULAR | Status: AC | PRN
  Administered 2021-02-08: 2 mL via INTRA_ARTICULAR

## 2021-02-08 MED ORDER — METHYLPREDNISOLONE ACETATE 40 MG/ML IJ SUSP
40.0000 mg | INTRAMUSCULAR | Status: AC | PRN
  Administered 2021-02-08: 40 mg via INTRA_ARTICULAR

## 2021-02-08 MED ORDER — LIDOCAINE HCL 1 % IJ SOLN
2.0000 mL | INTRAMUSCULAR | Status: AC | PRN
  Administered 2021-02-08: 2 mL

## 2021-02-18 ENCOUNTER — Other Ambulatory Visit: Payer: Self-pay

## 2021-02-18 ENCOUNTER — Ambulatory Visit (INDEPENDENT_AMBULATORY_CARE_PROVIDER_SITE_OTHER): Payer: 59 | Admitting: Internal Medicine

## 2021-02-18 ENCOUNTER — Encounter: Payer: Self-pay | Admitting: Internal Medicine

## 2021-02-18 VITALS — BP 102/54 | HR 73 | Temp 97.9°F | Ht 63.0 in | Wt 127.2 lb

## 2021-02-18 DIAGNOSIS — Z23 Encounter for immunization: Secondary | ICD-10-CM | POA: Diagnosis not present

## 2021-02-18 DIAGNOSIS — J439 Emphysema, unspecified: Secondary | ICD-10-CM

## 2021-02-18 DIAGNOSIS — F17200 Nicotine dependence, unspecified, uncomplicated: Secondary | ICD-10-CM

## 2021-02-18 DIAGNOSIS — J449 Chronic obstructive pulmonary disease, unspecified: Secondary | ICD-10-CM | POA: Diagnosis not present

## 2021-02-18 MED ORDER — ANORO ELLIPTA 62.5-25 MCG/ACT IN AEPB: 1.0000 | INHALATION_SPRAY | Freq: Every day | RESPIRATORY_TRACT | 5 refills | Status: DC

## 2021-02-18 MED ORDER — ALBUTEROL SULFATE HFA 108 (90 BASE) MCG/ACT IN AERS: 2.0000 | INHALATION_SPRAY | Freq: Four times a day (QID) | RESPIRATORY_TRACT | 5 refills | Status: DC | PRN

## 2021-02-18 NOTE — Progress Notes (Signed)
Marcus [name]    [id number]    [birth year]  Primary Care Physician:Stephens, Salomon Fick, NP  Referring Physician: Rema Fendt, NP [address] [place],  [place] [address] Reason for Consultation: shortness of breath Date of Consultation: 02/18/2021  Chief complaint:   Chief Complaint  Patient presents with   Consult    Pt. Is having a lot of coughing.      HPI: Marcus [name] is a 60 y.o. man who is referred for new patient evaluation of COPD.  Describes shortness of breath and coughing for the past couple of months. Had chest xray showing COPD. Referred for further evaluation.   Gets sob with exertion, smoking cigarettes, fumes at work. Also has chronic cough with mucus production no blood.  Never had pneumonia or bronchitis before.  Never been hospitalized for breathing.   Has been given tessalon capsules but didn't think they helped the coughing or breathing.   He does wear a mask at work over [employer] top, but sometimes that makes the breathing worse because he gets sweaty underneath it.   Denies fevers chills night sweats or weight loss.   Social history:  Occupation: work at [employer] over [employer]. Has always worked over [employer] . Works 3rd shift.  Exposures:lives at home with alone, no pets.  Smoking history: 1/2 ppd. Started smoking age 81. Also smokes marijuana and it's rolled in a cigarette. This is daily use.   Social History   Occupational History   Not on file  Tobacco Use   Smoking status: Every Day    Packs/day: 0.50    Years: 50.00    Pack years: 25.00    Types: Cigarettes   Smokeless tobacco: Never  Vaping Use   Vaping Use: Never used  Substance and Sexual Activity   Alcohol use: Yes    Alcohol/week: 6.0 standard drinks    Types: 6 Standard drinks or equivalent per week   Drug use: Yes    Types: Marijuana   Sexual activity: Not on file    Relevant family history:  Family History  Problem Relation Age of Onset    Cancer Father    Lung disease Neg Hx     Past Medical History:  Diagnosis Date   Diabetes mellitus without complication (HCC)    Hypertension     History reviewed. No pertinent surgical history.   Physical Exam: Blood pressure (!) 102/54, pulse 73, temperature 97.9 F (36.6 C), temperature source Oral, height 5\' 3"  (1.6 m), weight 127 lb 3.2 oz (57.7 kg), SpO2 97 %. Gen:      No acute distress ENT:  no nasal polyps, mucus membranes moist Lungs:    diminished, no wheezes or crackles, no increased work of breathing CV:         Regular rate and rhythm; no murmurs, rubs, or gallops.  No pedal edema Abd:      + bowel sounds; soft, non-tender; no distension MSK: no acute synovitis of DIP or PIP joints, no mechanics hands.  Skin:      Warm and dry; no rashes Neuro: normal speech, no focal facial asymmetry Psych: alert and oriented x3, normal mood and affect   Data Reviewed/Medical Decision Making:  Independent interpretation of tests: Imaging:  Review of patient's chest xray images revealed chronic interstitial changes. The patient's images have been independently reviewed by me.    PFTs:  No flowsheet data found.  Labs:  Lab Results  Component Value Date   WBC 8.7 11/04/2020   HGB 16.2 11/04/2020   HCT 46.9 11/04/2020   MCV 89 11/04/2020   PLT 162 11/04/2020     Immunization status:  Immunization History  Administered Date(s) Administered   Influenza,inj,Quad PF,6+ Mos 02/18/2021     I reviewed prior external note(s) from PCP  I reviewed the result(s) of the labs and imaging as noted above.   I have ordered PFT  Assessment:  COPD New diagnosis Tobacco use disorder  Plan/Recommendations: We discussed disease management and progression at length today regarding COPD. Precontemplative with smoking cessation - will readdress. Start Anoro. Start prn albuterol.  Obtain PFTs.   Flu shot today   Return to Care: Return in about 4 weeks (around  03/18/2021).  Durel Salts, MD Pulmonary and Critical Care Medicine [facility] Office:[phone]  CC: Rema Fendt, NP

## 2021-02-18 NOTE — Progress Notes (Signed)
The patient has been prescribed the inhaler anoro and albuterol. Inhaler technique was demonstrated to patient. The patient subsequently demonstrated correct technique.

## 2021-02-18 NOTE — Patient Instructions (Addendum)
Please schedule follow up scheduled with myself in 1 months.  If my schedule is not open yet, we will contact you with a reminder closer to that time.  Before your next visit I would like you to have:  Full set of PFTs - 45 minutes  Start taking anoro inhaler 1 puff once a day. This is the maintenance inhaler.   Take the albuterol rescue inhaler every 4 to 6 hours as needed for wheezing or shortness of breath. You can also take it 15 minutes before exercise or exertional activity. Side effects include heart racing or pounding, jitters or anxiety. If you have a history of an irregular heart rhythm, it can make this worse. Can also give some patients a hard time sleeping.  To inhale the aerosol using an inhaler, follow these steps:  Remove the protective dust cap from the end of the mouthpiece. If the dust cap was not placed on the mouthpiece, check the mouthpiece for dirt or other objects. Be sure that the canister is fully and firmly inserted in the mouthpiece. 2. If you are using the inhaler for the first time or if you have not used the inhaler in more than 14 days, you will need to prime it. You may also need to prime the inhaler if it has been dropped. Ask your pharmacist or check the manufacturer's information if this happens. To prime the inhaler, shake it well and then press down on the canister 4 times to release 4 sprays into the air, away from your face. Be careful not to get albuterol in your eyes. 3. Shake the inhaler well. 4. Breathe out as completely as possible through your mouth. 4. Hold the canister with the mouthpiece on the bottom, facing you and the canister pointing upward. Place the open end of the mouthpiece into your mouth. Close your lips tightly around the mouthpiece. 6. Breathe in slowly and deeply through the mouthpiece.At the same time, press down once on the container to spray the medication into your mouth. 7. Try to hold your breath for 10 seconds. remove the  inhaler, and breathe out slowly. 8. If you were told to use 2 puffs, wait 1 minute and then repeat steps 3-7. 9. Replace the protective cap on the inhaler. 10. Clean your inhaler regularly. Follow the manufacturer's directions carefully and ask your doctor or pharmacist if you have any questions about cleaning your inhaler.  Check the back of the inhaler to keep track of the total number of doses left on the inhaler.    Understanding COPD   What is COPD? COPD stands for chronic obstructive pulmonary (lung) disease. COPD is a general term used for several lung diseases.  COPD is an umbrella term and encompasses other  common diseases in this group like chronic bronchitis and emphysema. Chronic asthma may also be included in this group. While some patients with COPD have only chronic bronchitis or emphysema, most patients have a combination of both.  You might hear these terms used in exchange for one another.   COPD adds to the work of the heart. Diseased lungs may reduce the amount of oxygen that goes to the blood. High blood pressure in blood vessels from the heart to the lungs makes it difficult for the heart to pump. Lung disease can also cause the body to produce too many red blood cells which may make the blood thicker and harder to pump.   Patients who have COPD with low oxygen levels may develop  an enlarged heart (cor pulmonale). This condition weakens the heart and causes increased shortness of breath and swelling in the legs and feet.   Chronic bronchitis Chronic bronchitis is irritation and inflammation (swelling) of the lining in the bronchial tubes (air passages). The irritation causes coughing and an excess amount of mucus in the airways. The swelling makes it difficult to get air in and out of the lungs. The small, hair-like structures on the inside of the airways (called cilia) may be damaged by the irritation. The cilia are then unable to help clean mucus from the airways.   Bronchitis is generally considered to be chronic when you have: a productive cough (cough up mucus) and shortness of breath that lasts about 3 months or more each year for 2 or more years in a row. Your doctor may define chronic bronchitis differently.   Emphysema Emphysema is the destruction, or breakdown, of the walls of the alveoli (air sacs) located at the end of the bronchial tubes. The damaged alveoli are not able to exchange oxygen and carbon dioxide between the lungs and the blood. The bronchioles lose their elasticity and collapse when you exhale, trapping air in the lungs. The trapped air keeps fresh air and oxygen from entering the lungs.   Who is affected by COPD? Emphysema and chronic bronchitis affect approximately 16 million people in the [place], or close to 11 percent of the population.   Symptoms of COPD  Shortness of breath  Shortness of breath with mild exercise (walking, using the stairs, etc.)  Chronic, productive cough (with mucus)  A feeling of "tightness" in the chest  Wheezing   What causes COPD? The two primary causes of COPD are cigarette smoking and alpha1-antitrypsin (AAT) deficiency. Air pollution and occupational dusts may also contribute to COPD, especially when the person exposed to these substances is a cigarette smoker.  Cigarette smoke causes COPD by irritating the airways and creating inflammation that narrows the airways, making it more difficult to breathe. Cigarette smoke also causes the cilia to stop working properly so mucus and trapped particles are not cleaned from the airways. As a result, chronic cough and excess mucus production develop, leading to chronic bronchitis.  In some people, chronic bronchitis and infections can lead to destruction of the small airways, or emphysema.  AAT deficiency, an inherited disorder, can also lead to emphysema. Alpha antitrypsin (AAT) is a protective material produced in the liver and transported to the lungs  to help combat inflammation. When there is not enough of the chemical AAT, the body is no longer protected from an enzyme in the white blood cells.   How is COPD diagnosed?  To diagnose COPD, the physician needs to know: Do you smoke?  Have you had chronic exposure to dust or air pollutants?  Do other members of your family have lung disease?  Are you short of breath?  Do you get short of breath with exercise?  Do you have chronic cough and/or wheezing?  Do you cough up excess mucus?  To help with the diagnosis, the physician will conduct a thorough physical exam which includes:  Listening to your lungs and heart  Checking your blood pressure and pulse  Examining your nose and throat  Checking your feet and ankles for swelling   Laboratory and other tests Several laboratory and other tests are needed to confirm a diagnosis of COPD. These tests may include:  Chest X-ray to look for lung changes that could be caused by  COPD   Spirometry and pulmonary function tests (PFTs) to determine lung volume and air flow  Pulse oximetry to measure the saturation of oxygen in the blood  Arterial blood gases (ABGs) to determine the amount of oxygen and carbon dioxide in the blood  Exercise testing to determine if the oxygen level in the blood drops during exercise   Treatment In the beginning stages of COPD, there is minimal shortness of breath that may be noticed only during exercise. As the disease progresses, shortness of breath may worsen and you may need to wear an oxygen device.   To help control other symptoms of COPD, the following treatments and lifestyle changes may be prescribed.  Quitting smoking  Avoiding cigarette smoke and other irritants  Taking medications including: a. bronchodilators b. anti-inflammatory agents c. oxygen d. antibiotics  Maintaining a healthy diet  Following a structured exercise program such as pulmonary rehabilitation Preventing respiratory infections   Controlling stress   If your COPD progresses, you may be eligible to be evaluated for lung volume reduction surgery or lung transplantation. You may also be eligible to participate in certain clinical trials (research studies). Ask your health care providers about studies being conducted in your hospital.   What is the outlook? Although COPD can not be cured, its symptoms can be treated and your quality of life can be improved. Your prognosis or outlook for the future will depend on how well your lungs are functioning, your symptoms, and how well you respond to and follow your treatment plan.

## 2021-02-21 NOTE — Progress Notes (Signed)
Patient ID: Marcus [name], male    DOB: [birth year]  MRN: [id number]  CC: Hypertension Follow-Up   Subjective: Marcus [name] is a 60 y.o. male who presents for hypertension follow-up.   His concerns today include:   HYPERTENSION FOLLOW-UP: 02/03/2021: - Increase Lisinopril from 10 mg daily to 20 mg daily. No refills needed.  - Begin Amlodipine as prescribed.   02/24/2021: Doing well on current regimen. No issues/concerns. Denies shortness of breath and chest pain. Not checking blood pressure in the home setting.   Current Outpatient Medications on File Prior to Visit  Medication Sig Dispense Refill   albuterol (VENTOLIN HFA) 108 (90 Base) MCG/ACT inhaler Inhale 2 puffs into the lungs every 6 (six) hours as needed. 18 g 5   atorvastatin (LIPITOR) 20 MG tablet Take 1 tablet (20 mg total) by mouth daily. 180 tablet 0   lisinopril (ZESTRIL) 10 MG tablet Take 1 tablet (10 mg total) by mouth daily. 90 tablet 0   metFORMIN (GLUCOPHAGE) 1000 MG tablet Take 1 tablet (1,000 mg total) by mouth 2 (two) times daily with a meal. 180 tablet 0   traMADol (ULTRAM) 50 MG tablet Take 1 tablet (50 mg total) by mouth 3 (three) times daily as needed. 30 tablet 2   umeclidinium-vilanterol (ANORO ELLIPTA) 62.5-25 MCG/ACT AEPB Inhale 1 puff into the lungs daily. 1 each 5   No current facility-administered medications on file prior to visit.    No Known Allergies  Social History   Socioeconomic History   Marital status: Single    Spouse name: Not on file   Number of children: Not on file   Years of education: Not on file   Highest education level: Not on file  Occupational History   Not on file  Tobacco Use   Smoking status: Every Day    Packs/day: 0.50    Years: 50.00    Pack years: 25.00    Types: Cigarettes   Smokeless tobacco: Never  Vaping Use   Vaping Use: Never used  Substance and Sexual Activity   Alcohol use: Yes    Alcohol/week: 6.0 standard drinks    Types: 6 Standard drinks  or equivalent per week   Drug use: Yes    Types: Marijuana   Sexual activity: Not on file  Other Topics Concern   Not on file  Social History Narrative   Not on file   Social Determinants of Health   Financial Resource Strain: Not on file  Food Insecurity: Not on file  Transportation Needs: Not on file  Physical Activity: Not on file  Stress: Not on file  Social Connections: Not on file  Intimate Partner Violence: Not on file    Family History  Problem Relation Age of Onset   Cancer Father    Lung disease Neg Hx     No past surgical history on file.  ROS: Review of Systems Negative except as stated above  PHYSICAL EXAM: BP 135/84 (BP Location: Left Arm, Patient Position: Sitting, Cuff Size: Normal)   Pulse 60   Temp 98.3 F (36.8 C)   Resp 16   Ht 5' 4.02" (1.626 m)   Wt 123 lb 9.6 oz (56.1 kg)   SpO2 96%   BMI 21.21 kg/m   Physical Exam HENT:     Head: Normocephalic and atraumatic.  Eyes:     Extraocular Movements: Extraocular movements intact.     Conjunctiva/sclera: Conjunctivae normal.     Pupils: Pupils are equal, round, and  reactive to light.  Cardiovascular:     Rate and Rhythm: Normal rate and regular rhythm.     Pulses: Normal pulses.     Heart sounds: Normal heart sounds.  Pulmonary:     Effort: Pulmonary effort is normal.     Breath sounds: Normal breath sounds.  Musculoskeletal:     Cervical back: Normal range of motion and neck supple.  Neurological:     General: No focal deficit present.     Mental Status: He is alert and oriented to person, place, and time.  Psychiatric:        Mood and Affect: Mood normal.        Behavior: Behavior normal.   ASSESSMENT AND PLAN: 1. Essential hypertension: - Continue Lisinopril as prescribed. No refills needed.  - Continue Amlodipine as prescribed.  - Counseled on blood pressure goal of less than 130/80, low-sodium, DASH diet, medication compliance, 150 minutes of moderate intensity exercise per  week as tolerated. Discussed medication compliance, adverse effects. - BMP to evaluate kidney function and electrolyte balance. - Follow-up with primary provider in 3 months or sooner if needed.  - Basic Metabolic Panel - amLODipine (NORVASC) 5 MG tablet; Take 1 tablet (5 mg total) by mouth daily.  Dispense: 90 tablet; Refill: 0    Patient was given the opportunity to ask questions.  Patient verbalized understanding of the plan and was able to repeat key elements of the plan. Patient was given clear instructions to go to Emergency Department or return to medical center if symptoms don't improve, worsen, or new problems develop.The patient verbalized understanding.   Orders Placed This Encounter  Procedures   Basic Metabolic Panel   Requested Prescriptions   Signed Prescriptions Disp Refills   amLODipine (NORVASC) 5 MG tablet 90 tablet 0    Sig: Take 1 tablet (5 mg total) by mouth daily.    Return in about 3 months (around 05/27/2021) for Follow-Up or next available hypertension .  Rema Fendt, NP

## 2021-02-24 ENCOUNTER — Encounter: Payer: Self-pay | Admitting: Family

## 2021-02-24 ENCOUNTER — Ambulatory Visit (INDEPENDENT_AMBULATORY_CARE_PROVIDER_SITE_OTHER): Payer: 59 | Admitting: Family

## 2021-02-24 ENCOUNTER — Other Ambulatory Visit: Payer: Self-pay

## 2021-02-24 VITALS — BP 135/84 | HR 60 | Temp 98.3°F | Resp 16 | Ht 64.02 in | Wt 123.6 lb

## 2021-02-24 DIAGNOSIS — I10 Essential (primary) hypertension: Secondary | ICD-10-CM

## 2021-02-24 MED ORDER — AMLODIPINE BESYLATE 5 MG PO TABS: 5.0000 mg | ORAL_TABLET | Freq: Every day | ORAL | 0 refills | Status: DC

## 2021-02-24 NOTE — Progress Notes (Signed)
Pt presents for hypertension follow-up, pt has not started Amlodipine

## 2021-02-25 LAB — BASIC METABOLIC PANEL
BUN/Creatinine Ratio: 8 — ABNORMAL LOW (ref 10–24)
BUN: 6 mg/dL — ABNORMAL LOW (ref 8–27)
CO2: 26 mmol/L (ref 20–29)
Calcium: 9.6 mg/dL (ref 8.6–10.2)
Chloride: 104 mmol/L (ref 96–106)
Creatinine, Ser: 0.77 mg/dL (ref 0.76–1.27)
Glucose: 97 mg/dL (ref 70–99)
Potassium: 4.9 mmol/L (ref 3.5–5.2)
Sodium: 140 mmol/L (ref 134–144)
eGFR: 102 mL/min/{1.73_m2} (ref >59)

## 2021-02-25 NOTE — Progress Notes (Signed)
Please call patient with update.   Kidney function normal.

## 2021-02-26 ENCOUNTER — Telehealth: Payer: Self-pay | Admitting: Family

## 2021-02-26 NOTE — Telephone Encounter (Signed)
Patient called saying that on his office visit on 10/31 he was going to get an Rx for Tramadol for pain.   Please f/u with patient.  If medication is prescribed patient would like Rx sent to [facility] on [address] In [place].

## 2021-02-27 NOTE — Telephone Encounter (Signed)
Tramadol (Ultram) ordered on 02/07/2021 by Jari Sportsman, PA at [facility]. Please request refills from the same.

## 2021-03-17 ENCOUNTER — Encounter: Payer: Self-pay | Admitting: Internal Medicine

## 2021-03-17 ENCOUNTER — Other Ambulatory Visit: Payer: Self-pay

## 2021-03-17 ENCOUNTER — Ambulatory Visit (INDEPENDENT_AMBULATORY_CARE_PROVIDER_SITE_OTHER): Payer: 59 | Admitting: Internal Medicine

## 2021-03-17 VITALS — BP 108/64 | HR 62 | Temp 98.1°F | Ht 64.0 in | Wt 126.4 lb

## 2021-03-17 DIAGNOSIS — J449 Chronic obstructive pulmonary disease, unspecified: Secondary | ICD-10-CM | POA: Diagnosis not present

## 2021-03-17 DIAGNOSIS — F17200 Nicotine dependence, unspecified, uncomplicated: Secondary | ICD-10-CM | POA: Diagnosis not present

## 2021-03-17 DIAGNOSIS — J439 Emphysema, unspecified: Secondary | ICD-10-CM

## 2021-03-17 MED ORDER — ANORO ELLIPTA 62.5-25 MCG/ACT IN AEPB: 1.0000 | INHALATION_SPRAY | Freq: Every day | RESPIRATORY_TRACT | 5 refills | Status: DC
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
## 2021-03-17 NOTE — Patient Instructions (Addendum)
Please schedule follow up scheduled with myself in 3 months.  If my schedule is not open yet, we will contact you with a reminder closer to that time.  I want you to have some breathing testing done - we need proof of your covid vaccination right now - if you can find that we can do the breathing testing next time.   Keep taking the anoro inhaler.

## 2021-04-22 ENCOUNTER — Ambulatory Visit: Payer: 59 | Admitting: Family

## 2021-05-22 NOTE — Progress Notes (Signed)
Patient ID: Marcus [name] Vanschaick, male    DOB: 01/06/1961  MRN: [id number]  CC: Hypertension Follow-Up  Subjective: Marcus [name] Manna is a 61 y.o. male who presents for hypertension follow-up.   His concerns today include:  HYPERTENSION FOLLOW-UP: 02/24/2021: - Continue Lisinopril as prescribed. No refills needed.  - Continue Amlodipine as prescribed.   05/26/2021: Doing well on current regimen. No side effects. No issues/concerns. Denies chest pain and shortness of breath.   2. DIABETES TYPE 2 FOLLOW-UP: 01/20/2021: - Hemoglobin A1c close to goal today at 7.3%, goal < 7%. This is decreased from previous hemoglobin A1c of 7.8% on 09/27/2020. Next hemoglobin A1c due December 2022. - Continue Metformin as prescribed.   05/26/2021: Doing well on current regimen. No issues/concerns.   3. HIGH CHOLESTEROL FOLLOW-UP: 01/20/2021: - Continue Atorvastatin as prescribed.   05/26/2021: Doing well on current regimen. No issues/concerns.   4. LEFT KNEE OSTEOARTHRITIS: Asking for contact information with established Gershon MusselNaiping Xu, MD. Plans to schedule appointment soon.    Current Outpatient Medications on File Prior to Visit  Medication Sig Dispense Refill   albuterol (VENTOLIN HFA) 108 (90 Base) MCG/ACT inhaler Inhale 2 puffs into the lungs every 6 (six) hours as needed. 18 g 5   traMADol (ULTRAM) 50 MG tablet Take 1 tablet (50 mg total) by mouth 3 (three) times daily as needed. 30 tablet 2   umeclidinium-vilanterol (ANORO ELLIPTA) 62.5-25 MCG/ACT AEPB Inhale 1 puff into the lungs daily. 1 each 5   No current facility-administered medications on file prior to visit.    No Known Allergies  Social History   Socioeconomic History   Marital status: Single    Spouse name: Not on file   Number of children: Not on file   Years of education: Not on file   Highest education level: Not on file  Occupational History   Not on file  Tobacco Use   Smoking status: Every Day    Packs/day: 0.50     Years: 50.00    Pack years: 25.00    Types: Cigarettes    Start date: 1969   Smokeless tobacco: Never   Tobacco comments:    0.5ppd as of 03/17/21 ep  Vaping Use   Vaping Use: Never used  Substance and Sexual Activity   Alcohol use: Yes    Alcohol/week: 6.0 standard drinks    Types: 6 Standard drinks or equivalent per week   Drug use: Yes    Types: Marijuana   Sexual activity: Not on file  Other Topics Concern   Not on file  Social History Narrative   Not on file   Social Determinants of Health   Financial Resource Strain: Not on file  Food Insecurity: Not on file  Transportation Needs: Not on file  Physical Activity: Not on file  Stress: Not on file  Social Connections: Not on file  Intimate Partner Violence: Not on file    Family History  Problem Relation Age of Onset   Cancer Father    Lung disease Neg Hx     No past surgical history on file.  ROS: Review of Systems Negative except as stated above  PHYSICAL EXAM: BP 136/63 (BP Location: Left Arm, Patient Position: Sitting, Cuff Size: Normal)    Pulse 63    Temp 98 F (36.7 C)    Resp 18    Ht 5' 4.02" (1.626 m)    Wt 129 lb (58.5 kg)    SpO2 98%    BMI 22.13  kg/m   Physical Exam HENT:     Head: Normocephalic and atraumatic.     Right Ear: Tympanic membrane, ear canal and external ear normal.     Left Ear: Tympanic membrane, ear canal and external ear normal.  Eyes:     Extraocular Movements: Extraocular movements intact.     Conjunctiva/sclera: Conjunctivae normal.     Pupils: Pupils are equal, round, and reactive to light.  Cardiovascular:     Rate and Rhythm: Normal rate and regular rhythm.     Pulses: Normal pulses.     Heart sounds: Normal heart sounds.  Pulmonary:     Effort: Pulmonary effort is normal.     Breath sounds: Normal breath sounds.  Musculoskeletal:     Cervical back: Normal range of motion and neck supple.  Neurological:     General: No focal deficit present.     Mental Status:  He is alert and oriented to person, place, and time.  Psychiatric:        Mood and Affect: Mood normal.        Behavior: Behavior normal.   Results for orders placed or performed in visit on 05/26/21  POCT glycosylated hemoglobin (Hb A1C)  Result Value Ref Range   Hemoglobin A1C 7.3 (A) 4.0 - 5.6 %   HbA1c POC (<> result, manual entry)     HbA1c, POC (prediabetic range)     HbA1c, POC (controlled diabetic range)      ASSESSMENT AND PLAN: 1. Essential hypertension: - Continue Amlodipine and Lisinopril as prescribed.  - Counseled on blood pressure goal of less than 130/80, low-sodium, DASH diet, medication compliance, 150 minutes of moderate intensity exercise per week as tolerated. Discussed medication compliance, adverse effects. - Follow-up with primary provider in 3 months or sooner if needed.  - amLODipine (NORVASC) 5 MG tablet; Take 1 tablet (5 mg total) by mouth daily.  Dispense: 90 tablet; Refill: 0 - lisinopril (ZESTRIL) 10 MG tablet; Take 1 tablet (10 mg total) by mouth daily.  Dispense: 90 tablet; Refill: 0  2. Type 2 diabetes mellitus without complication, without long-term current use of insulin (HCC): - Hemoglobin A1c today not at goal at 7.3%, goal < 7%. This is the same as previous 7.3% on 01/20/2021. - Continue Metformin as prescribed.  - Begin Dapagliflozin Propanediol as prescribed. Discussed medication compliance and adverse effects.  - Discussed the importance of healthy eating habits, low-carbohydrate diet, low-sugar diet, regular aerobic exercise (at least 150 minutes a week as tolerated) and medication compliance to achieve or maintain control of diabetes. - Follow-up with primary provider in 4 weeks or sooner if needed.  - POCT glycosylated hemoglobin (Hb A1C) - dapagliflozin propanediol (FARXIGA) 5 MG TABS tablet; Take 1 tablet (5 mg total) by mouth daily before breakfast.  Dispense: 30 tablet; Refill: 0 - metFORMIN (GLUCOPHAGE) 1000 MG tablet; Take 1 tablet  (1,000 mg total) by mouth 2 (two) times daily with a meal.  Dispense: 180 tablet; Refill: 0  3. Hyperlipidemia, unspecified hyperlipidemia type: - Continue Atorvastatin as prescribed.  - Follow-up with primary provider as scheduled.  - atorvastatin (LIPITOR) 20 MG tablet; Take 1 tablet (20 mg total) by mouth daily.  Dispense: 180 tablet; Refill: 0  4. Primary osteoarthritis of left knee: - Keep all scheduled appointments with Gershon Mussel, MD at  Orthopedics.   Patient was given the opportunity to ask questions.  Patient verbalized understanding of the plan and was able to repeat key elements of the plan. Patient  was given clear instructions to go to Emergency Department or return to medical center if symptoms don't improve, worsen, or new problems develop.The patient verbalized understanding.   Orders Placed This Encounter  Procedures   POCT glycosylated hemoglobin (Hb A1C)     Requested Prescriptions   Signed Prescriptions Disp Refills   dapagliflozin propanediol (FARXIGA) 5 MG TABS tablet 30 tablet 0    Sig: Take 1 tablet (5 mg total) by mouth daily before breakfast.   amLODipine (NORVASC) 5 MG tablet 90 tablet 0    Sig: Take 1 tablet (5 mg total) by mouth daily.   lisinopril (ZESTRIL) 10 MG tablet 90 tablet 0    Sig: Take 1 tablet (10 mg total) by mouth daily.   atorvastatin (LIPITOR) 20 MG tablet 180 tablet 0    Sig: Take 1 tablet (20 mg total) by mouth daily.   metFORMIN (GLUCOPHAGE) 1000 MG tablet 180 tablet 0    Sig: Take 1 tablet (1,000 mg total) by mouth 2 (two) times daily with a meal.    Return in about 4 weeks (around 06/23/2021) for Telephone Visit diabets, Follow-Up or next available 3 months hypertension .  Rema Fendt, NP

## 2021-05-26 ENCOUNTER — Ambulatory Visit (INDEPENDENT_AMBULATORY_CARE_PROVIDER_SITE_OTHER): Payer: 59 | Admitting: Family

## 2021-05-26 ENCOUNTER — Other Ambulatory Visit: Payer: Self-pay

## 2021-05-26 VITALS — BP 136/63 | HR 63 | Temp 98.0°F | Resp 18 | Ht 64.02 in | Wt 129.0 lb

## 2021-05-26 DIAGNOSIS — E119 Type 2 diabetes mellitus without complications: Secondary | ICD-10-CM | POA: Diagnosis not present

## 2021-05-26 DIAGNOSIS — M1712 Unilateral primary osteoarthritis, left knee: Secondary | ICD-10-CM

## 2021-05-26 DIAGNOSIS — I10 Essential (primary) hypertension: Secondary | ICD-10-CM

## 2021-05-26 DIAGNOSIS — E785 Hyperlipidemia, unspecified: Secondary | ICD-10-CM | POA: Diagnosis not present

## 2021-05-26 LAB — POCT GLYCOSYLATED HEMOGLOBIN (HGB A1C): Hemoglobin A1C: 7.3 % — AB (ref 4.0–5.6)

## 2021-05-26 MED ORDER — DAPAGLIFLOZIN PROPANEDIOL 5 MG PO TABS: 5.0000 mg | ORAL_TABLET | Freq: Every day | ORAL | 0 refills | Status: DC

## 2021-05-26 MED ORDER — ATORVASTATIN CALCIUM 20 MG PO TABS: 20.0000 mg | ORAL_TABLET | Freq: Every day | ORAL | 0 refills | Status: DC

## 2021-05-26 MED ORDER — METFORMIN HCL 1000 MG PO TABS: 1000.0000 mg | ORAL_TABLET | Freq: Two times a day (BID) | ORAL | 0 refills | Status: DC

## 2021-05-26 MED ORDER — LISINOPRIL 10 MG PO TABS: 10.0000 mg | ORAL_TABLET | Freq: Every day | ORAL | 0 refills | Status: DC

## 2021-05-26 MED ORDER — AMLODIPINE BESYLATE 5 MG PO TABS: 5.0000 mg | ORAL_TABLET | Freq: Every day | ORAL | 0 refills | Status: DC

## 2021-05-26 NOTE — Progress Notes (Signed)
Pt presents for hypertension and diabetes follow-up, pt states that knot has came back on the back left leg and causing pain Needs refill on amlodipine and atorvastatin

## 2021-05-26 NOTE — Progress Notes (Signed)
Diabetes discussed in office.

## 2021-06-02 ENCOUNTER — Ambulatory Visit: Payer: Self-pay | Admitting: Internal Medicine

## 2021-06-10 ENCOUNTER — Other Ambulatory Visit: Payer: Self-pay

## 2021-06-10 ENCOUNTER — Ambulatory Visit (INDEPENDENT_AMBULATORY_CARE_PROVIDER_SITE_OTHER): Payer: 59

## 2021-06-10 ENCOUNTER — Encounter: Payer: Self-pay | Admitting: Orthopaedic Surgery

## 2021-06-10 ENCOUNTER — Ambulatory Visit (INDEPENDENT_AMBULATORY_CARE_PROVIDER_SITE_OTHER): Payer: 59 | Admitting: Orthopaedic Surgery

## 2021-06-10 DIAGNOSIS — M1712 Unilateral primary osteoarthritis, left knee: Secondary | ICD-10-CM | POA: Diagnosis not present

## 2021-06-10 DIAGNOSIS — R202 Paresthesia of skin: Secondary | ICD-10-CM

## 2021-06-10 DIAGNOSIS — M25552 Pain in left hip: Secondary | ICD-10-CM

## 2021-06-10 MED ORDER — TRAMADOL HCL 50 MG PO TABS: 50.0000 mg | ORAL_TABLET | Freq: Two times a day (BID) | ORAL | 2 refills | Status: DC | PRN

## 2021-06-10 MED ORDER — PREDNISONE 5 MG (21) PO TBPK: ORAL_TABLET | ORAL | 0 refills | Status: DC

## 2021-06-10 NOTE — Progress Notes (Signed)
Office Visit Note   Patient: Marcus [name]           Date of Birth: Nov 17, 1960           MRN: [id number] Visit Date: 06/10/2021              Requested by: Camillia Herter, NP [address],  [place] [address] PCP: Camillia Herter, NP   Assessment & Plan: Visit Diagnoses:  1. Primary osteoarthritis of left knee   2. Left hand paresthesia   3. Pain in left hip     Plan: Impression is left knee osteoarthritis, left hip osteoarthritis and questionable hernia and left hand paresthesias.  In regards to the knee, he has only sustained temporary relief from cortisone injections.  He is not interested in viscosupplementation injections.  He would like to proceed with left total knee arthroplasty.  Risk, benefits and possible complications reviewed.  Rehab recovery time discussed.  All questions were answered.  We will obtain a new hemoglobin A1c today and as long as it is under 7.8 we will have Debbie contact the patient to schedule surgery.  In regards to his left hip, he does have underlying osteoarthritis so we have referred him to Dr. Ernestina Patches for cortisone injection.  In regards to the bulge, I would like to refer him back to his primary care provider.  For the left hand paresthesias, it is hard to tell what this is actually coming from but we will go ahead and order a nerve conduction study/EMG.  He will follow-up with [place] once completed.  Have called in a small steroid taper to take in the meantime.  Follow-Up Instructions: Return for after NCS/EMG LUE.   Orders:  Orders Placed This Encounter  Procedures   XR HIP UNILAT W OR W/O PELVIS 2-3 VIEWS LEFT   XR KNEE 3 VIEW LEFT   HgB A1c   Ambulatory referral to Physical Medicine Rehab   Ambulatory referral to Physical Medicine Rehab   Meds ordered this encounter  Medications   traMADol (ULTRAM) 50 MG tablet    Sig: Take 1 tablet (50 mg total) by mouth every 12 (twelve) hours as needed.    Dispense:  30 tablet    Refill:   2   predniSONE (STERAPRED UNI-PAK 21 TAB) 5 MG (21) TBPK tablet    Sig: Take as directed    Dispense:  21 tablet    Refill:  0      Procedures: No procedures performed   Clinical Data: No additional findings.   Subjective: Chief Complaint  Patient presents with   Left Knee - Pain   Left Hand - Pain   Left Hip - Pain    HPI patient is a pleasant 61 year old gentleman who comes in today with chronic left knee pain, in addition to burning to the left hand and new onset left hip pain and bulging.  In regards to the left knee, he has an underlying history of osteoarthritis.  He was seen in our office in February 05, 2021 where left knee was injected with cortisone.  He had significant relief but unfortunately this only lasted for a few days.  His pain has returned and has continued to worsen.  It is causing him to walk with an altered gait.  The other issue he brings up today is a bulge superior to the left groin area.  He denies any pain to the actual groin.  He notes that his bulge has  increased in size.  He has not seen his primary care doctor for this.  He is also complaining of left hand burning.  This occurs primarily to the dorsum of the hand.  He works as a Training and development officer at [employer] and the symptoms started after changing to a new type of gloves.  He denies any paresthesias to his fingers.  No weakness.  Review of Systems as detailed in HPI.  All others reviewed and are negative.   Objective: Vital Signs: There were no vitals taken for this visit.  Physical Exam well-developed well-nourished gentleman in no acute distress.  Alert and oriented x3.  Ortho Exam left knee exam shows a trace effusion.  Range of motion 0 to 95 degrees.  Medial joint line tenderness.  He is neurovascular intact distally.  Left hip exam shows negative logroll and negative FADIR.  No focal weakness.  Left hand exam shows slight decrease sensation to the dorsal aspect.  Negative Phalen and negative Tinel.  No  thenar atrophy.  He is neurovascular tact distally.  Specialty Comments:  No specialty comments available.  Imaging: XR HIP UNILAT W OR W/O PELVIS 2-3 VIEWS LEFT  Result Date: 06/10/2021 X-rays demonstrate moderate joint space narrowing  XR KNEE 3 VIEW LEFT  Result Date: 06/10/2021 X-rays demonstrate advanced degenerative changes to the medial and patellofemoral compartments    PMFS History: There are no problems to display for this patient.  Past Medical History:  Diagnosis Date   Diabetes mellitus without complication ([place])    Hypertension     Family History  Problem Relation Age of Onset   Cancer Father    Lung disease Neg Hx     History reviewed. No pertinent surgical history. Social History   Occupational History   Not on file  Tobacco Use   Smoking status: Every Day    Packs/day: 0.50    Years: 50.00    Pack years: 25.00    Types: Cigarettes    Start date: 1969   Smokeless tobacco: Never   Tobacco comments:    0.5ppd as of 03/17/21 ep  Vaping Use   Vaping Use: Never used  Substance and Sexual Activity   Alcohol use: Yes    Alcohol/week: 6.0 standard drinks    Types: 6 Standard drinks or equivalent per week   Drug use: Yes    Types: Marijuana   Sexual activity: Not on file

## 2021-06-11 LAB — HEMOGLOBIN A1C
Hgb A1c MFr Bld: 7.3 % of total Hgb — ABNORMAL HIGH (ref <5.7)
Mean Plasma Glucose: 163 mg/dL
eAG (mmol/L): 9 mmol/L

## 2021-06-11 NOTE — Progress Notes (Signed)
Ok for surgery

## 2021-06-17 ENCOUNTER — Other Ambulatory Visit: Payer: Self-pay

## 2021-06-20 NOTE — Progress Notes (Signed)
Virtual Visit via Telephone Note  I connected with Regina Blancett, on 06/23/2021 at 1:43 PM by telephone due to the COVID-19 pandemic and verified that I am speaking with the correct person using two identifiers.  Due to current restrictions/limitations of in-office visits due to the COVID-19 pandemic, this scheduled clinical appointment was converted to a telehealth visit.   Consent: I discussed the limitations, risks, security and privacy concerns of performing an evaluation and management service by telephone and the availability of in person appointments. I also discussed with the patient that there may be a patient responsible charge related to this service. The patient expressed understanding and agreed to proceed.   Location of Patient: Home  Location of Provider: [facility] [place] participating in Telemedicine visit: Kenya Bordas Durene Fruits, NP Elmon Else, CMA  History of Present Illness: Marcus [name] is a 61 year-old male who present for diabetes follow-up.   DIABETES TYPE 2 FOLLOW-UP: 05/26/2021: - Hemoglobin A1c today not at goal at 7.3%, goal < 7%. This is the same as previous 7.3% on 01/20/2021. - Continue Metformin as prescribed.  - Begin Dapagliflozin Propanediol as prescribed.   06/23/2021: Reports increased urination and bowel movements with Wilder Glade. However, overall doing well and feels comfortable continuing the medication.  2. GROIN CONCERN: Reports knot in left groin for a few months. Denies pain and additional red flag symptoms. About the same in size since first noticed.     Past Medical History:  Diagnosis Date   Diabetes mellitus without complication ([place])    Hypertension    No Known Allergies  Current Outpatient Medications on File Prior to Visit  Medication Sig Dispense Refill   albuterol (VENTOLIN HFA) 108 (90 Base) MCG/ACT inhaler Inhale 2 puffs into the lungs every 6 (six) hours as needed. 18 g 5    amLODipine (NORVASC) 5 MG tablet Take 1 tablet (5 mg total) by mouth daily. 90 tablet 0   atorvastatin (LIPITOR) 20 MG tablet Take 1 tablet (20 mg total) by mouth daily. 180 tablet 0   dapagliflozin propanediol (FARXIGA) 5 MG TABS tablet Take 1 tablet (5 mg total) by mouth daily before breakfast. 30 tablet 0   lisinopril (ZESTRIL) 10 MG tablet Take 1 tablet (10 mg total) by mouth daily. 90 tablet 0   metFORMIN (GLUCOPHAGE) 1000 MG tablet Take 1 tablet (1,000 mg total) by mouth 2 (two) times daily with a meal. 180 tablet 0   predniSONE (STERAPRED UNI-PAK 21 TAB) 5 MG (21) TBPK tablet Take as directed 21 tablet 0   traMADol (ULTRAM) 50 MG tablet Take 1 tablet (50 mg total) by mouth every 12 (twelve) hours as needed. 30 tablet 2   umeclidinium-vilanterol (ANORO ELLIPTA) 62.5-25 MCG/ACT AEPB Inhale 1 puff into the lungs daily. 1 each 5   No current facility-administered medications on file prior to visit.    Observations/Objective: Alert and oriented x 3. Not in acute distress. Physical examination not completed as this is a telemedicine visit.  Assessment and Plan: 1. Type 2 diabetes mellitus without complication, without long-term current use of insulin ([place]): - Continue Metformin as prescribed. No refills needed as of present.  - Continue Dapagliflozin Propanediol as prescribed.  - Discussed the importance of healthy eating habits, low-carbohydrate diet, low-sugar diet, regular aerobic exercise (at least 150 minutes a week as tolerated) and medication compliance to achieve or maintain control of diabetes. - Keep appointment scheduled 08/18/2021 to recheck hemoglobin A1c. Scheduled appointment sooner if  needed. - dapagliflozin propanediol (FARXIGA) 5 MG TABS tablet; Take 1 tablet (5 mg total) by mouth daily before breakfast.  Dispense: 60 tablet; Refill: 0  2. Groin discomfort, left: - Ultrasound pelvis complete for further evaluation.  - US Pelvis Complete; Future   Follow Up  Instructions: Keep appointment with primary provider on 08/18/2021 or sooner if needed.    Patient was given clear instructions to go to Emergency Department or return to medical center if symptoms don't improve, worsen, or new problems develop.The patient verbalized understanding.  I discussed the assessment and treatment plan with the patient. The patient was provided an opportunity to ask questions and all were answered. The patient agreed with the plan and demonstrated an understanding of the instructions.   The patient was advised to call back or seek an in-person evaluation if the symptoms worsen or if the condition fails to improve as anticipated.    I provided 8 minutes total of non-face-to-face time during this encounter.   Camillia Herter, NP  [facility] at [place], [place] 06/23/2021, 1:43 PM

## 2021-06-23 ENCOUNTER — Other Ambulatory Visit: Payer: Self-pay

## 2021-06-23 ENCOUNTER — Ambulatory Visit (INDEPENDENT_AMBULATORY_CARE_PROVIDER_SITE_OTHER): Payer: 59 | Admitting: Family

## 2021-06-23 DIAGNOSIS — R1032 Left lower quadrant pain: Secondary | ICD-10-CM

## 2021-06-23 DIAGNOSIS — E119 Type 2 diabetes mellitus without complications: Secondary | ICD-10-CM

## 2021-06-23 MED ORDER — DAPAGLIFLOZIN PROPANEDIOL 5 MG PO TABS: 5.0000 mg | ORAL_TABLET | Freq: Every day | ORAL | 0 refills | Status: AC

## 2021-06-23 NOTE — Progress Notes (Signed)
Pt presents for diabetes follow-up, states that he has been doing good with the [place] although he has increased urination and bowel movements

## 2021-07-08 ENCOUNTER — Ambulatory Visit (INDEPENDENT_AMBULATORY_CARE_PROVIDER_SITE_OTHER): Payer: 59 | Admitting: Physical Medicine and Rehabilitation

## 2021-07-08 ENCOUNTER — Encounter: Payer: Self-pay | Admitting: Physical Medicine and Rehabilitation

## 2021-07-08 ENCOUNTER — Other Ambulatory Visit: Payer: Self-pay

## 2021-07-08 ENCOUNTER — Ambulatory Visit: Payer: Self-pay

## 2021-07-08 DIAGNOSIS — R202 Paresthesia of skin: Secondary | ICD-10-CM

## 2021-07-08 DIAGNOSIS — M25552 Pain in left hip: Secondary | ICD-10-CM | POA: Diagnosis not present

## 2021-07-08 MED ORDER — TRIAMCINOLONE ACETONIDE 40 MG/ML IJ SUSP
60.0000 mg | INTRAMUSCULAR | Status: AC | PRN
  Administered 2021-07-08: 60 mg via INTRA_ARTICULAR

## 2021-07-08 MED ORDER — BUPIVACAINE HCL 0.25 % IJ SOLN
4.0000 mL | INTRAMUSCULAR | Status: AC | PRN
  Administered 2021-07-08: 4 mL via INTRA_ARTICULAR

## 2021-07-08 NOTE — Progress Notes (Signed)
Pt state left hand aches and pain. Pt state at work he uses new gloves that the job provided that he thinks cause most of the pain in his left hand. Pt state his hand gives out and goes numb. Pt state he takes pain meds to help ease his pain. Pt state he left handed. ? ?Numeric Pain Rating Scale and Functional Assessment ?Average Pain 8 ? ? ?In the last MONTH (on 0-10 scale) has pain interfered with the following? ? ?1. General activity like being  able to carry out your everyday physical activities such as walking, climbing stairs, carrying groceries, or moving a chair?  ?Rating(10) ? ? ?-BT, -Dye Allergies. ? ?

## 2021-07-08 NOTE — Progress Notes (Signed)
Pt state left hip pain. Pt state walking makes the pain worse. Pt state he takes pain meds to help ease his pain. ? ?Numeric Pain Rating Scale and Functional Assessment ?Average Pain 9 ? ? ?In the last MONTH (on 0-10 scale) has pain interfered with the following? ? ?1. General activity like being  able to carry out your everyday physical activities such as walking, climbing stairs, carrying groceries, or moving a chair?  ?Rating(10) ? ? ? -BT, -Dye Allergies. ? ?

## 2021-07-08 NOTE — Progress Notes (Signed)
? ?  Marcus [name] - 61 y.o. male MRN [id number]  Date of birth: 1960-08-31 ? ?Office Visit Note: ?Visit Date: 07/08/2021 ?PCP: Rema Fendt, NP ?Referred by: Rema Fendt, NP ? ?Subjective: ?Chief Complaint  ?Patient presents with  ? Left Hip - Pain  ? ?HPI:  Marcus [name] is a 61 y.o. male who comes in today at the request of Dr. Glee Arvin for planned Left anesthetic hip arthrogram with fluoroscopic guidance.  The patient has failed conservative care including home exercise, medications, time and activity modification.  This injection will be diagnostic and hopefully therapeutic.  Please see requesting physician notes for further details and justification. ? ?ROS Otherwise per HPI. ? ?Assessment & Plan: ?Visit Diagnoses:  ?  ICD-10-CM   ?1. Pain in left hip  M25.552 Large Joint Inj: L hip joint  ?  XR C-ARM NO REPORT  ?  ?  ?Plan: No additional findings.  ? ?Meds & Orders: No orders of the defined types were placed in this encounter. ?  ?Orders Placed This Encounter  ?Procedures  ? Large Joint Inj: L hip joint  ? XR C-ARM NO REPORT  ?  ?Follow-up: Return if symptoms worsen or fail to improve.  ? ?Procedures: ?Large Joint Inj: L hip joint on 07/08/2021 1:07 PM ?Indications: diagnostic evaluation and pain ?Details: 22 G 3.5 in needle, fluoroscopy-guided anterior approach ? ?Arthrogram: No ? ?Medications: 4 mL bupivacaine 0.25 %; 60 mg triamcinolone acetonide 40 MG/ML ?Outcome: tolerated well, no immediate complications ? ?There was excellent flow of contrast producing a partial arthrogram of the hip. The patient did have relief of symptoms during the anesthetic phase of the injection. ?Procedure, treatment alternatives, risks and benefits explained, specific risks discussed. Consent was given by the patient. Immediately prior to procedure a time out was called to verify the correct patient, procedure, equipment, support staff and site/side marked as required. Patient was prepped and draped in the usual sterile  fashion.  ? ?  ?   ? ?Clinical History: ?No specialty comments available.  ? ? ? ?Objective:  VS:  HT:    WT:   BMI:     BP:   HR: bpm  TEMP: ( )  RESP:  ?Physical Exam  ? ?Imaging: ?No results found. ?

## 2021-07-13 NOTE — Progress Notes (Signed)
? ?Marcus GraffVince [name] - 61 y.o. male MRN [id number]  Date of birth: 09/24/1960 ? ?Office Visit Note: ?Visit Date: 07/08/2021 ?PCP: Rema FendtStephens, Amy J, NP ?Referred by: Rema FendtStephens, Amy J, NP ? ?Subjective: ?Chief Complaint  ?Patient presents with  ? Left Hand - Pain, Numbness  ? ?HPI:  Marcus [name] is a 61 y.o. male who comes in today at the request of Dr. Glee ArvinMichael Xu for electrodiagnostic study of the Left upper extremities.  Patient is Left hand dominant.  He reports chronic worsening severe left hand aching type pain and occasional paresthesia.  He reports the pain is 8 out of 10 and constant.  Is worse with using his hands at work.  He feels like he was given a new glove that he has to use at work which is caused a lot of his symptoms.  He denies any frank radicular symptoms.  He is diabetic.  Last hemoglobin A1c 7.4 in February.  No prior electrodiagnostic studies. ? ?ROS Otherwise per HPI. ? ?Assessment & Plan: ?Visit Diagnoses:  ?  ICD-10-CM   ?1. Paresthesia of skin  R20.2 NCV with EMG (electromyography)  ?  ?  ?Plan: Impression: ?The above electrodiagnostic study is ABNORMAL and reveals evidence of a very mild right median nerve entrapment at the wrist affecting sensory components.  ? ?There is no significant electrodiagnostic evidence of any other focal nerve entrapment, brachial plexopathy or cervical radiculopathy. ? ?Recommendations: ?1.  Follow-up with referring physician. ?2.  Continue current management of symptoms. ? ?Meds & Orders: No orders of the defined types were placed in this encounter. ?  ?Orders Placed This Encounter  ?Procedures  ? NCV with EMG (electromyography)  ?  ?Follow-up: Return in about 2 weeks (around 07/22/2021) for  Glee ArvinMichael Xu, MD.  ? ?Procedures: ?No procedures performed  ?EMG & NCV Findings: ?Evaluation of the left median motor and the left ulnar motor nerves showed reduced amplitude (L4.9, L2.3 mV).  The left median (across palm) sensory nerve showed prolonged distal peak latency (3.7  ms).  All remaining nerves (as indicated in the following tables) were within normal limits.   ? ?All examined muscles (as indicated in the following table) showed no evidence of electrical instability.   ? ?Impression: ?The above electrodiagnostic study is ABNORMAL and reveals evidence of a very mild right median nerve entrapment at the wrist affecting sensory components.  ? ?There is no significant electrodiagnostic evidence of any other focal nerve entrapment, brachial plexopathy or cervical radiculopathy. ? ?Recommendations: ?1.  Follow-up with referring physician. ?2.  Continue current management of symptoms. ? ?___________________________ ?Naaman PlummerFred Conley Pawling FAAPMR ?Board Certified, Biomedical engineerAmerican Board of Physical Medicine and Rehabilitation ? ? ? ?Nerve Conduction Studies ?Anti Sensory Summary Table ? ? Stim Site NR Peak (ms) Norm Peak (ms) P-T Amp (?V) Norm P-T Amp Site1 Site2 Delta-P (ms) Dist (cm) Vel (m/s) Norm Vel (m/s)  ?Left Median Acr Palm Anti Sensory (2nd Digit)  33.3?C  ?Wrist    *3.7 <3.6 16.3 >10        ?Left Radial Anti Sensory (Base 1st Digit)  32.7?C  ?Wrist    2.2 <3.1 10.4  Wrist Base 1st Digit 2.2 0.0    ?Left Ulnar Anti Sensory (5th Digit)  33.4?C  ?Wrist    3.2 <3.7 19.2 >15.0 Wrist 5th Digit 3.2 14.0 44 >38  ? ?Motor Summary Table ? ? Stim Site NR Onset (ms) Norm Onset (ms) O-P Amp (mV) Norm O-P Amp Site1 Site2 Delta-0 (ms) Dist (cm) Vel (m/s) Norm Vel (  m/s)  ?Left Median Motor (Abd Poll Brev)  32.8?C  ?Wrist    3.8 <4.2 *4.9 >5 Elbow Wrist 4.2 22.5 54 >50  ?Elbow    8.0  4.8         ?Left Ulnar Motor (Abd Dig Min)  32.3?C  ?Wrist    3.4 <4.2 *2.3 >3 B Elbow Wrist 3.6 21.0 58 >53  ?B Elbow    7.0  2.7  A Elbow B Elbow 1.2 9.0 75 >53  ?A Elbow    8.2  3.7         ? ?EMG ? ? Side Muscle Nerve Root Ins Act Fibs Psw Amp Dur Poly Recrt Int Dennie Bible Comment  ?Left Abd Poll Brev Median C8-T1 Nml Nml Nml Nml Nml 0 Nml Nml   ?Left 1stDorInt Ulnar C8-T1 Nml Nml Nml Nml Nml 0 Nml Nml   ?Left PronatorTeres Median  C6-7 Nml Nml Nml Nml Nml 0 Nml Nml   ?Left Biceps Musculocut C5-6 Nml Nml Nml Nml Nml 0 Nml Nml   ?Left Deltoid Axillary C5-6 Nml Nml Nml Nml Nml 0 Nml Nml   ? ? ?Nerve Conduction Studies ?Anti Sensory Left/Right Comparison ? ? Stim Site L Lat (ms) R Lat (ms) L-R Lat (ms) L Amp (?V) R Amp (?V) L-R Amp (%) Site1 Site2 L Vel (m/s) R Vel (m/s) L-R Vel (m/s)  ?Median Acr Palm Anti Sensory (2nd Digit)  33.3?C  ?Wrist *3.7   16.3         ?Radial Anti Sensory (Base 1st Digit)  32.7?C  ?Wrist 2.2   10.4   Wrist Base 1st Digit     ?Ulnar Anti Sensory (5th Digit)  33.4?C  ?Wrist 3.2   19.2   Wrist 5th Digit 44    ? ?Motor Left/Right Comparison ? ? Stim Site L Lat (ms) R Lat (ms) L-R Lat (ms) L Amp (mV) R Amp (mV) L-R Amp (%) Site1 Site2 L Vel (m/s) R Vel (m/s) L-R Vel (m/s)  ?Median Motor (Abd Poll Brev)  32.8?C  ?Wrist 3.8   *4.9   Elbow Wrist 54    ?Elbow 8.0   4.8         ?Ulnar Motor (Abd Dig Min)  32.3?C  ?Wrist 3.4   *2.3   B Elbow Wrist 58    ?B Elbow 7.0   2.7   A Elbow B Elbow 75    ?A Elbow 8.2   3.7         ? ? ? ?Waveforms: ?    ? ?   ? ?  ? ?Clinical History: ?No specialty comments available.  ? ? ? ?Objective:  VS:  HT:    WT:   BMI:     BP:   HR: bpm  TEMP: ( )  RESP:  ?Physical Exam ?Musculoskeletal:     ?   General: No tenderness.  ?   Comments: Inspection reveals no atrophy of the bilateral APB or FDI or hand intrinsics. There is no swelling, color changes, allodynia or dystrophic changes. There is 5 out of 5 strength in the bilateral wrist extension, finger abduction and long finger flexion. There is intact sensation to light touch in all dermatomal and peripheral nerve distributions. There is a negative Hoffmann's test bilaterally.  ?Skin: ?   General: Skin is warm and dry.  ?   Findings: No erythema or rash.  ?Neurological:  ?   General: No focal deficit present.  ?   Mental Status:  He is alert and oriented to person, place, and time.  ?   Sensory: No sensory deficit.  ?   Motor: No weakness or  abnormal muscle tone.  ?   Coordination: Coordination normal.  ?   Gait: Gait normal.  ?Psychiatric:     ?   Mood and Affect: Mood normal.     ?   Behavior: Behavior normal.     ?   Thought Content: Thought content normal.  ?  ? ?Imaging: ?No results found. ?

## 2021-07-13 NOTE — Procedures (Signed)
EMG & NCV Findings: ?Evaluation of the left median motor and the left ulnar motor nerves showed reduced amplitude (L4.9, L2.3 mV).  The left median (across palm) sensory nerve showed prolonged distal peak latency (3.7 ms).  All remaining nerves (as indicated in the following tables) were within normal limits.   ? ?All examined muscles (as indicated in the following table) showed no evidence of electrical instability.   ? ?Impression: ?The above electrodiagnostic study is ABNORMAL and reveals evidence of a very mild right median nerve entrapment at the wrist affecting sensory components.  ? ?There is no significant electrodiagnostic evidence of any other focal nerve entrapment, brachial plexopathy or cervical radiculopathy. ? ?Recommendations: ?1.  Follow-up with referring physician. ?2.  Continue current management of symptoms. ? ?___________________________ ?Naaman Plummer FAAPMR ?Board Certified, Biomedical engineer of Physical Medicine and Rehabilitation ? ? ? ?Nerve Conduction Studies ?Anti Sensory Summary Table ? ? Stim Site NR Peak (ms) Norm Peak (ms) P-T Amp (?V) Norm P-T Amp Site1 Site2 Delta-P (ms) Dist (cm) Vel (m/s) Norm Vel (m/s)  ?Left Median Acr Palm Anti Sensory (2nd Digit)  33.3?C  ?Wrist    *3.7 <3.6 16.3 >10        ?Left Radial Anti Sensory (Base 1st Digit)  32.7?C  ?Wrist    2.2 <3.1 10.4  Wrist Base 1st Digit 2.2 0.0    ?Left Ulnar Anti Sensory (5th Digit)  33.4?C  ?Wrist    3.2 <3.7 19.2 >15.0 Wrist 5th Digit 3.2 14.0 44 >38  ? ?Motor Summary Table ? ? Stim Site NR Onset (ms) Norm Onset (ms) O-P Amp (mV) Norm O-P Amp Site1 Site2 Delta-0 (ms) Dist (cm) Vel (m/s) Norm Vel (m/s)  ?Left Median Motor (Abd Poll Brev)  32.8?C  ?Wrist    3.8 <4.2 *4.9 >5 Elbow Wrist 4.2 22.5 54 >50  ?Elbow    8.0  4.8         ?Left Ulnar Motor (Abd Dig Min)  32.3?C  ?Wrist    3.4 <4.2 *2.3 >3 B Elbow Wrist 3.6 21.0 58 >53  ?B Elbow    7.0  2.7  A Elbow B Elbow 1.2 9.0 75 >53  ?A Elbow    8.2  3.7         ? ?EMG ? ? Side Muscle  Nerve Root Ins Act Fibs Psw Amp Dur Poly Recrt Int Dennie Bible Comment  ?Left Abd Poll Brev Median C8-T1 Nml Nml Nml Nml Nml 0 Nml Nml   ?Left 1stDorInt Ulnar C8-T1 Nml Nml Nml Nml Nml 0 Nml Nml   ?Left PronatorTeres Median C6-7 Nml Nml Nml Nml Nml 0 Nml Nml   ?Left Biceps Musculocut C5-6 Nml Nml Nml Nml Nml 0 Nml Nml   ?Left Deltoid Axillary C5-6 Nml Nml Nml Nml Nml 0 Nml Nml   ? ? ?Nerve Conduction Studies ?Anti Sensory Left/Right Comparison ? ? Stim Site L Lat (ms) R Lat (ms) L-R Lat (ms) L Amp (?V) R Amp (?V) L-R Amp (%) Site1 Site2 L Vel (m/s) R Vel (m/s) L-R Vel (m/s)  ?Median Acr Palm Anti Sensory (2nd Digit)  33.3?C  ?Wrist *3.7   16.3         ?Radial Anti Sensory (Base 1st Digit)  32.7?C  ?Wrist 2.2   10.4   Wrist Base 1st Digit     ?Ulnar Anti Sensory (5th Digit)  33.4?C  ?Wrist 3.2   19.2   Wrist 5th Digit 44    ? ?Motor Left/Right Comparison ? ?  Stim Site L Lat (ms) R Lat (ms) L-R Lat (ms) L Amp (mV) R Amp (mV) L-R Amp (%) Site1 Site2 L Vel (m/s) R Vel (m/s) L-R Vel (m/s)  ?Median Motor (Abd Poll Brev)  32.8?C  ?Wrist 3.8   *4.9   Elbow Wrist 54    ?Elbow 8.0   4.8         ?Ulnar Motor (Abd Dig Min)  32.3?C  ?Wrist 3.4   *2.3   B Elbow Wrist 58    ?B Elbow 7.0   2.7   A Elbow B Elbow 75    ?A Elbow 8.2   3.7         ? ? ? ?Waveforms: ?    ? ?   ? ? ?

## 2021-08-06 NOTE — Pre-Procedure Instructions (Signed)
Surgical Instructions ? ? ? Your procedure is scheduled on Monday, April 24. ? Report to [facility] Main Entrance "A" at 0945 A.M., then check in with the Admitting office. ? Call this number if you have problems the morning of surgery: ? [phone] ? ? If you have any questions prior to your surgery date call [phone]: Open Monday-Friday 8am-4pm ? ? ? Remember: ? Do not eat after midnight the night before your surgery ? ?You may drink clear liquids until 0845AM the morning of your surgery.   ?Clear liquids allowed are: Water, Non-Citrus Juices (without pulp), Carbonated Beverages, Clear Tea, Black Coffee ONLY (NO MILK, CREAM OR POWDERED CREAMER of any kind), and Gatorade ?  ? Take these medicines the morning of surgery with A SIP OF WATER:  ? ?amLODipine (NORVASC) ?umeclidinium-vilanterol (ANORO ELLIPTA)  ?albuterol (VENTOLIN HFA) 108 (90 Base) MCG/ACT inhaler if needed ? ?Please bring all inhalers with you the day of surgery.  ? ?As of today, STOP taking any Aspirin (unless otherwise instructed by your surgeon) Aleve, Naproxen, Ibuprofen, Motrin, Advil, Goody's, BC's, all herbal medications, fish oil, and all vitamins. ? ?WHAT DO I DO ABOUT MY DIABETES MEDICATION? ? ? ?Do not take oral diabetes medicines (pills) the morning of surgery. DO NOT take metFORMIN (GLUCOPHAGE) or dapagliflozin propanediol (FARXIGA)  the morning of surgery ? ?DO NOT TAKE dapagliflozin propanediol (FARXIGA) the day before surgery.  ? ? ? ?HOW TO MANAGE YOUR DIABETES ?BEFORE AND AFTER SURGERY ? ?Why is it important to control my blood sugar before and after surgery? ?Improving blood sugar levels before and after surgery helps healing and can limit problems. ?A way of improving blood sugar control is eating a healthy diet by: ? Eating less sugar and carbohydrates ? Increasing activity/exercise ? Talking with your doctor about reaching your blood sugar goals ?High blood sugars (greater than 180 mg/dL) can raise your risk of infections  and slow your recovery, so you will need to focus on controlling your diabetes during the weeks before surgery. ?Make sure that the doctor who takes care of your diabetes knows about your planned surgery including the date and location. ? ?How do I manage my blood sugar before surgery? ?Check your blood sugar at least 4 times a day, starting 2 days before surgery, to make sure that the level is not too high or low. ? ?Check your blood sugar the morning of your surgery when you wake up and every 2 hours until you get to the Short Stay unit. ? ?If your blood sugar is less than 70 mg/dL, you will need to treat for low blood sugar: ?Do not take insulin. ?Treat a low blood sugar (less than 70 mg/dL) with ? cup of clear juice (cranberry or apple), 4 glucose tablets, OR glucose gel. ?Recheck blood sugar in 15 minutes after treatment (to make sure it is greater than 70 mg/dL). If your blood sugar is not greater than 70 mg/dL on recheck, call [phone] for further instructions. ?Report your blood sugar to the short stay nurse when you get to Short Stay. ? ?If you are admitted to the hospital after surgery: ?Your blood sugar will be checked by the staff and you will probably be given insulin after surgery (instead of oral diabetes medicines) to make sure you have good blood sugar levels. ?The goal for blood sugar control after surgery is 80-180 mg/dL. ? ?         ?Do not wear jewelry or makeup ?Do not wear lotions,  powders, perfumes/colognes, or deodorant. ?Do not shave 48 hours prior to surgery.  Men may shave face and neck. ?Do not bring valuables to the hospital. ?Do not wear nail polish, gel polish, artificial nails, or any other type of covering on natural nails (fingers and toes) ?If you have artificial nails or gel coating that need to be removed by a nail salon, please have this removed prior to surgery. Artificial nails or gel coating may interfere with anesthesia's ability to adequately monitor your vital  signs. ? ?[facility] is not responsible for any belongings or valuables. .  ? ?Do NOT Smoke (Tobacco/Vaping)  24 hours prior to your procedure ? ?If you use a CPAP at night, you may bring your mask for your overnight stay. ?  ?Contacts, glasses, hearing aids, dentures or partials may not be worn into surgery, please bring cases for these belongings ?  ?For patients admitted to the hospital, discharge time will be determined by your treatment team. ?  ?Patients discharged the day of surgery will not be allowed to drive home, and someone needs to stay with them for 24 hours. ? ? ?SURGICAL WAITING ROOM VISITATION ?Patients having surgery or a procedure in a hospital may have two support people. ?Children under the age of 35 must have an adult with them who is not the patient. ?They may stay in the waiting area during the procedure and may switch out with other visitors. If the patient needs to stay at the hospital during part of their recovery, the visitor guidelines for inpatient rooms apply. ? ?Please refer to the [facility] website for the visitor guidelines for Inpatients (after your surgery is over and you are in a regular room).  ? ? ? ? ? ?Special instructions:   ? ?Oral Hygiene is also important to reduce your risk of infection.  Remember - BRUSH YOUR TEETH THE MORNING OF SURGERY WITH YOUR REGULAR TOOTHPASTE ? ? ?[facility]- Preparing For Surgery ? ?Before surgery, you can play an important role. Because skin is not sterile, your skin needs to be as free of germs as possible. You can reduce the number of germs on your skin by washing with CHG (chlorahexidine gluconate) Soap before surgery.  CHG is an antiseptic cleaner which kills germs and bonds with the skin to continue killing germs even after washing.   ? ? ?Please do not use if you have an allergy to CHG or antibacterial soaps. If your skin becomes reddened/irritated stop using the CHG.  ?Do not shave (including legs and underarms) for at least 48 hours  prior to first CHG shower. It is OK to shave your face. ? ?Please follow these instructions carefully. ?  ? ? Shower the NIGHT BEFORE SURGERY and the MORNING OF SURGERY with CHG Soap.  ? If you chose to wash your hair, wash your hair first as usual with your normal shampoo. After you shampoo, rinse your hair and body thoroughly to remove the shampoo.  Then [employer] and genitals (private parts) with your normal soap and rinse thoroughly to remove soap. ? ?After that Use CHG Soap as you would any other liquid soap. You can apply CHG directly to the skin and wash gently with a scrungie or a clean washcloth.  ? ?Apply the CHG Soap to your body ONLY FROM THE NECK DOWN.  Do not use on open wounds or open sores. Avoid contact with your eyes, ears, mouth and genitals (private parts). Wash Face and genitals (private parts)  with your normal soap.  ? ?Wash thoroughly, paying special attention to the area where your surgery will be performed. ? ?Thoroughly rinse your body with warm water from the neck down. ? ?DO NOT shower/wash with your normal soap after using and rinsing off the CHG Soap. ? ?Pat yourself dry with a CLEAN TOWEL. ? ?Wear CLEAN PAJAMAS to bed the night before surgery ? ?Place CLEAN SHEETS on your bed the night before your surgery ? ?DO NOT SLEEP WITH PETS. ? ? ?Day of Surgery: ? ?Take a shower with CHG soap. ?Wear Clean/Comfortable clothing the morning of surgery ?Do not apply any deodorants/lotions.   ?Remember to brush your teeth WITH YOUR REGULAR TOOTHPASTE. ? ? ? ?If you received a COVID test during your pre-op visit  it is requested that you wear a mask when out in public, stay away from anyone that may not be feeling well and notify your surgeon if you develop symptoms. If you have been in contact with anyone that has tested positive in the last 10 days please notify you surgeon. ? ?  ?Please read over the following fact sheets that you were given.  ? ?

## 2021-08-07 ENCOUNTER — Encounter (HOSPITAL_COMMUNITY): Payer: Self-pay

## 2021-08-07 ENCOUNTER — Encounter (HOSPITAL_COMMUNITY)
Admission: RE | Admit: 2021-08-07 | Discharge: 2021-08-07 | Disposition: A | Discharge status: Home / Self Care | Payer: 59 | Source: Ambulatory Visit | Attending: Orthopaedic Surgery | Admitting: Orthopaedic Surgery

## 2021-08-07 ENCOUNTER — Other Ambulatory Visit: Payer: Self-pay

## 2021-08-07 VITALS — BP 137/71 | HR 64 | Temp 98.0°F | Resp 17 | Ht 63.0 in | Wt 125.0 lb

## 2021-08-07 DIAGNOSIS — E119 Type 2 diabetes mellitus without complications: Secondary | ICD-10-CM | POA: Insufficient documentation

## 2021-08-07 DIAGNOSIS — Z01818 Encounter for other preprocedural examination: Secondary | ICD-10-CM | POA: Insufficient documentation

## 2021-08-07 DIAGNOSIS — M1712 Unilateral primary osteoarthritis, left knee: Secondary | ICD-10-CM | POA: Insufficient documentation

## 2021-08-07 DIAGNOSIS — F17200 Nicotine dependence, unspecified, uncomplicated: Secondary | ICD-10-CM | POA: Diagnosis not present

## 2021-08-07 DIAGNOSIS — I10 Essential (primary) hypertension: Secondary | ICD-10-CM | POA: Insufficient documentation

## 2021-08-07 LAB — SURGICAL PCR SCREEN
MRSA, PCR: NEGATIVE
Staphylococcus aureus: NEGATIVE

## 2021-08-07 LAB — COMPREHENSIVE METABOLIC PANEL
ALT: 12 U/L (ref 0–44)
AST: 12 U/L — ABNORMAL LOW (ref 15–41)
Albumin: 3.5 g/dL (ref 3.5–5.0)
Alkaline Phosphatase: 56 U/L (ref 38–126)
Anion gap: 8 (ref 5–15)
BUN: 6 mg/dL — ABNORMAL LOW (ref 8–23)
CO2: 23 mmol/L (ref 22–32)
Calcium: 8.7 mg/dL — ABNORMAL LOW (ref 8.9–10.3)
Chloride: 110 mmol/L (ref 98–111)
Creatinine, Ser: 0.74 mg/dL (ref 0.61–1.24)
GFR, Estimated: >60 mL/min (ref >60)
Glucose, Bld: 171 mg/dL — ABNORMAL HIGH (ref 70–99)
Potassium: 3.8 mmol/L (ref 3.5–5.1)
Sodium: 141 mmol/L (ref 135–145)
Total Bilirubin: 0.5 mg/dL (ref 0.3–1.2)
Total Protein: 6.2 g/dL — ABNORMAL LOW (ref 6.5–8.1)

## 2021-08-07 LAB — GLUCOSE, CAPILLARY: Glucose-Capillary: 218 mg/dL — ABNORMAL HIGH (ref 70–99)

## 2021-08-07 LAB — CBC
HCT: 43.5 % (ref 39.0–52.0)
Hemoglobin: 14.7 g/dL (ref 13.0–17.0)
MCH: 30.6 pg (ref 26.0–34.0)
MCHC: 33.8 g/dL (ref 30.0–36.0)
MCV: 90.4 fL (ref 80.0–100.0)
Platelets: 249 10*3/uL (ref 150–400)
RBC: 4.81 MIL/uL (ref 4.22–5.81)
RDW: 14.5 % (ref 11.5–15.5)
WBC: 8.3 10*3/uL (ref 4.0–10.5)
nRBC: 0 % (ref 0.0–0.2)

## 2021-08-07 NOTE — Progress Notes (Addendum)
PCP - Warren Lacy Minette Brine NP ?Cardiologist - Denies ? ?Chest x-ray - Not indicated ?EKG - 08/07/21 ?Stress Test - Denies ?ECHO - Denies ?Cardiac Cath - Denies ? ?Sleep Study - denies ? ?DM - Type II ?CBG at PAT 218 only had coffee with real sugar and candy  ?Checks Blood Sugar Does not really check  ? ?ERAS Protcol - Yes  ?PRE-SURGERY  G2- given ? ?COVID TEST- Not indicated ? ? ?Anesthesia review: Yes abnormal EKG ? ?Patient denies shortness of breath, fever, cough and chest pain at PAT appointment ? ? ?All instructions explained to the patient, with a verbal understanding of the material. Patient agrees to go over the instructions while at home for a better understanding.  The opportunity to ask questions was provided. ? ? ?

## 2021-08-08 NOTE — Progress Notes (Signed)
Anesthesia Chart Review: ? Case: [id number] Date/Time: 08/18/21 1130  ? Procedure: LEFT TOTAL KNEE ARTHROPLASTY (Left: Knee)  ? Anesthesia type: Spinal  ? Pre-op diagnosis: LEFT KNEE DEGENERATIVE JOINT DISEASE  ? Location: [facility] OR ROOM 06 / [facility]  ? Surgeons: Leandrew Koyanagi, MD  ? ?  ? ? ?DISCUSSION: Patient is a 61 year old male scheduled for the above procedure. ? ?History includes smoking, HTN, DM2. + Marijuana use. He works as a Training and development officer at [employer].  ? ?A1c 2 months ago was stable at 7.3%. PAT EKG and labs reviewed. He denies shortness of breath, fever, cough, chest pain at PAT RN visit.  Anesthesia team to evaluate on the day of surgery. ? ? ?VS: BP 137/71   Pulse 64   Temp 36.7 ?C (Oral)   Resp 17   Ht 5\' 3"  (1.6 m)   Wt 56.7 kg   SpO2 100%   BMI 22.14 kg/m?  ? ? ?PROVIDERS: ?Camillia Herter, NP is PCP. Established care 09/27/20. Referred him back to Dr. Erlinda Hong at 05/26/21 visit. ? ?Lenice Llamas, MD is pulmonologist. Established 02/18/21 for possible COPD on CXR. Also with coughing post-COVID. Started on Anora and as needed albuterol. Had follow-up 03/17/21 and breathing was better. Did not scheduled PFTs. Still smoking 1/2 PPD and using marijuana. Continue meds. Follow-up with ortho for leg pain encouraged. Follow-up around 3 months planned.  ? ?LABS: Labs reviewed: Acceptable for surgery. A1c 7.3% 06/10/21.  ?(all labs ordered are listed, but only abnormal results are displayed) ? ?Labs Reviewed  ?GLUCOSE, CAPILLARY - Abnormal; Notable for the following components:  ?    Result Value  ? Glucose-Capillary 218 (*)   ? All other components within normal limits  ?COMPREHENSIVE METABOLIC PANEL - Abnormal; Notable for the following components:  ? Glucose, Bld 171 (*)   ? BUN 6 (*)   ? Calcium 8.7 (*)   ? Total Protein 6.2 (*)   ? AST 12 (*)   ? All other components within normal limits  ?SURGICAL PCR SCREEN  ?CBC  ? ? ? ?IMAGES: ?CXR 01/20/21: ?FINDINGS: ?The heart size and mediastinal contours are within normal  limits. ?Pulmonary hyperinflation is seen, suspicious for COPD. Both lungs ?are clear. The visualized skeletal structures are unremarkable. ?IMPRESSION: ?Probable COPD. No active cardiopulmonary disease. ? ? ?EKG: 08/07/21:  ?Normal sinus rhythm ?Possible Anteroseptal infarct , age undetermined versus lead placement ?Abnormal ECG ?No previous ECGs available ?Confirmed by Nelva Bush [phone]) on 08/08/2021 5:14:17 PM ? ? ?CV: N/A ? ? ?Past Medical History:  ?Diagnosis Date  ? Diabetes mellitus without complication ([place])   ? Hypertension   ? ? ?Past Surgical History:  ?Procedure Laterality Date  ? ankle surgery Left 1980  ? ? ?MEDICATIONS: ? albuterol (VENTOLIN HFA) 108 (90 Base) MCG/ACT inhaler  ? amLODipine (NORVASC) 5 MG tablet  ? atorvastatin (LIPITOR) 20 MG tablet  ? dapagliflozin propanediol (FARXIGA) 5 MG TABS tablet  ? lisinopril (ZESTRIL) 10 MG tablet  ? metFORMIN (GLUCOPHAGE) 1000 MG tablet  ? predniSONE (STERAPRED UNI-PAK 21 TAB) 5 MG (21) TBPK tablet  ? traMADol (ULTRAM) 50 MG tablet  ? umeclidinium-vilanterol (ANORO ELLIPTA) 62.5-25 MCG/ACT AEPB  ? ?No current facility-administered medications for this encounter.  ? ?Not currently taking prednisone. ? ? ?Myra Gianotti, PA-C ?Surgical Short Stay/Anesthesiology ?[facility] Phone [phone] ?[facility] Phone [phone] ?08/08/2021 5:33 PM ? ? ? ? ? ? ? ?

## 2021-08-11 ENCOUNTER — Other Ambulatory Visit: Payer: Self-pay | Admitting: Physician Assistant

## 2021-08-11 MED ORDER — METHOCARBAMOL 500 MG PO TABS: 500.0000 mg | ORAL_TABLET | Freq: Two times a day (BID) | ORAL | 2 refills | Status: DC | PRN

## 2021-08-11 MED ORDER — ONDANSETRON HCL 4 MG PO TABS: 4.0000 mg | ORAL_TABLET | Freq: Three times a day (TID) | ORAL | 0 refills | Status: DC | PRN

## 2021-08-11 MED ORDER — SULFAMETHOXAZOLE-TRIMETHOPRIM 800-160 MG PO TABS: 1.0000 | ORAL_TABLET | Freq: Two times a day (BID) | ORAL | 0 refills | Status: DC

## 2021-08-11 MED ORDER — DOCUSATE SODIUM 100 MG PO CAPS: 100.0000 mg | ORAL_CAPSULE | Freq: Every day | ORAL | 2 refills | Status: AC | PRN

## 2021-08-11 MED ORDER — OXYCODONE-ACETAMINOPHEN 5-325 MG PO TABS: 1.0000 | ORAL_TABLET | ORAL | 0 refills | Status: DC | PRN

## 2021-08-11 MED ORDER — ASPIRIN EC 81 MG PO TBEC: 81.0000 mg | DELAYED_RELEASE_TABLET | Freq: Two times a day (BID) | ORAL | 0 refills | Status: DC

## 2021-08-15 MED ORDER — TRANEXAMIC ACID 1000 MG/10ML IV SOLN
2000.0000 mg | INTRAVENOUS | Status: AC
  Administered 2021-08-18: 2000 mg via TOPICAL
  Filled 2021-08-15: qty 20

## 2021-08-15 NOTE — Progress Notes (Signed)
patient voiced understanding of new arrival time of 0830, clear liquids and ERAS until 0730. ?

## 2021-08-18 ENCOUNTER — Encounter (HOSPITAL_COMMUNITY)
Admission: RE | Disposition: A | Discharge status: Home Health | Payer: Self-pay | Source: Home / Self Care | Attending: Orthopaedic Surgery

## 2021-08-18 ENCOUNTER — Ambulatory Visit (HOSPITAL_COMMUNITY): Payer: 59 | Admitting: Vascular Surgery

## 2021-08-18 ENCOUNTER — Encounter (HOSPITAL_COMMUNITY): Payer: Self-pay | Admitting: Orthopaedic Surgery

## 2021-08-18 ENCOUNTER — Observation Stay (HOSPITAL_COMMUNITY)
Admission: RE | Admit: 2021-08-18 | Discharge: 2021-08-19 | Disposition: A | Discharge status: Home Health | Payer: 59 | Attending: Orthopaedic Surgery | Admitting: Orthopaedic Surgery

## 2021-08-18 ENCOUNTER — Other Ambulatory Visit: Payer: Self-pay

## 2021-08-18 ENCOUNTER — Ambulatory Visit (HOSPITAL_BASED_OUTPATIENT_CLINIC_OR_DEPARTMENT_OTHER): Payer: 59 | Admitting: Anesthesiology

## 2021-08-18 ENCOUNTER — Observation Stay (HOSPITAL_COMMUNITY): Payer: 59

## 2021-08-18 ENCOUNTER — Ambulatory Visit: Payer: 59 | Admitting: Family

## 2021-08-18 DIAGNOSIS — I10 Essential (primary) hypertension: Secondary | ICD-10-CM | POA: Insufficient documentation

## 2021-08-18 DIAGNOSIS — E119 Type 2 diabetes mellitus without complications: Secondary | ICD-10-CM | POA: Diagnosis not present

## 2021-08-18 DIAGNOSIS — Z7984 Long term (current) use of oral hypoglycemic drugs: Secondary | ICD-10-CM | POA: Insufficient documentation

## 2021-08-18 DIAGNOSIS — F17210 Nicotine dependence, cigarettes, uncomplicated: Secondary | ICD-10-CM | POA: Diagnosis not present

## 2021-08-18 DIAGNOSIS — Z7982 Long term (current) use of aspirin: Secondary | ICD-10-CM | POA: Insufficient documentation

## 2021-08-18 DIAGNOSIS — M1712 Unilateral primary osteoarthritis, left knee: Secondary | ICD-10-CM

## 2021-08-18 DIAGNOSIS — Z96652 Presence of left artificial knee joint: Secondary | ICD-10-CM

## 2021-08-18 DIAGNOSIS — Z79899 Other long term (current) drug therapy: Secondary | ICD-10-CM | POA: Diagnosis not present

## 2021-08-18 HISTORY — PX: TOTAL KNEE ARTHROPLASTY: SHX125

## 2021-08-18 LAB — GLUCOSE, CAPILLARY
Glucose-Capillary: 150 mg/dL — ABNORMAL HIGH (ref 70–99)
Glucose-Capillary: 110 mg/dL — ABNORMAL HIGH (ref 70–99)

## 2021-08-18 SURGERY — ARTHROPLASTY, KNEE, TOTAL
Anesthesia: Monitor Anesthesia Care | Site: Knee | Laterality: Left

## 2021-08-18 MED ORDER — AMISULPRIDE (ANTIEMETIC) 5 MG/2ML IV SOLN: 10.0000 mg | Freq: Once | INTRAVENOUS | Status: DC | PRN

## 2021-08-18 MED ORDER — ONDANSETRON HCL 4 MG/2ML IJ SOLN
INTRAMUSCULAR | Status: AC
  Filled 2021-08-18: qty 2

## 2021-08-18 MED ORDER — METHOCARBAMOL 1000 MG/10ML IJ SOLN
500.0000 mg | Freq: Four times a day (QID) | INTRAVENOUS | Status: DC | PRN
  Filled 2021-08-18: qty 5

## 2021-08-18 MED ORDER — CEFAZOLIN SODIUM-DEXTROSE 2-4 GM/100ML-% IV SOLN
2.0000 g | INTRAVENOUS | Status: AC
  Administered 2021-08-18: 2 g via INTRAVENOUS
  Filled 2021-08-18: qty 100

## 2021-08-18 MED ORDER — ASPIRIN 81 MG PO CHEW
81.0000 mg | CHEWABLE_TABLET | Freq: Two times a day (BID) | ORAL | Status: DC
  Administered 2021-08-18 – 2021-08-19 (×2): 81 mg via ORAL
  Filled 2021-08-18 (×2): qty 1

## 2021-08-18 MED ORDER — BUPIVACAINE-MELOXICAM ER 400-12 MG/14ML IJ SOLN
INTRAMUSCULAR | Status: AC
  Filled 2021-08-18: qty 1

## 2021-08-18 MED ORDER — MIDAZOLAM HCL 2 MG/2ML IJ SOLN
INTRAMUSCULAR | Status: AC
  Filled 2021-08-18: qty 2

## 2021-08-18 MED ORDER — BUPIVACAINE HCL (PF) 0.5 % IJ SOLN
INTRAMUSCULAR | Status: DC | PRN
  Administered 2021-08-18: 25 mL via PERINEURAL

## 2021-08-18 MED ORDER — INSULIN ASPART 100 UNIT/ML IJ SOLN
0.0000 [IU] | INTRAMUSCULAR | Status: DC | PRN
  Administered 2021-08-18: 2 [IU] via SUBCUTANEOUS

## 2021-08-18 MED ORDER — METOCLOPRAMIDE HCL 5 MG PO TABS: 5.0000 mg | ORAL_TABLET | Freq: Three times a day (TID) | ORAL | Status: DC | PRN

## 2021-08-18 MED ORDER — DEXAMETHASONE SODIUM PHOSPHATE 10 MG/ML IJ SOLN
INTRAMUSCULAR | Status: AC
  Filled 2021-08-18: qty 1

## 2021-08-18 MED ORDER — TRANEXAMIC ACID-NACL 1000-0.7 MG/100ML-% IV SOLN
1000.0000 mg | INTRAVENOUS | Status: AC
  Administered 2021-08-18: 1000 mg via INTRAVENOUS
  Filled 2021-08-18: qty 100

## 2021-08-18 MED ORDER — FENTANYL CITRATE (PF) 250 MCG/5ML IJ SOLN
INTRAMUSCULAR | Status: DC | PRN
  Administered 2021-08-18 (×3): 50 ug via INTRAVENOUS

## 2021-08-18 MED ORDER — FENTANYL CITRATE (PF) 100 MCG/2ML IJ SOLN
INTRAMUSCULAR | Status: AC
  Administered 2021-08-18: 50 ug
  Filled 2021-08-18: qty 2

## 2021-08-18 MED ORDER — BUPIVACAINE-MELOXICAM ER 400-12 MG/14ML IJ SOLN
INTRAMUSCULAR | Status: DC | PRN
  Administered 2021-08-18: 400 mg

## 2021-08-18 MED ORDER — OXYCODONE HCL 5 MG PO TABS: 5.0000 mg | ORAL_TABLET | ORAL | Status: DC | PRN

## 2021-08-18 MED ORDER — OXYCODONE HCL 5 MG/5ML PO SOLN
ORAL | Status: AC
  Filled 2021-08-18: qty 5

## 2021-08-18 MED ORDER — FENTANYL CITRATE (PF) 100 MCG/2ML IJ SOLN
50.0000 ug | Freq: Once | INTRAMUSCULAR | Status: DC
Start: 2021-08-18 — End: 2021-08-18

## 2021-08-18 MED ORDER — ONDANSETRON HCL 4 MG/2ML IJ SOLN
4.0000 mg | Freq: Four times a day (QID) | INTRAMUSCULAR | Status: DC | PRN
  Administered 2021-08-19: 4 mg via INTRAVENOUS
  Filled 2021-08-18: qty 2

## 2021-08-18 MED ORDER — ONDANSETRON HCL 4 MG PO TABS: 4.0000 mg | ORAL_TABLET | Freq: Four times a day (QID) | ORAL | Status: DC | PRN

## 2021-08-18 MED ORDER — EPHEDRINE SULFATE-NACL 50-0.9 MG/10ML-% IV SOSY
PREFILLED_SYRINGE | INTRAVENOUS | Status: DC | PRN
  Administered 2021-08-18: 5 mg via INTRAVENOUS

## 2021-08-18 MED ORDER — SODIUM CHLORIDE 0.9 % IR SOLN
Status: DC | PRN
  Administered 2021-08-18: 1000 mL

## 2021-08-18 MED ORDER — DOCUSATE SODIUM 100 MG PO CAPS
100.0000 mg | ORAL_CAPSULE | Freq: Two times a day (BID) | ORAL | Status: DC
  Administered 2021-08-18 – 2021-08-19 (×2): 100 mg via ORAL
  Filled 2021-08-18 (×2): qty 1

## 2021-08-18 MED ORDER — HYDROMORPHONE HCL 1 MG/ML IJ SOLN
0.2500 mg | INTRAMUSCULAR | Status: DC | PRN
  Administered 2021-08-18 (×2): 0.5 mg via INTRAVENOUS

## 2021-08-18 MED ORDER — KETOROLAC TROMETHAMINE 15 MG/ML IJ SOLN
15.0000 mg | Freq: Four times a day (QID) | INTRAMUSCULAR | Status: DC
  Administered 2021-08-19 (×2): 15 mg via INTRAVENOUS
  Filled 2021-08-18 (×3): qty 1

## 2021-08-18 MED ORDER — INSULIN ASPART 100 UNIT/ML IJ SOLN
INTRAMUSCULAR | Status: AC
  Filled 2021-08-18: qty 1

## 2021-08-18 MED ORDER — OXYCODONE HCL ER 10 MG PO T12A
10.0000 mg | EXTENDED_RELEASE_TABLET | Freq: Two times a day (BID) | ORAL | Status: DC
  Administered 2021-08-18 – 2021-08-19 (×2): 10 mg via ORAL
  Filled 2021-08-18 (×2): qty 1

## 2021-08-18 MED ORDER — FERROUS SULFATE 325 (65 FE) MG PO TABS
325.0000 mg | ORAL_TABLET | Freq: Three times a day (TID) | ORAL | Status: DC
  Administered 2021-08-18 – 2021-08-19 (×2): 325 mg via ORAL
  Filled 2021-08-18 (×2): qty 1

## 2021-08-18 MED ORDER — OXYCODONE HCL 5 MG PO TABS
10.0000 mg | ORAL_TABLET | ORAL | Status: DC | PRN
  Administered 2021-08-19: 15 mg via ORAL
  Filled 2021-08-18 (×2): qty 3

## 2021-08-18 MED ORDER — PHENOL 1.4 % MT LIQD: 1.0000 | OROMUCOSAL | Status: DC | PRN

## 2021-08-18 MED ORDER — LACTATED RINGERS IV SOLN: INTRAVENOUS | Status: DC

## 2021-08-18 MED ORDER — SODIUM CHLORIDE 0.9 % IV SOLN: INTRAVENOUS | Status: DC

## 2021-08-18 MED ORDER — VANCOMYCIN HCL 1000 MG IV SOLR
INTRAVENOUS | Status: AC
  Filled 2021-08-18: qty 20

## 2021-08-18 MED ORDER — LISINOPRIL 10 MG PO TABS
10.0000 mg | ORAL_TABLET | Freq: Every day | ORAL | Status: DC
  Administered 2021-08-18 – 2021-08-19 (×2): 10 mg via ORAL
  Filled 2021-08-18 (×2): qty 1

## 2021-08-18 MED ORDER — ONDANSETRON HCL 4 MG/2ML IJ SOLN: 4.0000 mg | Freq: Once | INTRAMUSCULAR | Status: DC | PRN

## 2021-08-18 MED ORDER — ACETAMINOPHEN 500 MG PO TABS
1000.0000 mg | ORAL_TABLET | Freq: Once | ORAL | Status: AC
  Administered 2021-08-18: 1000 mg via ORAL
  Filled 2021-08-18: qty 2

## 2021-08-18 MED ORDER — CEFAZOLIN SODIUM-DEXTROSE 2-4 GM/100ML-% IV SOLN
2.0000 g | Freq: Four times a day (QID) | INTRAVENOUS | Status: AC
  Administered 2021-08-19: 2 g via INTRAVENOUS
  Filled 2021-08-18: qty 100

## 2021-08-18 MED ORDER — METOCLOPRAMIDE HCL 5 MG/ML IJ SOLN: 5.0000 mg | Freq: Three times a day (TID) | INTRAMUSCULAR | Status: DC | PRN

## 2021-08-18 MED ORDER — POVIDONE-IODINE 10 % EX SWAB
2.0000 "application " | Freq: Once | CUTANEOUS | Status: AC
  Administered 2021-08-18: 2 via TOPICAL

## 2021-08-18 MED ORDER — ORAL CARE MOUTH RINSE: 15.0000 mL | Freq: Once | OROMUCOSAL | Status: AC

## 2021-08-18 MED ORDER — CHLORHEXIDINE GLUCONATE 0.12 % MT SOLN
15.0000 mL | Freq: Once | OROMUCOSAL | Status: AC
  Administered 2021-08-18: 15 mL via OROMUCOSAL
  Filled 2021-08-18: qty 15

## 2021-08-18 MED ORDER — METHOCARBAMOL 500 MG PO TABS: 500.0000 mg | ORAL_TABLET | Freq: Four times a day (QID) | ORAL | Status: DC | PRN

## 2021-08-18 MED ORDER — DEXAMETHASONE SODIUM PHOSPHATE 10 MG/ML IJ SOLN
10.0000 mg | Freq: Once | INTRAMUSCULAR | Status: AC
  Administered 2021-08-19: 10 mg via INTRAVENOUS
  Filled 2021-08-18: qty 1

## 2021-08-18 MED ORDER — DEXAMETHASONE SODIUM PHOSPHATE 10 MG/ML IJ SOLN
INTRAMUSCULAR | Status: DC | PRN
Start: 2021-08-18 — End: 2021-08-18
  Administered 2021-08-18: 5 mg via INTRAVENOUS

## 2021-08-18 MED ORDER — OXYCODONE HCL 5 MG/5ML PO SOLN
5.0000 mg | Freq: Once | ORAL | Status: AC | PRN
  Administered 2021-08-18: 5 mg via ORAL

## 2021-08-18 MED ORDER — TRANEXAMIC ACID-NACL 1000-0.7 MG/100ML-% IV SOLN
1000.0000 mg | Freq: Once | INTRAVENOUS | Status: AC
  Administered 2021-08-18: 1000 mg via INTRAVENOUS
  Filled 2021-08-18: qty 100

## 2021-08-18 MED ORDER — FENTANYL CITRATE (PF) 250 MCG/5ML IJ SOLN
INTRAMUSCULAR | Status: AC
  Filled 2021-08-18: qty 5

## 2021-08-18 MED ORDER — PROPOFOL 10 MG/ML IV BOLUS
INTRAVENOUS | Status: DC | PRN
  Administered 2021-08-18: 20 mg via INTRAVENOUS
  Administered 2021-08-18: 200 mg via INTRAVENOUS

## 2021-08-18 MED ORDER — HYDROMORPHONE HCL 1 MG/ML IJ SOLN
INTRAMUSCULAR | Status: AC
  Filled 2021-08-18: qty 1

## 2021-08-18 MED ORDER — DAPAGLIFLOZIN PROPANEDIOL 5 MG PO TABS
5.0000 mg | ORAL_TABLET | Freq: Every day | ORAL | Status: DC
  Administered 2021-08-19: 5 mg via ORAL
  Filled 2021-08-18: qty 1

## 2021-08-18 MED ORDER — PHENYLEPHRINE HCL-NACL 20-0.9 MG/250ML-% IV SOLN
INTRAVENOUS | Status: DC | PRN
  Administered 2021-08-18: 50 ug/min via INTRAVENOUS

## 2021-08-18 MED ORDER — OXYCODONE HCL 5 MG PO TABS: 5.0000 mg | ORAL_TABLET | Freq: Once | ORAL | Status: AC | PRN

## 2021-08-18 MED ORDER — ONDANSETRON HCL 4 MG/2ML IJ SOLN
INTRAMUSCULAR | Status: DC | PRN
  Administered 2021-08-18: 4 mg via INTRAVENOUS

## 2021-08-18 MED ORDER — VANCOMYCIN HCL 1000 MG IV SOLR
INTRAVENOUS | Status: DC | PRN
  Administered 2021-08-18: 1000 mg

## 2021-08-18 MED ORDER — HYDROMORPHONE HCL 1 MG/ML IJ SOLN
0.5000 mg | INTRAMUSCULAR | Status: DC | PRN
  Administered 2021-08-18: 1 mg via INTRAVENOUS
  Filled 2021-08-18: qty 1

## 2021-08-18 MED ORDER — AMLODIPINE BESYLATE 5 MG PO TABS
5.0000 mg | ORAL_TABLET | Freq: Every day | ORAL | Status: DC
  Administered 2021-08-18 – 2021-08-19 (×2): 5 mg via ORAL
  Filled 2021-08-18 (×2): qty 1

## 2021-08-18 MED ORDER — CEFAZOLIN SODIUM-DEXTROSE 2-4 GM/100ML-% IV SOLN
2.0000 g | Freq: Four times a day (QID) | INTRAVENOUS | Status: DC
  Administered 2021-08-18: 2 g via INTRAVENOUS
  Filled 2021-08-18: qty 100

## 2021-08-18 MED ORDER — 0.9 % SODIUM CHLORIDE (POUR BTL) OPTIME
TOPICAL | Status: DC | PRN
  Administered 2021-08-18: 1000 mL

## 2021-08-18 MED ORDER — ACETAMINOPHEN 325 MG PO TABS: 325.0000 mg | ORAL_TABLET | Freq: Four times a day (QID) | ORAL | Status: DC | PRN

## 2021-08-18 MED ORDER — MENTHOL 3 MG MT LOZG: 1.0000 | LOZENGE | OROMUCOSAL | Status: DC | PRN

## 2021-08-18 MED ORDER — ACETAMINOPHEN 500 MG PO TABS
1000.0000 mg | ORAL_TABLET | Freq: Four times a day (QID) | ORAL | Status: DC
  Administered 2021-08-18: 1000 mg via ORAL
  Filled 2021-08-18: qty 2

## 2021-08-18 SURGICAL SUPPLY — 81 items
ALCOHOL 70% 16 OZ (MISCELLANEOUS) ×2 IMPLANT
BAG COUNTER SPONGE SURGICOUNT (BAG) IMPLANT
BAG DECANTER FOR FLEXI CONT (MISCELLANEOUS) ×2 IMPLANT
BANDAGE ESMARK 6X9 LF (GAUZE/BANDAGES/DRESSINGS) IMPLANT
BLADE SAG 18X100X1.27 (BLADE) ×2 IMPLANT
BNDG ESMARK 6X9 LF (GAUZE/BANDAGES/DRESSINGS)
BOWL SMART MIX CTS (DISPOSABLE) ×2 IMPLANT
CLSR STERI-STRIP ANTIMIC 1/2X4 (GAUZE/BANDAGES/DRESSINGS) ×4 IMPLANT
COMP FEM KNEE STD PS 9 LT (Joint) ×2 IMPLANT
COMP PATELLAR 10X35 METAL (Joint) ×2 IMPLANT
COMPONENT FEM KNEE STD PS 9 LT (Joint) IMPLANT
COMPONENT PATELLAR 10X35 METAL (Joint) IMPLANT
COOLER ICEMAN CLASSIC (MISCELLANEOUS) ×2 IMPLANT
COVER SURGICAL LIGHT HANDLE (MISCELLANEOUS) ×2 IMPLANT
CUFF TOURN SGL QUICK 34 (TOURNIQUET CUFF) ×1
CUFF TOURN SGL QUICK 42 (TOURNIQUET CUFF) IMPLANT
CUFF TRNQT CYL 34X4.125X (TOURNIQUET CUFF) ×1 IMPLANT
DERMABOND ADVANCED (GAUZE/BANDAGES/DRESSINGS) ×1
DERMABOND ADVANCED .7 DNX12 (GAUZE/BANDAGES/DRESSINGS) ×1 IMPLANT
DRAPE EXTREMITY T 121X128X90 (DISPOSABLE) ×2 IMPLANT
DRAPE HALF SHEET 40X57 (DRAPES) ×2 IMPLANT
DRAPE INCISE IOBAN 66X45 STRL (DRAPES) ×2 IMPLANT
DRAPE ORTHO SPLIT 77X108 STRL (DRAPES) ×2
DRAPE POUCH INSTRU U-SHP 10X18 (DRAPES) ×2 IMPLANT
DRAPE SURG ORHT 6 SPLT 77X108 (DRAPES) ×2 IMPLANT
DRAPE U-SHAPE 47X51 STRL (DRAPES) ×4 IMPLANT
DRSG AQUACEL AG ADV 3.5X10 (GAUZE/BANDAGES/DRESSINGS) ×2 IMPLANT
DURAPREP 26ML APPLICATOR (WOUND CARE) ×6 IMPLANT
ELECT CAUTERY BLADE 6.4 (BLADE) ×2 IMPLANT
ELECT REM PT RETURN 9FT ADLT (ELECTROSURGICAL) ×2
ELECTRODE REM PT RTRN 9FT ADLT (ELECTROSURGICAL) ×1 IMPLANT
GLOVE BIOGEL PI IND STRL 7.0 (GLOVE) ×1 IMPLANT
GLOVE BIOGEL PI IND STRL 7.5 (GLOVE) ×4 IMPLANT
GLOVE BIOGEL PI INDICATOR 7.0 (GLOVE) ×1
GLOVE BIOGEL PI INDICATOR 7.5 (GLOVE) ×4
GLOVE ECLIPSE 7.0 STRL STRAW (GLOVE) ×6 IMPLANT
GLOVE SKINSENSE NS SZ7.5 (GLOVE) ×3
GLOVE SKINSENSE STRL SZ7.5 (GLOVE) ×3 IMPLANT
GLOVE SURG UNDER LTX SZ7.5 (GLOVE) ×4 IMPLANT
GLOVE SURG UNDER POLY LF SZ7 (GLOVE) ×4 IMPLANT
GOWN STRL REIN XL XLG (GOWN DISPOSABLE) ×2 IMPLANT
GOWN STRL REUS W/ TWL LRG LVL3 (GOWN DISPOSABLE) ×1 IMPLANT
GOWN STRL REUS W/TWL LRG LVL3 (GOWN DISPOSABLE) ×1
HANDPIECE INTERPULSE COAX TIP (DISPOSABLE) ×1
HDLS TROCR DRIL PIN KNEE 75 (PIN) ×1
HOOD PEEL AWAY FLYTE STAYCOOL (MISCELLANEOUS) ×4 IMPLANT
JET LAVAGE IRRISEPT WOUND (IRRIGATION / IRRIGATOR) ×2
KIT BASIN OR (CUSTOM PROCEDURE TRAY) ×2 IMPLANT
KIT TURNOVER KIT B (KITS) ×2 IMPLANT
LAVAGE JET IRRISEPT WOUND (IRRIGATION / IRRIGATOR) ×1 IMPLANT
MANIFOLD NEPTUNE II (INSTRUMENTS) ×2 IMPLANT
MARKER SKIN DUAL TIP RULER LAB (MISCELLANEOUS) ×4 IMPLANT
NDL SPNL 18GX3.5 QUINCKE PK (NEEDLE) ×1 IMPLANT
NEEDLE SPNL 18GX3.5 QUINCKE PK (NEEDLE) ×2 IMPLANT
NS IRRIG 1000ML POUR BTL (IV SOLUTION) ×2 IMPLANT
PACK TOTAL JOINT (CUSTOM PROCEDURE TRAY) ×2 IMPLANT
PAD ARMBOARD 7.5X6 YLW CONV (MISCELLANEOUS) ×4 IMPLANT
PAD COLD SHLDR WRAP-ON (PAD) ×2 IMPLANT
PIN DRILL HDLS TROCAR 75 4PK (PIN) IMPLANT
SAW OSC TIP CART 19.5X105X1.3 (SAW) ×2 IMPLANT
SCREW FEMALE HEX FIX 25X2.5 (ORTHOPEDIC DISPOSABLE SUPPLIES) ×1 IMPLANT
SET HNDPC FAN SPRY TIP SCT (DISPOSABLE) ×1 IMPLANT
STAPLER VISISTAT 35W (STAPLE) IMPLANT
STEM TIB PERS SZ E 5D LT (Screw) ×1 IMPLANT
STEM TIBIAL 10 8-11 EF POLY LT (Joint) ×1 IMPLANT
SUCTION FRAZIER HANDLE 10FR (MISCELLANEOUS) ×1
SUCTION TUBE FRAZIER 10FR DISP (MISCELLANEOUS) ×1 IMPLANT
SUT ETHILON 2 0 FS 18 (SUTURE) IMPLANT
SUT ETHILON 3 0 PS 1 (SUTURE) ×1 IMPLANT
SUT MNCRL AB 3-0 PS2 27 (SUTURE) IMPLANT
SUT VIC AB 0 CT1 27 (SUTURE) ×2
SUT VIC AB 0 CT1 27XBRD ANBCTR (SUTURE) ×2 IMPLANT
SUT VIC AB 1 CTX 27 (SUTURE) ×6 IMPLANT
SUT VIC AB 2-0 CT1 (SUTURE) ×1 IMPLANT
SUT VIC AB 2-0 CT1 27 (SUTURE) ×4
SUT VIC AB 2-0 CT1 TAPERPNT 27 (SUTURE) ×4 IMPLANT
SYR 50ML LL SCALE MARK (SYRINGE) ×4 IMPLANT
TOWEL GREEN STERILE (TOWEL DISPOSABLE) ×2 IMPLANT
TOWEL GREEN STERILE FF (TOWEL DISPOSABLE) ×2 IMPLANT
UNDERPAD 30X36 HEAVY ABSORB (UNDERPADS AND DIAPERS) ×2 IMPLANT
YANKAUER SUCT BULB TIP NO VENT (SUCTIONS) ×4 IMPLANT

## 2021-08-18 NOTE — Op Note (Signed)
? ?Total Knee Arthroplasty Procedure Note ? ?Preoperative diagnosis: Left knee osteoarthritis ? ?Postoperative diagnosis:same ? ?Operative procedure: Left total knee arthroplasty. CPT [phone] ? ?Surgeon: N. Glee Arvin, MD ? ?Assist: Oneal Grout, PA-C; necessary for the timely completion of procedure and due to complexity of procedure. ? ?Anesthesia: Spinal, regional ? ?Tourniquet time: 39 mins ? ?Implants used: Zimmer persona pressfit ?Femur: CR 9 ?Tibia: E ?Patella: 35 mm ?Polyethylene: 10 mm, MC ? ?Indication: ?Marcus [name] is a 61 y.o. year old male with a history of knee pain. Having failed conservative management, the patient elected to proceed with a total knee arthroplasty.  We have reviewed the risk and benefits of the surgery and they elected to proceed after voicing understanding. ? ?Procedure: ? ?After informed consent was obtained and understanding of the risk were voiced including but not limited to bleeding, infection, damage to surrounding structures including nerves and vessels, blood clots, leg length inequality and the failure to achieve desired results, the operative extremity was marked with verbal confirmation of the patient in the holding area.   ?The patient was then brought to the operating room and transported to the operating room table in the supine position.  A tourniquet was applied to the operative extremity around the upper thigh. The operative limb was then prepped and draped in the usual sterile fashion and preoperative antibiotics were administered.  A time out was performed prior to the start of surgery confirming the correct extremity, preoperative antibiotic administration, as well as team members, implants and instruments available for the case. Correct surgical site was also confirmed with preoperative radiographs. ?The limb was then elevated for exsanguination and the tourniquet was inflated. A midline incision was made and a standard medial parapatellar approach  was performed.  The patella was prepared and sized to a 35 mm.  A cover was placed on the patella for protection from retractors.  We then turned our attention to the femur. Posterior cruciate ligament was sacrificed. Start site was drilled in the femur and the intramedullary distal femoral cutting guide was placed, set at 5 degrees valgus, taking 10 mm of distal resection. The distal cut was made. Osteophytes were then removed.   ?Next, the proximal tibial cutting guide was placed with appropriate slope, varus/valgus alignment and depth of resection. The proximal tibial cut was made taking 4 mm off the low side. Gap blocks were then used to assess the extension gap and alignment, and appropriate soft tissue releases were performed. ?Attention was turned back to the femur, which was sized using the sizing guide to a size 9. Appropriate rotation of the femoral component was determined using epicondylar axis, Whiteside?s line, and assessing the flexion gap under ligament tension. The appropriate size 4-in-1 cutting block was placed and cuts were made.  Posterior femoral osteophytes and uncapped bone were then removed with the curved osteotome.  Trial components were placed, and stability was checked in full extension, mid-flexion, and deep flexion. Proper tibial rotation was determined and marked.  The patella tracked well without a lateral release. ?Trial components were then removed and tibial preparation performed.  The tibia was sized for a size E component.  ?The bony surfaces were irrigated with a pulse lavage and then dried. The stability of the construct was re-evaluated throughout a range of motion and found to be acceptable. The trial liner was removed, the knee was copiously irrigated, and the knee was re-evaluated for any excess bone debris. The real polyethylene liner, 10 mm thick, was  inserted and checked to ensure the locking mechanism had engaged appropriately. The tourniquet was deflated and  hemostasis was achieved. The wound was irrigated with normal saline.  One gram of vancomycin powder was placed in the surgical bed.  Topical 0.25% bupivacaine and meloxicam was placed in the joint for postoperative pain.  Capsular closure was performed with a #1 vicryl, subcutaneous fat closed with a 0 vicryl suture, then subcutaneous tissue closed with interrupted 2.0 vicryl suture. The skin was then closed with a 2.0 nylon and dermabond. A sterile dressing was applied.  The patient was awakened in the operating room and taken to recovery in stable condition. All sponge, needle, and instrument counts were correct at the end of the case. ? ?Marcus [name] was necessary for opening, closing, retracting, limb positioning and overall facilitation and completion of the surgery. ? ?Position: supine  ?Complications: none.  ?Time Out: performed  ? ?Drains/Packing: none ? ?Estimated blood loss: minimal ? ?Returned to Recovery Room: in good condition.  ? ?Antibiotics: yes  ? ?Mechanical VTE (DVT) Prophylaxis: sequential compression devices, TED thigh-high  ?Chemical VTE (DVT) Prophylaxis: aspirin ? ?Fluid Replacement  ?Crystalloid: see anesthesia record ?Blood: none  ?FFP: none  ? ?Specimens Removed: 1 to pathology  ? ?Sponge and Instrument Count Correct? yes  ? ?PACU: portable radiograph - knee AP and Lateral  ? ?Plan/RTC: Return in 2 weeks for wound check.  ? ?Weight Bearing/Load Lower Extremity: full  ? ?Implant Name Type Inv. Item Serial No. Manufacturer Lot No. LRB No. Used Action  ?STEM TIBIAL 10 8-11 EF POLY LT - [id number] Joint STEM TIBIAL 10 8-11 EF POLY LT  [employer]MER [employer](ORTH,TRAU,BIO,SG) [id number] Left 1 Implanted  ?COMP FEM KNEE STD PS 9 LT - [id number] Joint COMP FEM KNEE STD PS 9 LT  ZIMMER [employer]ON(ORTH,TRAU,BIO,SG) [id number] Left 1 Implanted  ?STEM TIB PERS SZ E 5D LT - [id number] Screw STEM TIB PERS SZ E 5D LT  ZIMMER [employer]ON(ORTH,TRAU,BIO,SG) [id number] Left 1 Implanted  ?COMP PATELLAR 10X35 METAL - [id number] Joint  COMP PATELLAR 10X35 METAL  ZIMMER [employer]ON(ORTH,TRAU,BIO,SG) [id number] Left 1 Implanted  ? ? ?N. Glee Arvin, MD ?Aldean Baker ?12:06 PM ? ? ?

## 2021-08-18 NOTE — Discharge Instructions (Signed)

## 2021-08-18 NOTE — H&P (Signed)
? ? ?PREOPERATIVE H&P ? ?Chief Complaint: LEFT KNEE DEGENERATIVE JOINT DISEASE ? ?HPI: ?Marcus [name] is a 61 y.o. male who presents for surgical treatment of LEFT KNEE DEGENERATIVE JOINT DISEASE.  He denies any changes in medical history. ? ?Past Medical History:  ?Diagnosis Date  ? Diabetes mellitus without complication (HCC)   ? Hypertension   ? ?Past Surgical History:  ?Procedure Laterality Date  ? ankle surgery Left 1980  ? ?Social History  ? ?Socioeconomic History  ? Marital status: Single  ?  Spouse name: Not on file  ? Number of children: Not on file  ? Years of education: Not on file  ? Highest education level: Not on file  ?Occupational History  ? Not on file  ?Tobacco Use  ? Smoking status: Every Day  ?  Packs/day: 0.50  ?  Years: 50.00  ?  Pack years: 25.00  ?  Types: Cigarettes  ?  Start date: 1969  ? Smokeless tobacco: Never  ? Tobacco comments:  ?  0.5ppd as of 03/17/21 ep  ?Vaping Use  ? Vaping Use: Never used  ?Substance and Sexual Activity  ? Alcohol use: Yes  ?  Alcohol/week: 6.0 standard drinks  ?  Types: 6 Standard drinks or equivalent per week  ? Drug use: Yes  ?  Types: Marijuana  ? Sexual activity: Not Currently  ?Other Topics Concern  ? Not on file  ?Social History Narrative  ? Not on file  ? ?Social Determinants of Health  ? ?Financial Resource Strain: Not on file  ?Food Insecurity: Not on file  ?Transportation Needs: Not on file  ?Physical Activity: Not on file  ?Stress: Not on file  ?Social Connections: Not on file  ? ?Family History  ?Problem Relation Age of Onset  ? Cancer Father   ? Lung disease Neg Hx   ? ?No Known Allergies ?Prior to Admission medications   ?Medication Sig Start Date End Date Taking? Authorizing Provider  ?albuterol (VENTOLIN HFA) 108 (90 Base) MCG/ACT inhaler Inhale 2 puffs into the lungs every 6 (six) hours as needed. 02/18/21  Yes Charlott Holler, MD  ?amLODipine (NORVASC) 5 MG tablet Take 1 tablet (5 mg total) by mouth daily. 05/26/21 08/24/21 Yes Rema Fendt, NP  ?aspirin EC 81 MG tablet Take 1 tablet (81 mg total) by mouth 2 (two) times daily. To be taken after surgery to prevent blood clots 08/11/21  Yes Cristie Hem, PA-C  ?atorvastatin (LIPITOR) 20 MG tablet Take 1 tablet (20 mg total) by mouth daily. 05/26/21 11/22/21 Yes Rema Fendt, NP  ?dapagliflozin propanediol (FARXIGA) 5 MG TABS tablet Take 1 tablet (5 mg total) by mouth daily before breakfast. 06/23/21 08/22/21 Yes Zonia Kief, Amy J, NP  ?docusate sodium (COLACE) 100 MG capsule Take 1 capsule (100 mg total) by mouth daily as needed. 08/11/21 08/11/22 Yes Cristie Hem, PA-C  ?lisinopril (ZESTRIL) 10 MG tablet Take 1 tablet (10 mg total) by mouth daily. 05/26/21 08/24/21 Yes Zonia Kief, Amy J, NP  ?metFORMIN (GLUCOPHAGE) 1000 MG tablet Take 1 tablet (1,000 mg total) by mouth 2 (two) times daily with a meal. ?Patient taking differently: Take 500 mg by mouth daily. 05/26/21 08/24/21 Yes Zonia Kief, Amy J, NP  ?methocarbamol (ROBAXIN) 500 MG tablet Take 1 tablet (500 mg total) by mouth 2 (two) times daily as needed. 08/11/21  Yes Cristie Hem, PA-C  ?ondansetron (ZOFRAN) 4 MG tablet Take 1 tablet (4 mg total) by mouth every 8 (eight) hours as needed  for nausea or vomiting. 08/11/21   Cristie Hem, PA-C  ?oxyCODONE-acetaminophen (PERCOCET) 5-325 MG tablet Take 1 tablet by mouth every 4 (four) hours as needed. To be taken after surgery 08/11/21  Yes Cristie Hem, PA-C  ?sulfamethoxazole-trimethoprim (BACTRIM DS) 800-160 MG tablet Take 1 tablet by mouth 2 (two) times daily. To be taken after surgery 08/11/21  Yes Cristie Hem, PA-C  ?traMADol (ULTRAM) 50 MG tablet Take 1 tablet (50 mg total) by mouth every 12 (twelve) hours as needed. 06/10/21  Yes Cristie Hem, PA-C  ?umeclidinium-vilanterol (ANORO ELLIPTA) 62.5-25 MCG/ACT AEPB Inhale 1 puff into the lungs daily. 03/17/21  Yes Charlott Holler, MD  ?predniSONE (STERAPRED UNI-PAK 21 TAB) 5 MG (21) TBPK tablet Take as directed ?Patient not taking: Reported  on 08/01/2021 06/10/21   Cristie Hem, PA-C  ? ? ? ?Positive ROS: All other systems have been reviewed and were otherwise negative with the exception of those mentioned in the HPI and as above. ? ?Physical Exam: ?General: Alert, no acute distress ?Cardiovascular: No pedal edema ?Respiratory: No cyanosis, no use of accessory musculature ?GI: abdomen soft ?Skin: No lesions in the area of chief complaint ?Neurologic: Sensation intact distally ?Psychiatric: Patient is competent for consent with normal mood and affect ?Lymphatic: no lymphedema ? ?MUSCULOSKELETAL: exam stable ? ?Assessment: ?LEFT KNEE DEGENERATIVE JOINT DISEASE ? ?Plan: ?Plan for Procedure(s): ?LEFT TOTAL KNEE ARTHROPLASTY ? ?The risks benefits and alternatives were discussed with the patient including but not limited to the risks of nonoperative treatment, versus surgical intervention including infection, bleeding, nerve injury,  blood clots, cardiopulmonary complications, morbidity, mortality, among others, and they were willing to proceed.  ? ?Preoperative templating of the joint replacement has been completed, documented, and submitted to the Operating Room personnel in order to optimize intra-operative equipment management. ? ? ?Glee Arvin, MD ?08/18/2021 ?10:21 AM ? ?

## 2021-08-18 NOTE — Anesthesia Procedure Notes (Deleted)
Procedure Name: [place] ?Date/Time: 08/18/2021 10:38 AM ?Performed by: Erick Colace, CRNA ?Pre-anesthesia Checklist: Patient identified, Emergency Drugs available, Suction available and Patient being monitored ?Patient Re-evaluated:Patient Re-evaluated prior to induction ?Oxygen Delivery Method: Simple face mask ?Preoxygenation: Pre-oxygenation with 100% oxygen ?Induction Type: IV induction ? ? ? ? ?

## 2021-08-18 NOTE — Evaluation (Signed)
Physical Therapy Evaluation ?Patient Details ?Name: Marcus [name] ?MRN: [id number] ?DOB: June 27, 1960 ?Today's Date: 08/18/2021 ? ?History of Present Illness ? Pt is 61 yo male s/p L TKA on 08/18/21.  Pt with hx including DM, L ankle sx, HTN  ?Clinical Impression ? Pt is s/p TKA resulting in the deficits listed below (see PT Problem List). At baseline, pt independent and working.  Pt evaluated on DOS and doing very well.  Reports pain at 8/10 but no signs/symptoms and eager to mobilize.  Pt with good quad activation and ROM L knee 5 to 90 degrees.  Ambulated 71' with RW and min guard.  Pt expected to progress very well.  Has RW at home and plans to stay with sister at d/c.  Pt will benefit from skilled PT to increase their independence and safety with mobility to allow discharge to the venue listed below.  ?   ?   ? ?Recommendations for follow up therapy are one component of a multi-disciplinary discharge planning process, led by the attending physician.  Recommendations may be updated based on patient status, additional functional criteria and insurance authorization. ? ?Follow Up Recommendations Follow physician's recommendations for discharge plan and follow up therapies ? ?  ?Assistance Recommended at Discharge Intermittent Supervision/Assistance  ?Patient can return home with the following ? A little help with walking and/or transfers;A little help with bathing/dressing/bathroom;Help with stairs or ramp for entrance ? ?  ?Equipment Recommendations None recommended by PT  ?Recommendations for Other Services ?    ?  ?Functional Status Assessment Patient has had a recent decline in their functional status and demonstrates the ability to make significant improvements in function in a reasonable and predictable amount of time.  ? ?  ?Precautions / Restrictions Precautions ?Precautions: Fall ?Restrictions ?Weight Bearing Restrictions: Yes ?LLE Weight Bearing: Weight bearing as tolerated  ? ?  ? ?Mobility ? Bed  Mobility ?Overal bed mobility: Needs Assistance ?Bed Mobility: Supine to Sit, Sit to Supine ?  ?  ?Supine to sit: Supervision ?  ?  ?  ?  ? ?Transfers ?Overall transfer level: Needs assistance ?Equipment used: Rolling walker (2 wheels) ?Transfers: Sit to/from Stand ?Sit to Stand: Min guard ?  ?  ?  ?  ?  ?General transfer comment: cues for hand placement and L LE management ?  ? ?Ambulation/Gait ?Ambulation/Gait assistance: Min guard ?Gait Distance (Feet): 80 Feet ?Assistive device: Rolling walker (2 wheels) ?Gait Pattern/deviations: Step-to pattern, Decreased stride length, Step-through pattern ?Gait velocity: decreased ?  ?  ?General Gait Details: Cues for sequencing initially and pt progressing from step to to step through pattern; only slight decrease in stance time on L ? ?Stairs ?  ?  ?  ?  ?  ? ?Wheelchair Mobility ?  ? ?Modified Rankin (Stroke Patients Only) ?  ? ?  ? ?Balance Overall balance assessment: Needs assistance ?Sitting-balance support: No upper extremity supported ?Sitting balance-Leahy Scale: Good ?  ?  ?Standing balance support: No upper extremity supported, Bilateral upper extremity supported ?Standing balance-Leahy Scale: Fair ?Standing balance comment: RW to ambulate but could do ADLs without UE support ?  ?  ?  ?  ?  ?  ?  ?  ?  ?  ?  ?   ? ? ? ?Pertinent Vitals/Pain Pain Assessment ?Pain Assessment: 0-10 ?Pain Score: 8  ?Pain Location: L knee ?Pain Intervention(s): Limited activity within patient's tolerance, Monitored during session, Ice applied  ? ? ?Home Living Family/patient expects to be discharged to::  Private residence ?Living Arrangements: Alone ?Available Help at Discharge: Family;Available 24 hours/day (pt going to stay w sister) ?Type of Home: House ?Home Access: Stairs to enter ?Entrance Stairs-Rails: Right;Left ?Entrance Stairs-Number of Steps: 4-6 ?  ?Home Layout: One level ?Home Equipment: Agricultural consultantolling Walker (2 wheels) ?Additional Comments: Information above is on sisters house   ?  ?Prior Function Prior Level of Function : Independent/Modified Independent;Driving;Working/employed ?  ?  ?  ?  ?  ?  ?Mobility Comments: Could ambulate in community; works at [employer] ?ADLs Comments: Independent with ADLs and IADLs ?  ? ? ?Hand Dominance  ?   ? ?  ?Extremity/Trunk Assessment  ? Upper Extremity Assessment ?Upper Extremity Assessment: Overall WFL for tasks assessed ?  ? ?Lower Extremity Assessment ?Lower Extremity Assessment: LLE deficits/detail;RLE deficits/detail ?RLE Deficits / Details: ROM WFL; MMT 5/5 ?LLE Deficits / Details: Expected post op changes; ROM: ankle and hip WFL, knee 5 to 90; MMT: ankle 5/5, hip and knee 3/5; able to SLR without ext lag ?  ? ?Cervical / Trunk Assessment ?Cervical / Trunk Assessment: Normal  ?Communication  ? Communication: No difficulties  ?Cognition Arousal/Alertness: Awake/alert ?Behavior During [facility] for tasks assessed/performed ?Overall Cognitive Status: Within Functional Limits for tasks assessed ?  ?  ?  ?  ?  ?  ?  ?  ?  ?  ?  ?  ?  ?  ?  ?  ?  ?  ?  ? ?  ?General Comments General comments (skin integrity, edema, etc.): Encouraged to do ankle pumps and quad sets tonight ? ?  ?Exercises    ? ?Assessment/Plan  ?  ?PT Assessment Patient needs continued PT services  ?PT Problem List Decreased strength;Decreased mobility;Decreased range of motion;Decreased activity tolerance;Decreased balance;Decreased knowledge of use of DME;Pain ? ?   ?  ?PT Treatment Interventions Therapeutic activities;DME instruction;Modalities;Gait training;Therapeutic exercise;Stair training;Patient/family education;Neuromuscular re-education;Functional mobility training;Balance training   ? ?PT Goals (Current goals can be found in the Care Plan section)  ?Acute Rehab PT Goals ?Patient Stated Goal: return home ?PT Goal Formulation: With patient/family ?Time For Goal Achievement: 09/01/21 ?Potential to Achieve Goals: Good ? ?  ?Frequency 7X/week ?  ? ? ?Co-evaluation   ?  ?  ?   ?  ? ? ?  ?AM-PAC PT "6 Clicks" Mobility  ?Outcome Measure Help needed turning from your back to your side while in a flat bed without using bedrails?: None ?Help needed moving from lying on your back to sitting on the side of a flat bed without using bedrails?: None ?Help needed moving to and from a bed to a chair (including a wheelchair)?: A Little ?Help needed standing up from a chair using your arms (e.g., wheelchair or bedside chair)?: A Little ?Help needed to walk in hospital room?: A Little ?Help needed climbing 3-5 steps with a railing? : A Little ?6 Click Score: 20 ? ?  ?End of Session Equipment Utilized During Treatment: Gait belt ?Activity Tolerance: Patient tolerated treatment well ?Patient left: in bed;with call bell/phone within reach;with bed alarm set ?Nurse Communication: Mobility status ?PT Visit Diagnosis: Other abnormalities of gait and mobility (R26.89);Muscle weakness (generalized) (M62.81) ?  ? ?Time: [id number] ?PT Time Calculation (min) (ACUTE ONLY): 26 min ? ? ?Charges:   PT Evaluation ?$PT Eval Low Complexity: 1 Low ?PT Treatments ?$Gait Training: 8-22 mins ?  ?   ? ? ?Anise Salvoacia, PT ?Acute Rehab Services ?Pager [phone] ?[facility] [phone] ? ? ?Billey Changacia H Moneisha Vosler ?08/18/2021,  4:06 PM ? ?

## 2021-08-18 NOTE — Anesthesia Procedure Notes (Signed)
Anesthesia Regional Block: Adductor canal block  ? ?Pre-Anesthetic Checklist: , timeout performed,  Correct Patient, Correct Site, Correct Laterality,  Correct Procedure, Correct Position, site marked,  Risks and benefits discussed,  Surgical consent,  Pre-op evaluation,  At surgeon's request and post-op pain management ? ?Laterality: Left ? ?Prep: chloraprep     ?  ?Needles:  ?Injection technique: Single-shot ? ?Needle Type: Echogenic Stimulator Needle   ? ? ?Needle Length: 10cm  ?Needle Gauge: 20  ? ? ? ?Additional Needles: ? ? ?Procedures:,,,, ultrasound used (permanent image in chart),,    ?Narrative:  ?Start time: 08/18/2021 9:15 AM ?End time: 08/18/2021 9:20 AM ?Injection made incrementally with aspirations every 5 mL. ? ?Performed by: Personally  ?Anesthesiologist: Mellody Dance, MD ? ?Additional Notes: ?Functioning IV was confirmed and monitors were applied.  Sterile prep and drape,hand hygiene and sterile gloves were used. Ultrasound guidance: relevant anatomy identified, needle position confirmed, local anesthetic spread visualized around nerve(s)., vascular puncture avoided. Negative aspiration and negative test dose prior to incremental administration of local anesthetic. The patient tolerated the procedure well. ? ? ? ? ? ? ?

## 2021-08-18 NOTE — Transfer of Care (Signed)
Immediate Anesthesia Transfer of Care Note ? ?Patient: Marcus [name] ? ?Procedure(s) Performed: LEFT TOTAL KNEE ARTHROPLASTY (Left: Knee) ? ?Patient Location: PACU ? ?Anesthesia Type:General ? ?Level of Consciousness: awake ? ?Airway & Oxygen Therapy: Patient Spontanous Breathing ? ?Post-op Assessment: Report given to RN and Post -op Vital signs reviewed and stable ? ?Post vital signs: Reviewed and stable ? ?Last Vitals:  ?Vitals Value Taken Time  ?BP 158/111 08/18/21 1239  ?Temp    ?Pulse 76 08/18/21 1240  ?Resp 14 08/18/21 1240  ?SpO2 99 % 08/18/21 1240  ?Vitals shown include unvalidated device data. ? ?Last Pain:  ?Vitals:  ? 08/18/21 0851  ?PainSc: 8   ?   ? ?  ? ?Complications: No notable events documented. ?

## 2021-08-18 NOTE — Anesthesia Procedure Notes (Signed)
Procedure Name: LMA Insertion ?Date/Time: 08/18/2021 10:44 AM ?Performed by: Erick Colace, CRNA ?Pre-anesthesia Checklist: Patient identified, Emergency Drugs available, Suction available and Patient being monitored ?Patient Re-evaluated:Patient Re-evaluated prior to induction ?Oxygen Delivery Method: Circle system utilized ?Preoxygenation: Pre-oxygenation with 100% oxygen ?Induction Type: IV induction ?Ventilation: Mask ventilation without difficulty ?LMA: LMA inserted ?LMA Size: 4.0 ?Laser Tube: Cuffed inflated with minimal occlusive pressure - saline ?Placement Confirmation: positive ETCO2 and breath sounds checked- equal and bilateral ?Tube secured with: Tape ?Dental Injury: Teeth and Oropharynx as per pre-operative assessment  ? ? ? ? ?

## 2021-08-18 NOTE — Plan of Care (Signed)
  Problem: Education: Goal: Knowledge of General Education information will improve Description: Including pain rating scale, medication(s)/side effects and non-pharmacologic comfort measures Outcome: Progressing   Problem: Clinical Measurements: Goal: Will remain free from infection Outcome: Progressing   Problem: Activity: Goal: Risk for activity intolerance will decrease Outcome: Progressing   Problem: Nutrition: Goal: Adequate nutrition will be maintained Outcome: Progressing   Problem: Elimination: Goal: Will not experience complications related to bowel motility Outcome: Progressing   Problem: Pain Managment: Goal: General experience of comfort will improve Outcome: Progressing   

## 2021-08-18 NOTE — Anesthesia Preprocedure Evaluation (Addendum)
Anesthesia Evaluation  ?Patient identified by MRN, date of birth, ID band ?Patient awake ? ? ? ?Reviewed: ?Allergy & Precautions, NPO status , Patient's Chart, lab work & pertinent test results ? ?Airway ?Mallampati: II ? ?TM Distance: >3 FB ?Neck ROM: Full ? ? ? Dental ? ?(+) Poor Dentition, Missing ?Multiple missing and decayed teeth:   ?Pulmonary ?neg pulmonary ROS, Current Smoker and Patient abstained from smoking.,  ?  ?Pulmonary exam normal ?breath sounds clear to auscultation ? ? ? ? ? ? Cardiovascular ?hypertension, Pt. on medications ?Normal cardiovascular exam ?Rhythm:Regular Rate:Normal ? ? ?  ?Neuro/Psych ?negative neurological ROS ? negative psych ROS  ? GI/Hepatic ?negative GI ROS, (+)  ?  ? substance abuse ? marijuana use,   ?Endo/Other  ?diabetes, Type 2 ? Renal/GU ?negative Renal ROS  ?negative genitourinary ?  ?Musculoskeletal ? ?(+) Arthritis , Osteoarthritis,   ? Abdominal ?  ?Peds ?negative pediatric ROS ?(+)  Hematology ?negative hematology ROS ?(+)   ?Anesthesia Other Findings ? ? Reproductive/Obstetrics ?negative OB ROS ? ?  ? ? ? ? ? ? ? ? ? ? ? ? ? ?  ?  ? ? ? ? ? ?Anesthesia Physical ?Anesthesia Plan ? ?ASA: 3 ? ?Anesthesia Plan: MAC, Regional and Spinal  ? ?Post-op Pain Management: Minimal or no pain anticipated and Tylenol PO (pre-op)*  ? ?Induction: Intravenous ? ?PONV Risk Score and Plan: 1 and Propofol infusion, TIVA, Treatment may vary due to age or medical condition, Midazolam and Ondansetron ? ?Airway Management Planned: Natural Airway and Simple Face Mask ? ?Additional Equipment: None ? ?Intra-op Plan:  ? ?Post-operative Plan: Extubation in OR ? ?Informed Consent: I have reviewed the patients History and Physical, chart, labs and discussed the procedure including the risks, benefits and alternatives for the proposed anesthesia with the patient or authorized representative who has indicated his/her understanding and acceptance.  ? ? ? ?Dental  advisory given ? ?Plan Discussed with: CRNA, Anesthesiologist and Surgeon ? ?Anesthesia Plan Comments: (Adductor block. Spinal. GA/LMA as backup plan. Norton Blizzard, MD  ?)  ? ? ? ? ? ?Anesthesia Quick Evaluation ? ?

## 2021-08-18 NOTE — Anesthesia Postprocedure Evaluation (Signed)
Anesthesia Post Note ? ?Patient: Marcus [name] ? ?Procedure(s) Performed: LEFT TOTAL KNEE ARTHROPLASTY (Left: Knee) ? ?  ? ?Patient location during evaluation: PACU ?Anesthesia Type: Regional and General ?Level of consciousness: awake ?Pain management: pain level controlled ?Vital Signs Assessment: post-procedure vital signs reviewed and stable ?Respiratory status: spontaneous breathing and respiratory function stable ?Cardiovascular status: stable ?Postop Assessment: no apparent nausea or vomiting ?Anesthetic complications: no ? ? ?No notable events documented. ? ?Last Vitals:  ?Vitals:  ? 08/18/21 1425 08/18/21 1436  ?BP: (!) 149/80 (!) 160/89  ?Pulse: 79 77  ?Resp: 13 16  ?Temp: 36.6 ?C 36.6 ?C  ?SpO2: 97% 97%  ?  ?Last Pain:  ?Vitals:  ? 08/18/21 1436  ?TempSrc: Oral  ?PainSc:   ? ? ?  ?  ?  ?  ?  ?  ? ?Mellody Dance ? ? ? ? ?

## 2021-08-19 ENCOUNTER — Encounter (HOSPITAL_COMMUNITY): Payer: Self-pay | Admitting: Orthopaedic Surgery

## 2021-08-19 ENCOUNTER — Other Ambulatory Visit: Payer: Self-pay | Admitting: Physician Assistant

## 2021-08-19 DIAGNOSIS — M1712 Unilateral primary osteoarthritis, left knee: Secondary | ICD-10-CM | POA: Diagnosis not present

## 2021-08-19 LAB — COMPREHENSIVE METABOLIC PANEL
ALT: 11 U/L (ref 0–44)
AST: 16 U/L (ref 15–41)
Albumin: 3.1 g/dL — ABNORMAL LOW (ref 3.5–5.0)
Alkaline Phosphatase: 51 U/L (ref 38–126)
Anion gap: 8 (ref 5–15)
BUN: 6 mg/dL — ABNORMAL LOW (ref 8–23)
CO2: 24 mmol/L (ref 22–32)
Calcium: 8.7 mg/dL — ABNORMAL LOW (ref 8.9–10.3)
Chloride: 103 mmol/L (ref 98–111)
Creatinine, Ser: 0.74 mg/dL (ref 0.61–1.24)
GFR, Estimated: >60 mL/min (ref >60)
Glucose, Bld: 186 mg/dL — ABNORMAL HIGH (ref 70–99)
Potassium: 4 mmol/L (ref 3.5–5.1)
Sodium: 135 mmol/L (ref 135–145)
Total Bilirubin: 0.4 mg/dL (ref 0.3–1.2)
Total Protein: 5.5 g/dL — ABNORMAL LOW (ref 6.5–8.1)

## 2021-08-19 LAB — CBC
HCT: 35.2 % — ABNORMAL LOW (ref 39.0–52.0)
Hemoglobin: 11.9 g/dL — ABNORMAL LOW (ref 13.0–17.0)
MCH: 29.5 pg (ref 26.0–34.0)
MCHC: 33.8 g/dL (ref 30.0–36.0)
MCV: 87.1 fL (ref 80.0–100.0)
Platelets: 201 10*3/uL (ref 150–400)
RBC: 4.04 MIL/uL — ABNORMAL LOW (ref 4.22–5.81)
RDW: 14.5 % (ref 11.5–15.5)
WBC: 14.1 10*3/uL — ABNORMAL HIGH (ref 4.0–10.5)
nRBC: 0 % (ref 0.0–0.2)

## 2021-08-19 MED ORDER — HYDROMORPHONE HCL 2 MG PO TABS: 2.0000 mg | ORAL_TABLET | Freq: Three times a day (TID) | ORAL | 0 refills | Status: DC | PRN

## 2021-08-19 NOTE — Progress Notes (Signed)
Completed discharge med list. Explained discharge instructions to patient. Reviewed follow up appointments, next medication administration times, and pos-op care instructions. Patient verbalized having an understanding. All belongings to include the ice machine for his leg was in the patient's possession at the time of discharge. IV was removed. Transported the patient downstairs to discharge lounge to await transportation home. ?

## 2021-08-19 NOTE — Plan of Care (Signed)
  Problem: Education: Goal: Knowledge of General Education information will improve Description: Including pain rating scale, medication(s)/side effects and non-pharmacologic comfort measures Outcome: Progressing   Problem: Clinical Measurements: Goal: Diagnostic test results will improve Outcome: Progressing   Problem: Nutrition: Goal: Adequate nutrition will be maintained Outcome: Progressing   

## 2021-08-19 NOTE — Evaluation (Signed)
Occupational Therapy Evaluation and Discharge ?Patient Details ?Name: Marcus [name] ?MRN: [id number] ?DOB: [birth year] ?Today's Date: 08/19/2021 ? ? ?History of Present Illness Pt is 61 yo male s/p L TKA on 08/18/21.  Pt with hx including DM, L ankle sx, HTN  ? ?Clinical Impression ?  ?This 61 yo male admitted and underwent above presents to acute OT with all education completed and pt is functioning at a Mod I level from a RW. No further OT needs, we will sign off.  ?   ? ?Recommendations for follow up therapy are one component of a multi-disciplinary discharge planning process, led by the attending physician.  Recommendations may be updated based on patient status, additional functional criteria and insurance authorization.  ? ?Follow Up Recommendations ? No OT follow up  ?  ?Assistance Recommended at Discharge PRN  ?Patient can return home with the following Assistance with cooking/housework ? ?  ?Functional Status Assessment ? Patient has had a recent decline in their functional status and demonstrates the ability to make significant improvements in function in a reasonable and predictable amount of time. (no OT follow up needed, all education completed in acute care)  ?Equipment Recommendations ? None recommended by OT  ?  ?   ?Precautions / Restrictions Precautions ?Precautions: None ?Restrictions ?Weight Bearing Restrictions: No ?LLE Weight Bearing: Weight bearing as tolerated  ? ?  ? ?Mobility Bed Mobility ?  ?  ?  ?  ?  ?  ?  ?General bed mobility comments: Pt up in recliner upon my arrival ?  ? ?Transfers ?Overall transfer level: Modified independent ?Equipment used: Rolling walker (2 wheels) ?Transfers: Sit to/from Stand ?Sit to Stand: Modified independent (Device/Increase time) ?  ?  ?  ?  ?  ?  ?  ? ?  ?Balance Overall balance assessment: Modified Independent (intermittent use fo RW in standing) ?  ?  ?  ?  ?  ?  ?  ?  ?  ?  ?  ?  ?  ?  ?  ?  ?  ?  ?   ? ?ADL either performed or assessed with clinical  judgement  ? ?ADL Overall ADL's : Modified independent (RW level) ?  ?  ?  ?  ?  ?  ?  ?  ?  ?  ?  ?  ?  ?  ?  ?  ?  ?  ?  ?General ADL Comments: Educated on stepping into tub using RW, getting dressed (which he did), use of ice machine  ? ? ? ?Vision Patient Visual Report: No change from baseline ?   ?   ?   ?   ? ?Pertinent Vitals/Pain Pain Assessment ?Pain Assessment: No/denies pain  ? ? ? ?Hand Dominance Right ?  ?Extremity/Trunk Assessment Upper Extremity Assessment ?Upper Extremity Assessment: Overall WFL for tasks assessed ?  ?  ?  ?  ?  ?Communication Communication ?Communication: No difficulties ?  ?Cognition Arousal/Alertness: Awake/alert ?Behavior During Therapy: [facility] for tasks assessed/performed ?Overall Cognitive Status: Within Functional Limits for tasks assessed ?  ?  ?  ?  ?  ?  ?  ?  ?  ?  ?  ?  ?  ?  ?  ?  ?  ?  ?  ?   ?   ?   ? ? ?Home Living Family/patient expects to be discharged to:: Private residence ?Living Arrangements: Alone ?Available Help at Discharge: Family;Available 24 hours/day (going to sister's house) ?Type  of Home: House ?Home Access: Stairs to enter ?Entrance Stairs-Number of Steps: 4-6 ?Entrance Stairs-Rails: Right;Left ?Home Layout: One level ?  ?  ?Bathroom Shower/Tub: Tub/shower unit ?  ?Bathroom Toilet: Standard ?  ?  ?Home Equipment: Agricultural consultant (2 wheels);Shower seat;Grab bars - tub/shower;BSC/3in1 ?  ?Additional Comments: Information above is on sisters house ?  ? ?  ?Prior Functioning/Environment Prior Level of Function : Independent/Modified Independent;Driving;Working/employed ?  ?  ?  ?  ?  ?  ?Mobility Comments: Could ambulate in community; works at [employer] ?ADLs Comments: Independent with ADLs and IADLs ?  ? ?  ?  ?OT Problem List: Decreased strength;Impaired balance (sitting and/or standing) ?  ?   ?   ?OT Goals(Current goals can be found in the care plan section) Acute Rehab OT Goals ?Patient Stated Goal: to go home today  ?   ? ?   ?AM-PAC OT "6 Clicks"  Daily Activity     ?Outcome Measure Help from another person eating meals?: None ?Help from another person taking care of personal grooming?: None ?Help from another person toileting, which includes using toliet, bedpan, or urinal?: None ?Help from another person bathing (including washing, rinsing, drying)?: None ?Help from another person to put on and taking off regular upper body clothing?: None ?Help from another person to put on and taking off regular lower body clothing?: None ?6 Click Score: 24 ?  ?End of Session Equipment Utilized During Treatment: Rolling walker (2 wheels) ?Nurse Communication:  (she came in and disconnected the IV for pt to donn shirt) ? ?Activity Tolerance: Patient tolerated treatment well ?Patient left: in chair;with call bell/phone within reach ? ?OT Visit Diagnosis: Other abnormalities of gait and mobility (R26.89);Muscle weakness (generalized) (M62.81)  ?              ?Time: 5110-2111 ?OT Time Calculation (min): 29 min ?Charges:  OT General Charges ?$OT Visit: 1 Visit ?OT Evaluation ?$OT Eval Moderate Complexity: 1 Mod ?OT Treatments ?$Self Care/Home Management : 8-22 mins ? ?Ignacia Palma, OTR/L ?Acute Rehab Services ?Pager [phone] ?Office [phone] ? ? ? ?Marcus [name] ?08/19/2021, 8:26 AM ?

## 2021-08-19 NOTE — Progress Notes (Signed)
Subjective: ?1 Day Post-Op Procedure(s) (LRB): ?LEFT TOTAL KNEE ARTHROPLASTY (Left) ?Patient reports pain as mild.   ? ?Objective: ?Vital signs in last 24 hours: ?Temp:  [97.4 ?F (36.3 ?C)-98.3 ?F (36.8 ?C)] 98.1 ?F (36.7 ?C) (04/25 0734) ?Pulse Rate:  [54-82] 74 (04/25 0734) ?Resp:  [11-20] 18 (04/25 0734) ?BP: (126-193)/(58-111) 141/83 (04/25 0734) ?SpO2:  [93 %-100 %] 100 % (04/25 0734) ? ?Intake/Output from previous day: ?04/24 0701 - 04/25 0700 ?In: [id number] [P.O.:560; I.V.:1000] ?Out: 530 [Urine:380; Blood:150] ?Intake/Output this shift: ?No intake/output data recorded. ? ?Recent Labs  ?  08/19/21 ?0241  ?HGB 11.9*  ? ?Recent Labs  ?  08/19/21 ?0241  ?WBC 14.1*  ?RBC 4.04*  ?HCT 35.2*  ?PLT 201  ? ?Recent Labs  ?  08/19/21 ?0241  ?NA 135  ?K 4.0  ?CL 103  ?CO2 24  ?BUN 6*  ?CREATININE 0.74  ?GLUCOSE 186*  ?CALCIUM 8.7*  ? ?No results for input(s): LABPT, INR in the last 72 hours. ? ?Neurologically intact ?Neurovascular intact ?Sensation intact distally ?Intact pulses distally ?Dorsiflexion/Plantar flexion intact ?Incision: scant drainage ?No cellulitis present ?Compartment soft ? ? ?Assessment/Plan: ?1 Day Post-Op Procedure(s) (LRB): ?LEFT TOTAL KNEE ARTHROPLASTY (Left) ?Advance diet ?Up with therapy ?D/C IV fluids ?Discharge home with home health after first pt session as long as he mobilizes well ?WBAT LLE ? ? ? ?Anticipated LOS equal to or greater than 2 midnights due to ?- Age 62 and older with one or more of the following: ? - Obesity ? - Expected need for hospital services (PT, OT, Nursing) required for safe  discharge ? - Anticipated need for postoperative skilled nursing care or inpatient rehab ? - Active co-morbidities: None ?OR  ? ?- Unanticipated findings during/Post Surgery: None  ?- Patient is a high risk of re-admission due to: None ? ? ?Aundra Dubin ?08/19/2021, 9:21 AM ? ?

## 2021-08-19 NOTE — Discharge Summary (Signed)
Patient ID: ?Marcus [name] ?MRN: [id number] ?DOB/AGE: 62/08/1960 62 y.o. ? ?Admit date: 08/18/2021 ?Discharge date: 08/19/2021 ? ?Admission Diagnoses:  ?Principal Problem: ?  Primary osteoarthritis of left knee ?Active Problems: ?  Status post total left knee replacement ? ? ?Discharge Diagnoses:  ?Same ? ?Past Medical History:  ?Diagnosis Date  ? Diabetes mellitus without complication (HCC)   ? Hypertension   ? ? ?Surgeries: Procedure(s): ?LEFT TOTAL KNEE ARTHROPLASTY on 08/18/2021 ?  ?Consultants:  ? ?Discharged Condition: Improved ? ?Hospital Course: Marcus GraffVince Moustafa is an 61 y.o. male who was admitted 08/18/2021 for operative treatment ofPrimary osteoarthritis of left knee. Patient has severe unremitting pain that affects sleep, daily activities, and work/hobbies. After pre-op clearance the patient was taken to the operating room on 08/18/2021 and underwent  Procedure(s): ?LEFT TOTAL KNEE ARTHROPLASTY.   ? ?Patient was given perioperative antibiotics:  ?Anti-infectives (From admission, onward)  ? ? Start     Dose/Rate Route Frequency Ordered Stop  ? 08/19/21 0200  ceFAZolin (ANCEF) IVPB 2g/100 mL premix       ? 2 g ?200 mL/hr over 30 Minutes Intravenous Every 6 hours 08/18/21 2116 08/19/21 0355  ? 08/18/21 1600  ceFAZolin (ANCEF) IVPB 2g/100 mL premix  Status:  Discontinued       ? 2 g ?200 mL/hr over 30 Minutes Intravenous Every 6 hours 08/18/21 1500 08/18/21 2116  ? 08/18/21 1119  vancomycin (VANCOCIN) powder  Status:  Discontinued       ?   As needed 08/18/21 1120 08/18/21 1233  ? 08/18/21 0830  ceFAZolin (ANCEF) IVPB 2g/100 mL premix       ? 2 g ?200 mL/hr over 30 Minutes Intravenous On call to O.R. 08/18/21 0815 08/18/21 1037  ? ?  ?  ? ?Patient was given sequential compression devices, early ambulation, and chemoprophylaxis to prevent DVT. ? ?Patient benefited maximally from hospital stay and there were no complications.   ? ?Recent vital signs: Patient Vitals for the past 24 hrs: ? BP Temp Temp src Pulse Resp SpO2   ?08/19/21 0734 (!) 141/83 98.1 ?F (36.7 ?C) Oral 74 18 100 %  ?08/19/21 0326 (!) 147/78 (!) 97.4 ?F (36.3 ?C) Oral 71 16 100 %  ?08/18/21 2018 (!) 161/99 98.3 ?F (36.8 ?C) Oral 79 18 100 %  ?08/18/21 1436 (!) 160/89 97.9 ?F (36.6 ?C) Oral 77 16 97 %  ?08/18/21 1425 (!) 149/80 97.8 ?F (36.6 ?C) -- 79 13 97 %  ?08/18/21 1410 (!) 145/82 -- -- 78 16 95 %  ?08/18/21 1355 (!) 154/83 -- -- 76 13 94 %  ?08/18/21 1340 (!) 154/84 -- -- 80 14 93 %  ?08/18/21 1325 (!) 157/75 -- -- 77 14 96 %  ?08/18/21 1310 (!) 173/82 -- -- 78 11 97 %  ?08/18/21 1255 (!) 193/85 -- -- 80 15 96 %  ?08/18/21 1240 (!) 158/111 97.7 ?F (36.5 ?C) -- 82 20 98 %  ?08/18/21 1000 -- -- -- (!) 54 15 100 %  ?08/18/21 0945 (!) 126/58 -- -- (!) 59 13 99 %  ?08/18/21 0930 139/68 -- -- 65 16 100 %  ?  ? ?Recent laboratory studies:  ?Recent Labs  ?  08/19/21 ?0241  ?WBC 14.1*  ?HGB 11.9*  ?HCT 35.2*  ?PLT 201  ?NA 135  ?K 4.0  ?CL 103  ?CO2 24  ?BUN 6*  ?CREATININE 0.74  ?GLUCOSE 186*  ?CALCIUM 8.7*  ? ? ? ?Discharge Medications:   ?Allergies as of 08/19/2021   ?No  Known Allergies ?  ? ?  ?Medication List  ?  ? ?STOP taking these medications   ? ?predniSONE 5 MG (21) Tbpk tablet ?Commonly known as: STERAPRED UNI-PAK 21 TAB ?  ?traMADol 50 MG tablet ?Commonly known as: ULTRAM ?  ? ?  ? ?TAKE these medications   ? ?albuterol 108 (90 Base) MCG/ACT inhaler ?Commonly known as: VENTOLIN HFA ?Inhale 2 puffs into the lungs every 6 (six) hours as needed. ?  ?amLODipine 5 MG tablet ?Commonly known as: NORVASC ?Take 1 tablet (5 mg total) by mouth daily. ?  ?Anoro Ellipta 62.5-25 MCG/ACT Aepb ?Generic drug: umeclidinium-vilanterol ?Inhale 1 puff into the lungs daily. ?  ?aspirin EC 81 MG tablet ?Take 1 tablet (81 mg total) by mouth 2 (two) times daily. To be taken after surgery to prevent blood clots ?  ?atorvastatin 20 MG tablet ?Commonly known as: LIPITOR ?Take 1 tablet (20 mg total) by mouth daily. ?  ?dapagliflozin propanediol 5 MG Tabs tablet ?Commonly known as:  FARXIGA ?Take 1 tablet (5 mg total) by mouth daily before breakfast. ?  ?docusate sodium 100 MG capsule ?Commonly known as: Colace ?Take 1 capsule (100 mg total) by mouth daily as needed. ?  ?HYDROmorphone 2 MG tablet ?Commonly known as: Dilaudid ?Take 1 tablet (2 mg total) by mouth 3 (three) times daily as needed for severe pain. To be taken for breakthrough pain.  Do not take in conjunction with percocet ?  ?lisinopril 10 MG tablet ?Commonly known as: ZESTRIL ?Take 1 tablet (10 mg total) by mouth daily. ?  ?metFORMIN 1000 MG tablet ?Commonly known as: GLUCOPHAGE ?Take 1 tablet (1,000 mg total) by mouth 2 (two) times daily with a meal. ?What changed:  ?how much to take ?when to take this ?  ?methocarbamol 500 MG tablet ?Commonly known as: Robaxin ?Take 1 tablet (500 mg total) by mouth 2 (two) times daily as needed. ?  ?ondansetron 4 MG tablet ?Commonly known as: Zofran ?Take 1 tablet (4 mg total) by mouth every 8 (eight) hours as needed for nausea or vomiting. ?  ?oxyCODONE-acetaminophen 5-325 MG tablet ?Commonly known as: Percocet ?Take 1 tablet by mouth every 4 (four) hours as needed. To be taken after surgery ?  ?sulfamethoxazole-trimethoprim 800-160 MG tablet ?Commonly known as: BACTRIM DS ?Take 1 tablet by mouth 2 (two) times daily. To be taken after surgery ?  ? ?  ? ?  ?  ? ? ?  ?Durable Medical Equipment  ?(From admission, onward)  ?  ? ? ?  ? ?  Start     Ordered  ? 08/18/21 1501  DME Walker rolling  Once       ?Question Answer Comment  ?Walker: With 5 Inch Wheels   ?Patient needs a walker to treat with the following condition Status post left partial knee replacement   ?  ? 08/18/21 1500  ? 08/18/21 1501  DME 3 n 1  Once       ? 08/18/21 1500  ? 08/18/21 1501  DME Bedside commode  Once       ?Question:  Patient needs a bedside commode to treat with the following condition  Answer:  Status post left partial knee replacement  ? 08/18/21 1500  ? ?  ?  ? ?  ? ? ?Diagnostic Studies: DG Knee Left Port ? ?Result  Date: 08/18/2021 ?CLINICAL DATA:  Status post left total knee arthroplasty. EXAM: PORTABLE LEFT KNEE - 1-2 VIEW COMPARISON:  06/10/21 FINDINGS: There are postsurgical  changes from left total knee arthroplasty. Hardware components are in anatomic alignment. Gas is identified within the suprapatellar joint space and the soft tissues along the anterior surface of the knee. IMPRESSION: Status post left total knee arthroplasty. Electronically Signed   By: Signa Kell M.D.   On: 08/18/2021 13:01   ? ?Disposition: Discharge disposition: 01-Home or Self Care ? ? ? ? ? ? ? ? ? Follow-up Information   ? ? Tarry Kos, MD. Schedule an appointment as soon as possible for a visit in 2 week(s).   ?Specialty: Orthopedic Surgery ?Contact information: ?[address] ?[place] [address] ?[phone] ? ? ?  ?  ? ? Rema Fendt, NP Follow up.   ?Specialty: Nurse Practitioner ?Contact information: ?[address] ?[address] ?[place] [address] ?[phone] ? ? ?  ?  ? ? [facility] Follow up.   ?Specialty: Home Health Services ?Why: home health services will be provided by [facility] .Start of care with 48 hours post discharge ?Contact information: ?[address] ?[address] ?[place] [address] ?[phone] ? ? ?  ?  ? ?  ?  ? ?  ? ? ? ?Signed: ?Cristie Hem ?08/19/2021, 9:26 AM ? ? ? ? ?

## 2021-08-19 NOTE — Progress Notes (Signed)
Physical Therapy Treatment ?Patient Details ?Name: Marcus [name] ?MRN: [id number] ?DOB: [birth year] ?Today's Date: 08/19/2021 ? ? ?History of Present Illness Pt is 61 yo male s/p L TKA on 08/18/21.  Pt with hx including DM, L ankle sx, HTN ? ?  ?PT Comments  ? ? Patient received in recliner, wants to go home. He is mod independent with transfers. Ambulated 200 feet with RW and normal pace, no pain reported, mod independent. Ambulated up/down 3 steps with rails with supervision. Patient is moving very well, all questions answered. Patient is ready for discharge this morning.  ?  ?Recommendations for follow up therapy are one component of a multi-disciplinary discharge planning process, led by the attending physician.  Recommendations may be updated based on patient status, additional functional criteria and insurance authorization. ? ?Follow Up Recommendations ? Follow physician's recommendations for discharge plan and follow up therapies ?  ?  ?Assistance Recommended at Discharge Intermittent Supervision/Assistance  ?Patient can return home with the following Assist for transportation ?  ?Equipment Recommendations ? None recommended by PT  ?  ?Recommendations for Other Services   ? ? ?  ?Precautions / Restrictions Precautions ?Precautions: Fall ?Restrictions ?Weight Bearing Restrictions: Yes ?LLE Weight Bearing: Weight bearing as tolerated  ?  ? ?Mobility ? Bed Mobility ?  ?  ?  ?  ?  ?  ?  ?General bed mobility comments: not assessed, patient in recliner ?  ? ?Transfers ?Overall transfer level: Modified independent ?Equipment used: Rolling walker (2 wheels) ?Transfers: Sit to/from Stand ?Sit to Stand: Modified independent (Device/Increase time) ?  ?  ?  ?  ?  ?  ?  ? ?Ambulation/Gait ?Ambulation/Gait assistance: Modified independent (Device/Increase time) ?Gait Distance (Feet): 200 Feet ?Assistive device: Rolling walker (2 wheels) ?Gait Pattern/deviations: Step-through pattern ?Gait velocity: WNL ?  ?  ?   ? ? ?Stairs ?Stairs: Yes ?Stairs assistance: Independent ?Stair Management: Two rails, Alternating pattern, Forwards ?Number of Stairs: 3 ?  ? ? ?Wheelchair Mobility ?  ? ?Modified Rankin (Stroke Patients Only) ?  ? ? ?  ?Balance Overall balance assessment: Modified Independent ?Sitting-balance support: Feet supported ?Sitting balance-Leahy Scale: Normal ?  ?  ?Standing balance support: Bilateral upper extremity supported, During functional activity ?Standing balance-Leahy Scale: Good ?Standing balance comment: patient is able to ambulate short distances without AD, no significant limping noted, advised to continue using walker for now to reduce stress on left knee ?  ?  ?  ?  ?  ?  ?  ?  ?  ?  ?  ?  ? ?  ?Cognition Arousal/Alertness: Awake/alert ?Behavior During Therapy: [facility] for tasks assessed/performed ?Overall Cognitive Status: Within Functional Limits for tasks assessed ?  ?  ?  ?  ?  ?  ?  ?  ?  ?  ?  ?  ?  ?  ?  ?  ?  ?  ?  ? ?  ?Exercises   ? ?  ?General Comments   ?  ?  ? ?Pertinent Vitals/Pain Pain Assessment ?Pain Assessment: No/denies pain ?Pain Intervention(s): Monitored during session  ? ? ?Home Living Family/patient expects to be discharged to:: Private residence ?Living Arrangements: Alone ?Available Help at Discharge: Family;Available 24 hours/day (going to sister's house) ?Type of Home: House ?Home Access: Stairs to enter ?Entrance Stairs-Rails: Right;Left ?Entrance Stairs-Number of Steps: 4-6 ?  ?Home Layout: One level ?Home Equipment: Agricultural consultant (2 wheels);Shower seat;Grab bars - tub/shower;BSC/3in1 ?Additional Comments: Information above is on sisters house  ?  ?  Prior Function    ?  ?  ?   ? ?PT Goals (current goals can now be found in the care plan section) Acute Rehab PT Goals ?Patient Stated Goal: return home ?PT Goal Formulation: With patient ?Time For Goal Achievement: 09/01/21 ?Potential to Achieve Goals: Good ?Progress towards PT goals: Progressing toward goals ? ?  ?Frequency ? ? ?  7X/week ? ? ? ?  ?PT Plan Current plan remains appropriate  ? ? ?Co-evaluation   ?  ?  ?  ?  ? ?  ?AM-PAC PT "6 Clicks" Mobility   ?Outcome Measure ? Help needed turning from your back to your side while in a flat bed without using bedrails?: None ?Help needed moving from lying on your back to sitting on the side of a flat bed without using bedrails?: None ?Help needed moving to and from a bed to a chair (including a wheelchair)?: None ?Help needed standing up from a chair using your arms (e.g., wheelchair or bedside chair)?: None ?Help needed to walk in hospital room?: A Little ?Help needed climbing 3-5 steps with a railing? : None ?6 Click Score: 23 ? ?  ?End of Session   ?Activity Tolerance: Patient tolerated treatment well ?Patient left: in chair;with call bell/phone within reach ?Nurse Communication: Mobility status ?PT Visit Diagnosis: Other abnormalities of gait and mobility (R26.89);Muscle weakness (generalized) (M62.81) ?  ? ? ?Time: 6948-5462 ?PT Time Calculation (min) (ACUTE ONLY): 11 min ? ?Charges:  $Gait Training: 8-22 mins          ?          ? ?Lissa Merlin, PT, GCS ?08/19/21,9:43 AM ? ?

## 2021-08-19 NOTE — TOC Initial Note (Signed)
Transition of Care (TOC) - Initial/Assessment Note  ? ? ?Patient Details  ?Name: Marcus [name] ?MRN: [id number] ?Date of Birth: 07-17-1960 ? ?Transition of Care [facility]) CM/SW Contact:    ?Epifanio Lesches, RN ?Phone Number: ?08/19/2021, 7:45 AM ? ?Clinical Narrative:                 ?Admitted s/p L TKA, 4/24. Pt is from home alone. States will recovery @ sister Marcus [name]'s residence: [address] [place]. Pt will d/c with home health PT/OT services. Services were prearranged with [facility] though MD office. Pt without DME needs. States received. CPM, RW and BSC prior to surgery.  ?Pt without Rx meds concern. Post hospital f/u noted on AVS. ?Sister Marcus [name] to provide transportation to home. ?TOC team will continue to monitor. Pt without present need identified..... ? ?Expected Discharge Plan: Home w Home Health Services ?  ? ? ?Patient Goals and CMS Choice ?  ?  ?  ? ?Expected Discharge Plan and Services ?Expected Discharge Plan: Home w Home Health Services ?  ?  ?  ?  ?                ?  ?  ?  ?  ?  ?HH Arranged: PT ?HH Agency: [facility] ?Date HH Agency Contacted: 08/18/21 ?Time HH Agency Contacted: 2140 ?Representative spoke with at [facility] Agency: Prearranged by MD office prior to surgery ? ?Prior Living Arrangements/Services ?  ?  ?  ?       ?  ?  ?  ?  ? ?Activities of Daily Living ?Home Assistive Devices/Equipment: CBG Meter ?ADL Screening (condition at time of admission) ?Patient's cognitive ability adequate to safely complete daily activities?: Yes ?Is the patient deaf or have difficulty hearing?: No ?Does the patient have difficulty seeing, even when wearing glasses/contacts?: No ?Does the patient have difficulty concentrating, remembering, or making decisions?: No ?Patient able to express need for assistance with ADLs?: No ?Does the patient have difficulty dressing or bathing?: No ?Independently performs ADLs?: Yes (appropriate for developmental age) ?Does the patient have difficulty  walking or climbing stairs?: No ?Weakness of Legs: None ?Weakness of Arms/Hands: Left ? ?Permission Sought/Granted ?  ?  ?   ?   ?   ?   ? ?Emotional Assessment ?  ?  ?  ?  ?  ?  ? ?Admission diagnosis:  Status post total left knee replacement [Z96.652] ?Patient Active Problem List  ? Diagnosis Date Noted  ? Primary osteoarthritis of left knee 08/18/2021  ? Status post total left knee replacement 08/18/2021  ? ?PCP:  Rema Fendt, NP ?Pharmacy:   ?[facility] [address] [place], [place] - [address] ?[address] ?[place] [address] ?Phone: [phone] Fax: [phone] ? ?[facility] [address], [place] - [address] ?[address] ?[place] [address] ?Phone: [phone] Fax: [phone] ? ? ? ? ?Social Determinants of Health (SDOH) Interventions ?  ? ?Readmission Risk Interventions ?   ? View : No data to display.  ?  ?  ?  ? ? ? ?

## 2021-08-19 NOTE — Plan of Care (Signed)
  Problem: Activity: Goal: Risk for activity intolerance will decrease Outcome: Not Progressing   Problem: Elimination: Goal: Will not experience complications related to bowel motility Outcome: Not Progressing   Problem: Pain Managment: Goal: General experience of comfort will improve Outcome: Not Progressing   Problem: Safety: Goal: Ability to remain free from injury will improve Outcome: Not Progressing   

## 2021-08-20 ENCOUNTER — Telehealth: Payer: Self-pay

## 2021-08-20 NOTE — Telephone Encounter (Signed)
Transition Care Management Follow-up Telephone Call ?Date of discharge and from where: 08/19/2021, [facility]  ?How have you been since you were released from the hospital? He said he is feeling awful due to pain. He just finished therapy and is using his CPM. He said that the pain medication is not working. I instructed him to call Dr Warren Danes office and he said he has the phone number and will call.  ?Any questions or concerns? Yes -noted above ? ?Items Reviewed: ?Did the pt receive and understand the discharge instructions provided? Yes  ?Medications obtained and verified? Yes  - he said he has all of his medications and did not have any questions about his med regime  ?Other? No  ?Any new allergies since your discharge? No  ?Dietary orders reviewed? No ?Do you have support at home? Yes  - he is staying with his sister. ? ?Home Care and Equipment/Supplies: ?Were home health services ordered? yes ?If so, what is the name of the agency? [facility]  ?Has the agency set up a time to come to the patient's home? Yes - the therapist was there today.  ?Were any new equipment or medical supplies ordered?  Yes: RW, BSC, CPM ?What is the name of the medical supply agency? Arranged by the surgeon's office ?Were you able to get the supplies/equipment? yes ?Do you have any questions related to the use of the equipment or supplies? No ? ?Functional Questionnaire: (I = Independent and D = Dependent) ?ADLs: using RW with ambulation. WBAT LLE, he stated that he is independent with personal care.  ? ?Follow up appointments reviewed: ? ?PCP Hospital f/u appt confirmed? Yes  Scheduled to see Ricky Stabs, NP - 09/13/2030.  ?Specialist Hospital f/u appt confirmed? Yes  Scheduled to see Dr Roda Shutters- 09/02/2021.  ?Are transportation arrangements needed? No  ?If their condition worsens, is the pt aware to call PCP or go to the Emergency Dept.? Yes ?Was the patient provided with contact information for the PCP's office or ED? Yes ?Was to pt  encouraged to call back with questions or concerns? Yes ? ?

## 2021-08-28 ENCOUNTER — Telehealth: Payer: Self-pay | Admitting: Orthopaedic Surgery

## 2021-08-28 NOTE — Telephone Encounter (Signed)
Pt needs a refill on Pain Medication  

## 2021-08-29 ENCOUNTER — Other Ambulatory Visit: Payer: Self-pay | Admitting: Physician Assistant

## 2021-08-29 MED ORDER — OXYCODONE-ACETAMINOPHEN 5-325 MG PO TABS: 1.0000 | ORAL_TABLET | Freq: Four times a day (QID) | ORAL | 0 refills | Status: DC | PRN

## 2021-08-29 NOTE — Telephone Encounter (Signed)
Sent in percocet

## 2021-09-02 ENCOUNTER — Encounter: Payer: Self-pay | Admitting: Orthopaedic Surgery

## 2021-09-02 ENCOUNTER — Ambulatory Visit (INDEPENDENT_AMBULATORY_CARE_PROVIDER_SITE_OTHER): Payer: 59 | Admitting: Orthopaedic Surgery

## 2021-09-02 DIAGNOSIS — Z96652 Presence of left artificial knee joint: Secondary | ICD-10-CM

## 2021-09-02 MED ORDER — TRAMADOL HCL 50 MG PO TABS: 50.0000 mg | ORAL_TABLET | Freq: Every day | ORAL | 0 refills | Status: DC | PRN

## 2021-09-02 NOTE — Progress Notes (Signed)
? ?  Post-Op Visit Note ?  ?Patient: Marcus [name]           ?Date of Birth: 03/03/1961           ?MRN: [id number] ?Visit Date: 09/02/2021 ?PCP: Rema Fendt, NP ? ? ?Assessment & Plan: ? ?Chief Complaint:  ?Chief Complaint  ?Patient presents with  ? Left Knee - Pain  ? ?Visit Diagnoses:  ?1. Status post total left knee replacement   ? ? ?Plan: Caelum is 2 weeks status post left total knee replacement on 08/18/2021.  He has 1 more session of home health PT.  Overall no real complaints other than soreness and swelling. ? ?Examination left knee shows healed surgical incision with intact sutures.  No drainage or signs of infection.  Expected moderate swelling.  Range of motion is acceptable. ? ?Sutures removed Steri-Strips applied.  Dental prophylaxis reinforced.  Implant card provided.  Outpatient physical therapy referral made.  Tramadol prescribed.  He will also continue take over-the-counter medications.  Follow-up in 4 weeks with two-view x-rays of the left knee. ? ?Follow-Up Instructions: No follow-ups on file.  ? ?Orders:  ?Orders Placed This Encounter  ?Procedures  ? Ambulatory referral to Physical Therapy  ? ?Meds ordered this encounter  ?Medications  ? traMADol (ULTRAM) 50 MG tablet  ?  Sig: Take 1-2 tablets (50-100 mg total) by mouth daily as needed.  ?  Dispense:  20 tablet  ?  Refill:  0  ? ? ?Imaging: ?No results found. ? ?PMFS History: ?Patient Active Problem List  ? Diagnosis Date Noted  ? Primary osteoarthritis of left knee 08/18/2021  ? Status post total left knee replacement 08/18/2021  ? ?Past Medical History:  ?Diagnosis Date  ? Diabetes mellitus without complication (HCC)   ? Hypertension   ?  ?Family History  ?Problem Relation Age of Onset  ? Cancer Father   ? Lung disease Neg Hx   ?  ?Past Surgical History:  ?Procedure Laterality Date  ? ankle surgery Left 1980  ? TOTAL KNEE ARTHROPLASTY Left 08/18/2021  ? Procedure: LEFT TOTAL KNEE ARTHROPLASTY;  Surgeon: Tarry Kos, MD;  Location: [facility] OR;   Service: Orthopedics;  Laterality: Left;  ? ?Social History  ? ?Occupational History  ? Not on file  ?Tobacco Use  ? Smoking status: Every Day  ?  Packs/day: 0.50  ?  Years: 50.00  ?  Pack years: 25.00  ?  Types: Cigarettes  ?  Start date: 1969  ? Smokeless tobacco: Never  ? Tobacco comments:  ?  0.5ppd as of 03/17/21 ep  ?Vaping Use  ? Vaping Use: Never used  ?Substance and Sexual Activity  ? Alcohol use: Yes  ?  Alcohol/week: 6.0 standard drinks  ?  Types: 6 Standard drinks or equivalent per week  ? Drug use: Yes  ?  Types: Marijuana  ? Sexual activity: Not Currently  ? ? ? ?

## 2021-09-03 ENCOUNTER — Telehealth: Payer: Self-pay | Admitting: *Deleted

## 2021-09-03 NOTE — Telephone Encounter (Signed)
Got a call from [facility] (this is not a bundle patient). States they were discharging and asked patient about his aspirin and he states he has not taken aspirin at all in the post op period. I see where it was prescribed before surgery. Want me to call and tell him to start now or what recommendations would you like at 16 days post-op?  ?

## 2021-09-03 NOTE — Telephone Encounter (Signed)
Yes please call him and have him start it and take it for the next month.  Thanks.

## 2021-09-04 ENCOUNTER — Other Ambulatory Visit: Payer: Self-pay | Admitting: Physician Assistant

## 2021-09-04 NOTE — Telephone Encounter (Signed)
I sent this in prior to sx with all other post-op meds, and specifically put on there to take after surgery to prevent blood clots so they don't just think they can take if they need to.  I also tell them prior to d/c.  Not sure why he has not taken this, but thank you for calling to let him know to start taking it

## 2021-09-04 NOTE — Progress Notes (Signed)
TRANSITION OF CARE VISIT   Primary Care Provider (PCP):      Ricky Stabs, NP                                                        [facility] [address] [place],  [place]  [address] Phone: [phone] Fax: [phone]     Date of Admission: 08/18/2021  Date of Discharge: 08/19/2021  Transitions of Care Call: 08/20/2021  Discharged from: [facility]   Discharge Diagnosis:  Admission Diagnoses:  Principal Problem:   Primary osteoarthritis of left knee Active Problems:   Status post total left knee replacement Discharge Diagnoses:  Same   Summary of Admission per PA note: Hospital Course: Marcus [name] is an 61 y.o. male who was admitted 08/18/2021 for operative treatment ofPrimary osteoarthritis of left knee. Patient has severe unremitting pain that affects sleep, daily activities, and work/hobbies. After pre-op clearance the patient was taken to the operating room on 08/18/2021 and underwent  Procedure(s): LEFT TOTAL KNEE ARTHROPLASTY.   Patient was given sequential compression devices, early ambulation, and chemoprophylaxis to prevent DVT. Patient benefited maximally from hospital stay and there were no complications.    Follow-Ups Schedule an appointment with Marcus Kos, MD (Orthopedic Surgery) in 2 weeks (09/02/2021) Follow up with [facility]); home health services will be provided by [facility] .Start of care with 48 hours post discharge  09/02/2021 at [facility] per MD note: Plan: [name] is 2 weeks status post left total knee replacement on 08/18/2021.  He has 1 more session of home health PT.  Overall no real complaints other than soreness and swelling.   Examination left knee shows healed surgical incision with intact sutures.  No drainage or signs of infection.  Expected moderate swelling.  Range  of motion is acceptable.   Sutures removed Steri-Strips applied.  Dental prophylaxis reinforced.  Implant card provided.  Outpatient physical therapy referral made.  Tramadol prescribed.  He will also continue take over-the-counter medications.  Follow-up in 4 weeks with two-view x-rays of the left knee.   TODAY's VISIT: Knee pain increasing since visit with Orthopedics. Reports pain medication prescribed from Orthopedics is not helping. Also, reports has been cooler weather this week which is making worse. Reports steri strips still in place. Ambulating with front-wheel walker. Next appointment with Orthopedics 09/30/2021. Has physical therapy three times weekly, goes to their office for this. Plans to get dentures after knee heels.   Doing well on current hypertension regimen. No issues/concerns. Not checking blood pressures outside of office.   Doing well on current diabetes regimen. No issues/concerns.   Patient/Caregiver self-reported problems/concerns: see above  MEDICATIONS  Medication Reconciliation conducted with patient/caregiver? (Yes/ No): Yes  New medications prescribed/discontinued upon discharge? (Yes/No): Yes  Barriers identified related to medications: No  LABS  Lab Reviewed (Yes/No/NA): Yes  PHYSICAL EXAM:  Physical Exam HENT:     Head: Normocephalic and atraumatic.  Eyes:     Extraocular Movements: Extraocular movements intact.     Conjunctiva/sclera: Conjunctivae normal.     Pupils: Pupils are equal, round, and reactive to light.  Cardiovascular:     Rate and Rhythm: Normal rate and regular rhythm.     Pulses: Normal  pulses.     Heart sounds: Normal heart sounds.  Pulmonary:     Effort: Pulmonary effort is normal.     Breath sounds: Normal breath sounds.  Musculoskeletal:     Cervical back: Normal range of motion and neck supple.  Neurological:     General: No focal deficit present.     Mental Status: He is alert and oriented to person, place, and time.   Psychiatric:        Mood and Affect: Mood normal.        Behavior: Behavior normal.   Results for orders placed or performed in visit on 09/12/21  POCT glycosylated hemoglobin (Hb A1C)  Result Value Ref Range   Hemoglobin A1C 7.1 (A) 4.0 - 5.6 %   HbA1c POC (<> result, manual entry)     HbA1c, POC (prediabetic range)     HbA1c, POC (controlled diabetic range)      ASSESSMENT AND PLAN: 1. Hospital discharge follow-up: - Reviewed hospital course, current medications, ensured proper follow-up in place, and addressed concerns.   2. Primary osteoarthritis of left knee: - Keep scheduled appointment 09/30/2021 with Marcus Mussel, MD located at [facility].  3. Essential (primary) hypertension: - Continue Amlodipine and Lisinopril as prescribed.  - Counseled on blood pressure goal of less than 130/80, low-sodium, DASH diet, medication compliance, and 150 minutes of moderate intensity exercise per week as tolerated. Counseled on medication adherence and adverse effects. - Follow-up with primary provider in 4 months or sooner if needed.  - amLODipine (NORVASC) 5 MG tablet; Take 1 tablet (5 mg total) by mouth daily.  Dispense: 120 tablet; Refill: 0 - lisinopril (ZESTRIL) 10 MG tablet; Take 1 tablet (10 mg total) by mouth daily.  Dispense: 120 tablet; Refill: 0  4. Type 2 diabetes mellitus without complication, without long-term current use of insulin (HCC): - Hemoglobin A1c relative to goal today at 7.1%, goal 7%.  - Continue Metformin and Dapagliflozin Propanediol as prescribed.  - Discussed the importance of healthy eating habits, low-carbohydrate diet, low-sugar diet, regular aerobic exercise (at least 150 minutes a week as tolerated) and medication compliance to achieve or maintain control of diabetes. - Follow-up with primary provider in 4 months or sooner if needed.  - POCT glycosylated hemoglobin (Hb A1C) - metFORMIN (GLUCOPHAGE) 1000 MG tablet; Take 1 tablet (1,000 mg total)  by mouth 2 (two) times daily with a meal.  Dispense: 240 tablet; Refill: 0 - dapagliflozin propanediol (FARXIGA) 5 MG TABS tablet; Take 1 tablet (5 mg total) by mouth daily before breakfast.  Dispense: 120 tablet; Refill: 0   PATIENT EDUCATION PROVIDED: See AVS   FOLLOW-UP (Include any further testing or referrals):  - Keep all scheduled appointments with Orthopedics.  - Keep all scheduled appointments with Physical Therapy.  - Follow-up with primary provider in 4 months or sooner if needed.

## 2021-09-04 NOTE — Telephone Encounter (Signed)
Patient called and reviewed instructions that he should go to his pharmacy and pick up 81 mg Aspirin that was prescribed prior to surgery and take one tablet twice daily with food. He verbalized understanding of instructions.  ?

## 2021-09-09 ENCOUNTER — Encounter: Payer: Self-pay | Admitting: Rehabilitative and Restorative Service Providers"

## 2021-09-09 ENCOUNTER — Ambulatory Visit (INDEPENDENT_AMBULATORY_CARE_PROVIDER_SITE_OTHER): Payer: 59 | Admitting: Rehabilitative and Restorative Service Providers"

## 2021-09-09 DIAGNOSIS — M6281 Muscle weakness (generalized): Secondary | ICD-10-CM

## 2021-09-09 DIAGNOSIS — M25662 Stiffness of left knee, not elsewhere classified: Secondary | ICD-10-CM | POA: Diagnosis not present

## 2021-09-09 DIAGNOSIS — R262 Difficulty in walking, not elsewhere classified: Secondary | ICD-10-CM

## 2021-09-09 DIAGNOSIS — R600 Localized edema: Secondary | ICD-10-CM

## 2021-09-09 DIAGNOSIS — M25562 Pain in left knee: Secondary | ICD-10-CM

## 2021-09-09 NOTE — Therapy (Signed)
?OUTPATIENT PHYSICAL THERAPY LOWER EXTREMITY EVALUATION ? ? ?Patient Name: Marcus [name] ?MRN: [id number] ?DOB:[birth year], 61 y.o., male ?Today's Date: 09/09/2021 ? ? PT End of Session - 09/09/21 0855   ? ? Visit Number 1   ? Number of Visits 15   ? PT Start Time 0845   ? PT Stop Time 0930   ? PT Time Calculation (min) 45 min   ? Activity Tolerance Patient tolerated treatment well   ? Behavior During Therapy [facility] for tasks assessed/performed   ? ?  ?  ? ?  ? ? ?Past Medical History:  ?Diagnosis Date  ? Diabetes mellitus without complication (HCC)   ? Hypertension   ? ?Past Surgical History:  ?Procedure Laterality Date  ? ankle surgery Left 1980  ? TOTAL KNEE ARTHROPLASTY Left 08/18/2021  ? Procedure: LEFT TOTAL KNEE ARTHROPLASTY;  Surgeon: Tarry Kos, MD;  Location: [facility] OR;  Service: Orthopedics;  Laterality: Left;  ? ?Patient Active Problem List  ? Diagnosis Date Noted  ? Primary osteoarthritis of left knee 08/18/2021  ? Status post total left knee replacement 08/18/2021  ? ? ?PCP: Rema Fendt, NP ? ?REFERRING PROVIDER: Tarry Kos, MD ? ?REFERRING DIAG: G64.403 (ICD-10-CM) - Status post total left knee replacement  ? ?THERAPY DIAG:  ?Difficulty in walking, not elsewhere classified ? ?Muscle weakness (generalized) ? ?Localized edema ? ?Stiffness of left knee, not elsewhere classified ? ?Left knee pain, unspecified chronicity ? ?ONSET DATE: August 18, 2021 ? ?SUBJECTIVE:  ? ?SUBJECTIVE STATEMENT: ?Marcus [name] is sleeping about 3-4 hours uninterrupted.   ? ?PERTINENT HISTORY: ?DM, HTN, smoker ? ?PAIN:  ?Are you having pain? Yes: NPRS scale: 4-8/10 ?Pain location: L knee ?Pain description: Ache, stiff, sore ?Aggravating factors: Hurts all the time ?Relieving factors: Some relief with CPM, ice, tylenol ? ?PRECAUTIONS: Knee ? ?WEIGHT BEARING RESTRICTIONS No ? ?FALLS:  ?Has patient fallen in last 6 months? No ? ?LIVING ENVIRONMENT: ?Lives with: lives alone ?Lives in: House/apartment ?Stairs:  Has trouble with stairs ?Has  following equipment at home: Dan Humphreys - 2 wheeled ? ?OCCUPATION: Works 3rd shift at [employer] ? ?PLOF: Independent ? ?PATIENT GOALS Get back to normal activities without pain ? ? ?OBJECTIVE:  ? ?DIAGNOSTIC FINDINGS: FINDINGS: ?There are postsurgical changes from left total knee arthroplasty. ?Hardware components are in anatomic alignment. Gas is identified ?within the suprapatellar joint space and the soft tissues along the ?anterior surface of the knee. ?  ?IMPRESSION: ?Status post left total knee arthroplasty. ? ?PATIENT SURVEYS:  ?FOTO 41 (Goal 63 in 15 visits) ? ?COGNITION: ? Overall cognitive status: Within functional limits for tasks assessed   ?  ?SENSATION: ?Meko notes no paresthesias or numbness ? ?LE ROM: ? ?Active ROM Right ?09/09/2021 Left ?09/09/2021  ?Hip flexion    ?Hip extension    ?Hip abduction    ?Hip adduction    ?Hip internal rotation    ?Hip external rotation    ?Knee flexion 138 95  ?Knee extension 0 -20  ?Ankle dorsiflexion    ?Ankle plantarflexion    ?Ankle inversion    ?Ankle eversion    ? (Blank rows = not tested) ? ?LE MMT: ? ?MMT Right ?09/09/2021 Left ?09/09/2021  ?Hip flexion    ?Hip extension    ?Hip abduction    ?Hip adduction    ?Hip internal rotation    ?Hip external rotation    ?Knee flexion    ?Knee extension 4/5 2/5  ?Ankle dorsiflexion    ?Ankle plantarflexion    ?  Ankle inversion    ?Ankle eversion    ? (Blank rows = not tested) ? ?GAIT: ?Comments: [name] is using a wheeled walker and is out of work as he recovers from his TKA. ? ? ? ?TODAY'S TREATMENT: ?Tailgate knee flexion 3 minutes ?AAROM knee flexion with self-overpressure 5X 10 seconds ?Quadriceps sets with heel prop 10X 5 seconds ?Seated knee extension stretch 3 minutes ? ?Vaso Medium L knee Medium Pressure 34* 5 minutes ? ? ?PATIENT EDUCATION:  ?Education details: Reviewed HEP ?Person educated: Patient ?Education method: Explanation, Demonstration, and Tactile cues, Home exercise program ?Education comprehension:  verbalized understanding, returned demonstration, verbal cues required, tactile cues required, and needs further education ? ? ?HOME EXERCISE PROGRAM: ?Access Code: [email or web address] ?URL: [email or web address] ?Date: 09/09/2021 ?Prepared by: Pauletta Browns ? ?Exercises ?- Supine Quadricep Sets  - 3-5 x daily - 7 x weekly - 2-3 sets - 10 reps - 5 second hold ?- Seated Knee Flexion AAROM  - 3-5 x daily - 7 x weekly - 1 sets - 1 reps - 3 minutes hold ?- Seated Knee Extension Stretch with Chair  - 3-5 x daily - 7 x weekly - 1 sets - 1 reps - 3-5 minutes hold ? ?ASSESSMENT: ? ?CLINICAL IMPRESSION: ?Patient is a 61 y.o. male who was seen today for physical therapy evaluation and treatment for s/p L TKA.  Knee extension AROM will be priority #1 with flexion, edema control and quadriceps strength also being addressed.   ? ? ?OBJECTIVE IMPAIRMENTS Abnormal gait, decreased activity tolerance, decreased balance, decreased endurance, decreased knowledge of condition, decreased mobility, difficulty walking, decreased ROM, decreased strength, decreased safety awareness, increased edema, impaired perceived functional ability, and pain.  ? ?ACTIVITY LIMITATIONS occupation and ADLs .  ? ?PERSONAL FACTORS Education, Fitness, and Transportation are also affecting patient's functional outcome.  ? ? ?REHAB POTENTIAL: Good ? ?CLINICAL DECISION MAKING: Stable/uncomplicated ? ?EVALUATION COMPLEXITY: Low ? ? ?GOALS: ?Goals reviewed with patient? Yes ? ?SHORT TERM GOALS: Target date: 10/07/2021  (Remove Blue Hyperlink) ? ?Improve L knee extension AROM to -10 or better ?Baseline: -20 degrees ?Goal status: INITIAL ? ?2.  Marcus [name] will be independent with his day 1 HEP. ?Baseline: Started 09/09/2021 ?Goal status: INITIAL ? ?3.  Marcus [name] will be comfortable using a cane with gait. ?Baseline: Wheeled walker ?Goal status: INITIAL ? ?LONG TERM GOALS: Target date: 11/04/2021  (Remove Blue Hyperlink) ? ?Improve FOTO to 63. ?Baseline: 41 ?Goal status:  INITIAL ? ?2.  Improve L knee pain to consistently 0-3/10 on the Numeric Pain Rating Scale. ?Baseline: 4-8/10 ?Goal status: INITIAL ? ?3.  Improve L knee AROM to -3 to 110 degrees or better. ?Baseline: -20 to 95 degrees. ?Goal status: INITIAL ? ?4.  Improve B quadriceps strength as assessed by gait without an AD, objective measures and functional scores. ?Baseline: Walker and see FOTO above ?Goal status: INITIAL ? ?5.  Loranzo will be independent with his HEP at DC. ?Baseline: Started 09/09/2021 ?Goal status: INITIAL ? ?PLAN: ?PT FREQUENCY:  2-3x/week ? ?PT DURATION: 8 weeks ? ?PLANNED INTERVENTIONS: Therapeutic exercises, Therapeutic activity, Neuromuscular re-education, Balance training, Gait training, Patient/Family education, Joint mobilization, Stair training, Cryotherapy, Vasopneumatic device, Manual therapy, and Re-evaluation ? ?PLAN FOR NEXT SESSION: Review day 1 HEP.  Focus on extension AROM and strength. ? ? ?Cherlyn Cushing, PT, MPT ?09/09/2021, 4:43 PM  ?

## 2021-09-11 ENCOUNTER — Ambulatory Visit (INDEPENDENT_AMBULATORY_CARE_PROVIDER_SITE_OTHER): Payer: 59 | Admitting: Physical Therapy

## 2021-09-11 ENCOUNTER — Encounter: Payer: Self-pay | Admitting: Physical Therapy

## 2021-09-11 DIAGNOSIS — M25662 Stiffness of left knee, not elsewhere classified: Secondary | ICD-10-CM | POA: Diagnosis not present

## 2021-09-11 DIAGNOSIS — R600 Localized edema: Secondary | ICD-10-CM

## 2021-09-11 DIAGNOSIS — R262 Difficulty in walking, not elsewhere classified: Secondary | ICD-10-CM

## 2021-09-11 DIAGNOSIS — M25562 Pain in left knee: Secondary | ICD-10-CM

## 2021-09-11 DIAGNOSIS — M6281 Muscle weakness (generalized): Secondary | ICD-10-CM

## 2021-09-11 NOTE — Therapy (Signed)
OUTPATIENT PHYSICAL THERAPY TREATMENT NOTE   Patient Name: Marcus [name] MRN: [id number] DOB:[birth year], 61 y.o., male 66 Date: 09/11/2021  PCP: Camillia Herter, NP REFERRING PROVIDER: Leandrew Koyanagi, MD  END OF SESSION:   PT End of Session - 09/11/21 0848     Visit Number 2    Number of Visits 15    Authorization Type [employer]    Authorization Time Period $10 co-pay  30 visit limit PT/OT/chiro    PT Start Time 0845    PT Stop Time 0935    PT Time Calculation (min) 50 min    Activity Tolerance Patient tolerated treatment well    Behavior During Therapy [facility] for tasks assessed/performed             Past Medical History:  Diagnosis Date   Diabetes mellitus without complication ([place])    Hypertension    Past Surgical History:  Procedure Laterality Date   ankle surgery Left 1980   TOTAL KNEE ARTHROPLASTY Left 08/18/2021   Procedure: LEFT TOTAL KNEE ARTHROPLASTY;  Surgeon: Leandrew Koyanagi, MD;  Location: [place];  Service: Orthopedics;  Laterality: Left;   Patient Active Problem List   Diagnosis Date Noted   Primary osteoarthritis of left knee 08/18/2021   Status post total left knee replacement 08/18/2021    REFERRING DIAG: [id number] (ICD-10-CM) - Status post total left knee replacement  ONSET DATE: August 18, 2021  THERAPY DIAG:  Difficulty in walking, not elsewhere classified  Muscle weakness (generalized)  Localized edema  Stiffness of left knee, not elsewhere classified  Left knee pain, unspecified chronicity  PERTINENT HISTORY: DM, HTN, smoker  PRECAUTIONS: Knee  SUBJECTIVE: His exercises are going well.  He is elevating in chair.  PAIN:  Are you having pain? Yes: NPRS scale: this morning 8/10 and in last week lowest 3/10 and highest 8/10 Pain location: left knee anterior more medially & posteriorly  Pain description: burning Aggravating factors: standing too much & bending knee exp after stiffening Relieving factors: tylenol,  ice   OBJECTIVE: (objective measures completed at initial evaluation unless otherwise dated)  OBJECTIVE:    DIAGNOSTIC FINDINGS: FINDINGS: 06/10/2021: There are postsurgical changes from left total knee arthroplasty. Hardware components are in anatomic alignment. Gas is identified within the suprapatellar joint space and the soft tissues along the anterior surface of the knee.   PATIENT SURVEYS:  FOTO 09/09/2021:  41 (Goal 63 in 15 visits)     SENSATION: 09/09/2021:  [name] notes no paresthesias or numbness   LE ROM:   ROM P:passive  A:active Right 09/09/2021 Left 09/09/2021  Hip flexion      Hip extension      Hip abduction      Hip adduction      Hip internal rotation      Hip external rotation      Knee flexion A: 138 A: 95  Knee extension A: 0 A: -20  Ankle dorsiflexion      Ankle plantarflexion      Ankle inversion      Ankle eversion       (Blank rows = not tested)   LE MMT:   MMT Right 09/09/2021 Left 09/09/2021  Hip flexion      Hip extension      Hip abduction      Hip adduction      Hip internal rotation      Hip external rotation      Knee flexion  Knee extension 4/5 2/5  Ankle dorsiflexion      Ankle plantarflexion      Ankle inversion      Ankle eversion       (Blank rows = not tested)   GAIT: Comments: [name] is using a wheeled walker and is out of work as he recovers from his TKA.      TODAY'S TREATMENT: 09/11/2021: Therapeutic Exercise:  Aerobic: Nustep seat 7 level 5 ising BLEs & BUEs for 8 min.  Supine: quad set ankle on towel roll 5 sec hold 10 reps 2 sets.  SLR small range 10 reps 2 sets  Heel slide with foot on ball with strap assist 15 reps 5 sec hold flex & ext Prone:  Seated: heel slides foot on pillow case 5sec hold flex & ext 15 reps.   LAQ no wt 15 reps 2 sets 5 sec hold slow eccentric  Standing: Neuromuscular Re-education: Manual Therapy: PROM left knee with mild distraction & overpressure for both flexion & extension.   Therapeutic Activity:  sit to/from stand using chair bottom 15 reps.  PT cued technique for proper knee motion & LE use.  Self Care:  PT educated on elevation with heart higher than LE for >/= 15 min >/= 2x/day. Pt verbalized understanding.  Trigger Point Dry Needling:  Modalities: Vaso left knee medium compression 34* 10 min with elevation    09/09/2021:  Tailgate knee flexion 3 minutes AAROM knee flexion with self-overpressure 5X 10 seconds Quadriceps sets with heel prop 10X 5 seconds Seated knee extension stretch 3 minutes   Vaso Medium L knee Medium Pressure 34* 5 minutes     PATIENT EDUCATION:  Education details: Reviewed HEP Person educated: Patient Education method: Explanation, Demonstration, and Tactile cues, Home exercise program Education comprehension: verbalized understanding, returned demonstration, verbal cues required, tactile cues required, and needs further education     HOME EXERCISE PROGRAM: Access Code: [email or web address] URL: [email or web address] Date: 09/09/2021 Prepared by: Vista Mink   Exercises - Supine Quadricep Sets  - 3-5 x daily - 7 x weekly - 2-3 sets - 10 reps - 5 second hold - Seated Knee Flexion AAROM  - 3-5 x daily - 7 x weekly - 1 sets - 1 reps - 3 minutes hold - Seated Knee Extension Stretch with Chair  - 3-5 x daily - 7 x weekly - 1 sets - 1 reps - 3-5 minutes hold   ASSESSMENT:  CLINICAL IMPRESSION: Patient improved range of knee with repetition & instruction. He is limited by pain.  He seems to understand HEP & elevation better.  He benefits from PT to improve function following his TKR.     OBJECTIVE IMPAIRMENTS Abnormal gait, decreased activity tolerance, decreased balance, decreased endurance, decreased knowledge of condition, decreased mobility, difficulty walking, decreased ROM, decreased strength, decreased safety awareness, increased edema, impaired perceived functional ability, and pain.    ACTIVITY LIMITATIONS occupation  and ADLs .    PERSONAL FACTORS Education, Fitness, and Transportation are also affecting patient's functional outcome.      REHAB POTENTIAL: Good   CLINICAL DECISION MAKING: Stable/uncomplicated   EVALUATION COMPLEXITY: Low     GOALS: Goals reviewed with patient? Yes   SHORT TERM GOALS: Target date: 10/07/2021    Improve L knee extension AROM to -10 or better Baseline: -20 degrees Goal status: INITIAL   2.  Shahan will be independent with his day 1 HEP. Baseline: Started 09/09/2021 Goal status: INITIAL   3.  Mahonri will be comfortable using  a cane with gait. Baseline: Wheeled walker Goal status: INITIAL   LONG TERM GOALS: Target date: 11/04/2021     Improve FOTO to 63. Baseline: 41 Goal status: INITIAL   2.  Improve L knee pain to consistently 0-3/10 on the Numeric Pain Rating Scale. Baseline: 4-8/10 Goal status: INITIAL   3.  Improve L knee AROM to -3 to 110 degrees or better. Baseline: -20 to 95 degrees. Goal status: INITIAL   4.  Improve B quadriceps strength as assessed by gait without an AD, objective measures and functional scores. Baseline: Walker and see FOTO above Goal status: INITIAL   5.  Semisi will be independent with his HEP at DC. Baseline: Started 09/09/2021 Goal status: INITIAL   PLAN: PT FREQUENCY:  2-3x/week   PT DURATION: 8 weeks   PLANNED INTERVENTIONS: Therapeutic exercises, Therapeutic activity, Neuromuscular re-education, Balance training, Gait training, Patient/Family education, Joint mobilization, Stair training, Cryotherapy, Vasopneumatic device, Manual therapy, and Re-evaluation   PLAN FOR NEXT SESSION: try recumbent bike, manual therapy & therapeutic exer working on range & strength. Add basic standing balance.  Vaso to end.     [name], PT, DPT 09/11/2021, 9:28 AM

## 2021-09-12 ENCOUNTER — Ambulatory Visit (INDEPENDENT_AMBULATORY_CARE_PROVIDER_SITE_OTHER): Payer: 59 | Admitting: Family

## 2021-09-12 VITALS — BP 137/78 | HR 68 | Temp 98.0°F | Resp 18 | Ht 64.02 in | Wt 132.0 lb

## 2021-09-12 DIAGNOSIS — E119 Type 2 diabetes mellitus without complications: Secondary | ICD-10-CM

## 2021-09-12 DIAGNOSIS — M1712 Unilateral primary osteoarthritis, left knee: Secondary | ICD-10-CM | POA: Diagnosis not present

## 2021-09-12 DIAGNOSIS — Z09 Encounter for follow-up examination after completed treatment for conditions other than malignant neoplasm: Secondary | ICD-10-CM

## 2021-09-12 DIAGNOSIS — I10 Essential (primary) hypertension: Secondary | ICD-10-CM

## 2021-09-12 LAB — POCT GLYCOSYLATED HEMOGLOBIN (HGB A1C): Hemoglobin A1C: 7.1 % — AB (ref 4.0–5.6)

## 2021-09-12 MED ORDER — DAPAGLIFLOZIN PROPANEDIOL 5 MG PO TABS: 5.0000 mg | ORAL_TABLET | Freq: Every day | ORAL | 0 refills | Status: DC

## 2021-09-12 MED ORDER — LISINOPRIL 10 MG PO TABS: 10.0000 mg | ORAL_TABLET | Freq: Every day | ORAL | 0 refills | Status: DC

## 2021-09-12 MED ORDER — AMLODIPINE BESYLATE 5 MG PO TABS: 5.0000 mg | ORAL_TABLET | Freq: Every day | ORAL | 0 refills | Status: DC

## 2021-09-12 MED ORDER — METFORMIN HCL 1000 MG PO TABS: 1000.0000 mg | ORAL_TABLET | Freq: Two times a day (BID) | ORAL | 0 refills | Status: DC

## 2021-09-12 NOTE — Progress Notes (Signed)
Pt presents for hospital f/u after left knee replacement

## 2021-09-12 NOTE — Progress Notes (Signed)
Diabetes close to goal. Continue present management.

## 2021-09-15 ENCOUNTER — Encounter: Payer: 59 | Admitting: Physical Therapy

## 2021-09-15 NOTE — Therapy (Incomplete)
OUTPATIENT PHYSICAL THERAPY TREATMENT NOTE   Patient Name: Marcus [name] MRN: [id number] DOB:Nov 18, 1960, 61 y.o., male 19 Date: 09/15/2021  PCP: Camillia Herter, NP REFERRING PROVIDER: Leandrew Koyanagi, MD  END OF SESSION:     Past Medical History:  Diagnosis Date   Diabetes mellitus without complication ([place])    Hypertension    Past Surgical History:  Procedure Laterality Date   ankle surgery Left 1980   TOTAL KNEE ARTHROPLASTY Left 08/18/2021   Procedure: LEFT TOTAL KNEE ARTHROPLASTY;  Surgeon: Leandrew Koyanagi, MD;  Location: [place];  Service: Orthopedics;  Laterality: Left;   Patient Active Problem List   Diagnosis Date Noted   Primary osteoarthritis of left knee 08/18/2021   Status post total left knee replacement 08/18/2021    REFERRING DIAG: [id number] (ICD-10-CM) - Status post total left knee replacement  ONSET DATE: August 18, 2021  THERAPY DIAG:  No diagnosis found.  PERTINENT HISTORY: DM, HTN, smoker  PRECAUTIONS: Knee  SUBJECTIVE: ***  PAIN:  Are you having pain? *** Yes: NPRS scale: this morning 8/10 and in last week lowest 3/10 and highest 8/10 Pain location: left knee anterior more medially & posteriorly  Pain description: burning Aggravating factors: standing too much & bending knee exp after stiffening Relieving factors: tylenol, ice   OBJECTIVE: (objective measures completed at initial evaluation unless otherwise dated)  OBJECTIVE:    DIAGNOSTIC FINDINGS: FINDINGS: 06/10/2021: There are postsurgical changes from left total knee arthroplasty. Hardware components are in anatomic alignment. Gas is identified within the suprapatellar joint space and the soft tissues along the anterior surface of the knee.   PATIENT SURVEYS:  FOTO 09/09/2021:  41 (Goal 63 in 15 visits)     SENSATION: 09/09/2021:  [name] notes no paresthesias or numbness   LE ROM:   ROM P:passive  A:active Right 09/09/2021 Left 09/09/2021  Hip flexion      Hip extension      Hip  abduction      Hip adduction      Hip internal rotation      Hip external rotation      Knee flexion A: 138 A: 95  Knee extension A: 0 A: -20  Ankle dorsiflexion      Ankle plantarflexion      Ankle inversion      Ankle eversion       (Blank rows = not tested)   LE MMT:   MMT Right 09/09/2021 Left 09/09/2021  Hip flexion      Hip extension      Hip abduction      Hip adduction      Hip internal rotation      Hip external rotation      Knee flexion      Knee extension 4/5 2/5  Ankle dorsiflexion      Ankle plantarflexion      Ankle inversion      Ankle eversion       (Blank rows = not tested)   GAIT: Comments: Laquon is using a wheeled walker and is out of work as he recovers from his TKA.      TODAY'S TREATMENT: 09/15/2021 ***  09/11/2021: Therapeutic Exercise:  Aerobic: Nustep seat 7 level 5 ising BLEs & BUEs for 8 min.  Supine: quad set ankle on towel roll 5 sec hold 10 reps 2 sets.  SLR small range 10 reps 2 sets  Heel slide with foot on ball with strap assist 15 reps 5 sec hold flex &  ext Prone:  Seated: heel slides foot on pillow case 5sec hold flex & ext 15 reps.   LAQ no wt 15 reps 2 sets 5 sec hold slow eccentric  Standing: Neuromuscular Re-education: Manual Therapy: PROM left knee with mild distraction & overpressure for both flexion & extension.  Therapeutic Activity:  sit to/from stand using chair bottom 15 reps.  PT cued technique for proper knee motion & LE use.  Self Care:  PT educated on elevation with heart higher than LE for >/= 15 min >/= 2x/day. Pt verbalized understanding.  Trigger Point Dry Needling:  Modalities: Vaso left knee medium compression 34* 10 min with elevation    09/09/2021:  Tailgate knee flexion 3 minutes AAROM knee flexion with self-overpressure 5X 10 seconds Quadriceps sets with heel prop 10X 5 seconds Seated knee extension stretch 3 minutes   Vaso Medium L knee Medium Pressure 34* 5 minutes     PATIENT EDUCATION:   Education details: Reviewed HEP Person educated: Patient Education method: Explanation, Demonstration, and Tactile cues, Home exercise program Education comprehension: verbalized understanding, returned demonstration, verbal cues required, tactile cues required, and needs further education     HOME EXERCISE PROGRAM: Access Code: [email or web address] URL: [email or web address] Date: 09/09/2021 Prepared by: Vista Mink   Exercises - Supine Quadricep Sets  - 3-5 x daily - 7 x weekly - 2-3 sets - 10 reps - 5 second hold - Seated Knee Flexion AAROM  - 3-5 x daily - 7 x weekly - 1 sets - 1 reps - 3 minutes hold - Seated Knee Extension Stretch with Chair  - 3-5 x daily - 7 x weekly - 1 sets - 1 reps - 3-5 minutes hold   ASSESSMENT:  CLINICAL IMPRESSION: ***   OBJECTIVE IMPAIRMENTS Abnormal gait, decreased activity tolerance, decreased balance, decreased endurance, decreased knowledge of condition, decreased mobility, difficulty walking, decreased ROM, decreased strength, decreased safety awareness, increased edema, impaired perceived functional ability, and pain.    ACTIVITY LIMITATIONS occupation and ADLs .    PERSONAL FACTORS Education, Fitness, and Transportation are also affecting patient's functional outcome.      REHAB POTENTIAL: Good   CLINICAL DECISION MAKING: Stable/uncomplicated   EVALUATION COMPLEXITY: Low     GOALS: Goals reviewed with patient? Yes   SHORT TERM GOALS: Target date: 10/07/2021    Improve L knee extension AROM to -10 or better Baseline: -20 degrees Goal status: INITIAL   2.  Raymone will be independent with his day 1 HEP. Baseline: Started 09/09/2021 Goal status: INITIAL   3.  Maxemiliano will be comfortable using a cane with gait. Baseline: Wheeled walker Goal status: INITIAL   LONG TERM GOALS: Target date: 11/04/2021     Improve FOTO to 63. Baseline: 41 Goal status: INITIAL   2.  Improve L knee pain to consistently 0-3/10 on the Numeric Pain  Rating Scale. Baseline: 4-8/10 Goal status: INITIAL   3.  Improve L knee AROM to -3 to 110 degrees or better. Baseline: -20 to 95 degrees. Goal status: INITIAL   4.  Improve B quadriceps strength as assessed by gait without an AD, objective measures and functional scores. Baseline: Walker and see FOTO above Goal status: INITIAL   5.  Mackay will be independent with his HEP at DC. Baseline: Started 09/09/2021 Goal status: INITIAL   PLAN: PT FREQUENCY:  2-3x/week   PT DURATION: 8 weeks   PLANNED INTERVENTIONS: Therapeutic exercises, Therapeutic activity, Neuromuscular re-education, Balance training, Gait training, Patient/Family education, Joint mobilization, Stair training, Cryotherapy, Vasopneumatic  device, Manual therapy, and Re-evaluation   PLAN FOR NEXT SESSION: *** try recumbent bike, manual therapy & therapeutic exer working on range & strength. Add basic standing balance.  Vaso to end.     [name], PT, DPT 09/15/2021, 8:01 AM

## 2021-09-16 ENCOUNTER — Encounter: Payer: 59 | Admitting: Physical Therapy

## 2021-09-17 ENCOUNTER — Encounter: Payer: Self-pay | Admitting: Physical Therapy

## 2021-09-17 ENCOUNTER — Ambulatory Visit (INDEPENDENT_AMBULATORY_CARE_PROVIDER_SITE_OTHER): Payer: 59 | Admitting: Physical Therapy

## 2021-09-17 DIAGNOSIS — R262 Difficulty in walking, not elsewhere classified: Secondary | ICD-10-CM

## 2021-09-17 DIAGNOSIS — R600 Localized edema: Secondary | ICD-10-CM | POA: Diagnosis not present

## 2021-09-17 DIAGNOSIS — M6281 Muscle weakness (generalized): Secondary | ICD-10-CM

## 2021-09-17 DIAGNOSIS — M25562 Pain in left knee: Secondary | ICD-10-CM

## 2021-09-17 DIAGNOSIS — M25662 Stiffness of left knee, not elsewhere classified: Secondary | ICD-10-CM

## 2021-09-17 NOTE — Therapy (Signed)
OUTPATIENT PHYSICAL THERAPY TREATMENT NOTE   Patient Name: Marcus [name] MRN: [id number] DOB:1960/07/22, 61 y.o., male 64 Date: 09/17/2021  PCP: Rema Fendt, NP REFERRING PROVIDER: Tarry Kos, MD  END OF SESSION:   PT End of Session - 09/17/21 0849     Visit Number 3    Number of Visits 15    Authorization Type [employer]    Authorization Time Period $10 co-pay  30 visit limit PT/OT/chiro    PT Start Time 0845    PT Stop Time 0940    PT Time Calculation (min) 55 min    Activity Tolerance Patient tolerated treatment well    Behavior During Therapy [facility] for tasks assessed/performed              Past Medical History:  Diagnosis Date   Diabetes mellitus without complication ([facility])    Hypertension    Past Surgical History:  Procedure Laterality Date   ankle surgery Left 1980   TOTAL KNEE ARTHROPLASTY Left 08/18/2021   Procedure: LEFT TOTAL KNEE ARTHROPLASTY;  Surgeon: Tarry Kos, MD;  Location: [facility];  Service: Orthopedics;  Laterality: Left;   Patient Active Problem List   Diagnosis Date Noted   Primary osteoarthritis of left knee 08/18/2021   Status post total left knee replacement 08/18/2021    REFERRING DIAG: O11.572 (ICD-10-CM) - Status post total left knee replacement  ONSET DATE: August 18, 2021  THERAPY DIAG:  Difficulty in walking, not elsewhere classified  Muscle weakness (generalized)  Localized edema  Stiffness of left knee, not elsewhere classified  Left knee pain, unspecified chronicity  PERTINENT HISTORY: DM, HTN, smoker  PRECAUTIONS: Knee  SUBJECTIVE: He has been doing his exercises and elevating. He feels that his knee gets stiff.   PAIN:  Are you having pain?  Yes: NPRS scale: this morning 2/10 and in last week lowest 2/10 and highest 8/10 Pain location: left knee anterior more medially & posteriorly  Pain description: burning Aggravating factors: standing too much & bending knee exp after stiffening Relieving  factors: tylenol, ice   OBJECTIVE: (objective measures completed at initial evaluation unless otherwise dated)  OBJECTIVE:    DIAGNOSTIC FINDINGS: FINDINGS: 06/10/2021: There are postsurgical changes from left total knee arthroplasty. Hardware components are in anatomic alignment. Gas is identified within the suprapatellar joint space and the soft tissues along the anterior surface of the knee.   PATIENT SURVEYS:  FOTO 09/09/2021:  41 (Goal 63 in 15 visits)     SENSATION: 09/09/2021:  Gloris Manchester notes no paresthesias or numbness   LE ROM:   ROM P:passive  A:active Right 09/09/21 Left 09/09/21 Left 09/17/21  Hip flexion       Hip extension       Hip abduction       Hip adduction       Hip internal rotation       Hip external rotation       Knee flexion A: 138 A: 95 Seated P: A:   Knee extension A: 0 A: -20 Seated P: A:   Ankle dorsiflexion       Ankle plantarflexion       Ankle inversion       Ankle eversion        (Blank rows = not tested)   LE MMT:   MMT Right 09/09/21 Left 09/09/21  Hip flexion      Hip extension      Hip abduction      Hip adduction  Hip internal rotation      Hip external rotation      Knee flexion      Knee extension 4/5 2/5  Ankle dorsiflexion      Ankle plantarflexion      Ankle inversion      Ankle eversion       (Blank rows = not tested)   GAIT: Comments: Gloris ManchesterVince is using a wheeled walker and is out of work as he recovers from his TKA.      TODAY'S TREATMENT: 09/17/2021 Therapeutic Exercise:  Aerobic: Scifit recumbent bike LEs only seat 9 level 3 for 5 min, then level 1 for 3 min for total 8 min. Machines for strength:  Leg press BLEs 75# 15 reps 2 sets Supine: quad set ankle on towel roll then SLR 15 reps 2 sets  Prone:  Seated: LAQ 3# & active knee flexion with contralateral LE opposing motion 15 reps 2 sets 5 sec hold slow eccentric  Standing: Neuromuscular Re-education: Manual Therapy: PROM left knee with mild distraction &  overpressure for both flexion & extension.  Therapeutic Activity:   Self Care:  PT educated on exercising / moving knee >/= 5 min of each awake hour.  Pt recommended standing & or walking 10 min of 10 hour period during day to prepare for work. PT demo & verbal cues on positioning Lt knee seated & supine with both ext & flexion. Pt verbalized understanding.  Trigger Point Dry Needling:  Modalities: Vaso left knee medium compression 34* 10 min with elevation    09/11/2021: Therapeutic Exercise:  Aerobic: Nustep seat 7 level 5 ising BLEs & BUEs for 8 min.  Supine: quad set ankle on towel roll 5 sec hold 10 reps 2 sets.  SLR small range 10 reps 2 sets  Heel slide with foot on ball with strap assist 15 reps 5 sec hold flex & ext Prone:  Seated: heel slides foot on pillow case 5sec hold flex & ext 15 reps.   LAQ no wt 15 reps 2 sets 5 sec hold slow eccentric  Standing: Neuromuscular Re-education: Manual Therapy: PROM left knee with mild distraction & overpressure for both flexion & extension.  Therapeutic Activity:  sit to/from stand using chair bottom 15 reps.  PT cued technique for proper knee motion & LE use.  Self Care:  PT educated on elevation with heart higher than LE for >/= 15 min >/= 2x/day. Pt verbalized understanding.  Trigger Point Dry Needling:  Modalities: Vaso left knee medium compression 34* 10 min with elevation    09/09/2021:  Tailgate knee flexion 3 minutes AAROM knee flexion with self-overpressure 5X 10 seconds Quadriceps sets with heel prop 10X 5 seconds Seated knee extension stretch 3 minutes   Vaso Medium L knee Medium Pressure 34* 5 minutes     PATIENT EDUCATION:  Education details: Reviewed HEP Person educated: Patient Education method: Explanation, Demonstration, and Tactile cues, Home exercise program Education comprehension: verbalized understanding, returned demonstration, verbal cues required, tactile cues required, and needs further education     HOME  EXERCISE PROGRAM: Access Code: [email or web address] URL: [email or web address] Date: 09/09/2021 Prepared by: Pauletta Brownsobert Lovell   Exercises - Supine Quadricep Sets  - 3-5 x daily - 7 x weekly - 2-3 sets - 10 reps - 5 second hold - Seated Knee Flexion AAROM  - 3-5 x daily - 7 x weekly - 1 sets - 1 reps - 3 minutes hold - Seated Knee Extension Stretch with Chair  - 3-5 x daily -  7 x weekly - 1 sets - 1 reps - 3-5 minutes hold   ASSESSMENT:  CLINICAL IMPRESSION: Patient seems to understand PT recommendations for exercising throughout his awake hours. And work on progressing standing time to enable return to work.  His pain level is improving.  His functional range & mobility are also improving.    OBJECTIVE IMPAIRMENTS Abnormal gait, decreased activity tolerance, decreased balance, decreased endurance, decreased knowledge of condition, decreased mobility, difficulty walking, decreased ROM, decreased strength, decreased safety awareness, increased edema, impaired perceived functional ability, and pain.    ACTIVITY LIMITATIONS occupation and ADLs .    PERSONAL FACTORS Education, Fitness, and Transportation are also affecting patient's functional outcome.      REHAB POTENTIAL: Good   CLINICAL DECISION MAKING: Stable/uncomplicated   EVALUATION COMPLEXITY: Low     GOALS: Goals reviewed with patient? Yes   SHORT TERM GOALS: Target date: 10/07/2021    Improve L knee extension AROM to -10 or better Baseline: -20 degrees Goal status: INITIAL   2.  Jeovanny will be independent with his day 1 HEP. Baseline: Started 09/09/2021 Goal status: INITIAL   3.  Thaddius will be comfortable using a cane with gait. Baseline: Wheeled walker Goal status: INITIAL   LONG TERM GOALS: Target date: 11/04/2021     Improve FOTO to 63. Baseline: 41 Goal status: INITIAL   2.  Improve L knee pain to consistently 0-3/10 on the Numeric Pain Rating Scale. Baseline: 4-8/10 Goal status: INITIAL   3.  Improve L  knee AROM to -3 to 110 degrees or better. Baseline: -20 to 95 degrees. Goal status: INITIAL   4.  Improve B quadriceps strength as assessed by gait without an AD, objective measures and functional scores. Baseline: Walker and see FOTO above Goal status: INITIAL   5.  Kason will be independent with his HEP at DC. Baseline: Started 09/09/2021 Goal status: INITIAL   PLAN: PT FREQUENCY:  2-3x/week   PT DURATION: 8 weeks   PLANNED INTERVENTIONS: Therapeutic exercises, Therapeutic activity, Neuromuscular re-education, Balance training, Gait training, Patient/Family education, Joint mobilization, Stair training, Cryotherapy, Vasopneumatic device, Manual therapy, and Re-evaluation   PLAN FOR NEXT SESSION: continue with recumbent bike, manual therapy & therapeutic exer working on range & strength. Progress standing & functional activities. 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  Vaso to end.     Vladimir Faster, PT, DPT 09/17/2021, 11:07 AM

## 2021-09-24 ENCOUNTER — Encounter: Payer: Self-pay | Admitting: Physical Therapy

## 2021-09-24 ENCOUNTER — Ambulatory Visit (INDEPENDENT_AMBULATORY_CARE_PROVIDER_SITE_OTHER): Payer: 59 | Admitting: Physical Therapy

## 2021-09-24 DIAGNOSIS — R262 Difficulty in walking, not elsewhere classified: Secondary | ICD-10-CM | POA: Diagnosis not present

## 2021-09-24 DIAGNOSIS — R600 Localized edema: Secondary | ICD-10-CM

## 2021-09-24 DIAGNOSIS — M25662 Stiffness of left knee, not elsewhere classified: Secondary | ICD-10-CM | POA: Diagnosis not present

## 2021-09-24 DIAGNOSIS — M6281 Muscle weakness (generalized): Secondary | ICD-10-CM

## 2021-09-24 NOTE — Therapy (Signed)
OUTPATIENT PHYSICAL THERAPY TREATMENT NOTE   Patient Name: Marcus [name] MRN: [id number] DOB:05/04/1960, 61 y.o., male 30Today's Date: 09/24/2021  PCP: Rema FendtAmy J Stephens, NP REFERRING PROVIDER: Tarry KosNaiping M Xu, MD  END OF SESSION:   PT End of Session - 09/24/21 0845     Visit Number 4    Number of Visits 15    Authorization Type [employer]    Authorization Time Period $10 co-pay  30 visit limit PT/OT/chiro    PT Start Time 0845    PT Stop Time 0938    PT Time Calculation (min) 53 min    Activity Tolerance Patient tolerated treatment well    Behavior During Therapy [facility] for tasks assessed/performed               Past Medical History:  Diagnosis Date   Diabetes mellitus without complication (HCC)    Hypertension    Past Surgical History:  Procedure Laterality Date   ankle surgery Left 1980   TOTAL KNEE ARTHROPLASTY Left 08/18/2021   Procedure: LEFT TOTAL KNEE ARTHROPLASTY;  Surgeon: Tarry KosXu, Naiping M, MD;  Location: [facility] OR;  Service: Orthopedics;  Laterality: Left;   Patient Active Problem List   Diagnosis Date Noted   Primary osteoarthritis of left knee 08/18/2021   Status post total left knee replacement 08/18/2021    REFERRING DIAG: W09.811Z96.652 (ICD-10-CM) - Status post total left knee replacement  ONSET DATE: August 18, 2021  THERAPY DIAG:  Difficulty in walking, not elsewhere classified  Muscle weakness (generalized)  Localized edema  Stiffness of left knee, not elsewhere classified  PERTINENT HISTORY: DM, HTN, smoker  PRECAUTIONS: Knee  SUBJECTIVE: He is working on propping LLE as PT recommended. He also standing 10 min per hour to work on return to work which fatigues his leg more towards end of day.   PAIN:  Are you having pain?  Yes: NPRS scale: this morning 2/10 and in last week lowest 0/10 and highest 6/10 Pain location: left knee anterior more medially & posteriorly  Pain description: burning Aggravating factors: standing too much & bending knee  exp after stiffening Relieving factors: tylenol, ice   OBJECTIVE: (objective measures completed at initial evaluation unless otherwise dated)  OBJECTIVE:    DIAGNOSTIC FINDINGS: FINDINGS: 06/10/2021: There are postsurgical changes from left total knee arthroplasty. Hardware components are in anatomic alignment. Gas is identified within the suprapatellar joint space and the soft tissues along the anterior surface of the knee.   PATIENT SURVEYS:  FOTO 09/09/2021:  41 (Goal 63 in 15 visits)     SENSATION: 09/09/2021:  Marcus [name] notes no paresthesias or numbness   LE ROM:   ROM P:passive  A:active Right 09/09/21 Left 09/09/21 Left 09/17/21 Left 09/24/21  Hip flexion        Hip extension        Hip abduction        Hip adduction        Hip internal rotation        Hip external rotation        Knee flexion A: 138 A: 95 Seated P: 98 A: 97 Seated P: 99 A: 98  Knee extension A: 0 A: -20 Seated P: -9 A: -14 Seated P: -7 A: -11  Ankle dorsiflexion        Ankle plantarflexion        Ankle inversion        Ankle eversion         (Blank rows = not tested)  LE MMT:   MMT Right 09/09/21 Left 09/09/21  Hip flexion      Hip extension      Hip abduction      Hip adduction      Hip internal rotation      Hip external rotation      Knee flexion      Knee extension 4/5 2/5  Ankle dorsiflexion      Ankle plantarflexion      Ankle inversion      Ankle eversion       (Blank rows = not tested)   GAIT: Comments: Marcus [name] is using a wheeled walker and is out of work as he recovers from his TKA.      TODAY'S TREATMENT: 09/24/2021 Therapeutic Exercise:  Aerobic: Scifit recumbent bike LEs only seat 9 level 2 for 8 min. Machines for strength:  Leg press BLEs 87# 15 reps 2 sets 5 sec hold ext & flex with focus on range and LLE only 37# 15 reps Supine:  Prone:  Seated: LAQ 4# & active knee flexion with contralateral LE opposing motion 15 reps 2 sets 5 sec hold slow eccentric   Hamstring  curl green theraband controlled eccentric 15 reps.  Standing: heel raises on incline board 15 reps.    Gastroc stretch on incline board 30 sec hold 2 reps. Neuromuscular Re-education: Manual Therapy: PROM left knee with mild distraction & overpressure for both flexion & extension.  Therapeutic Activity:  PT demo & verbal cues on cane location, knee ext in stance & knee flexion in swing.  Pt return demo with additional verbal cues.  Self Care:  PT reviewed recommendation for standing & or walking 10 min of 10 hour period during day to prepare for work. Pt verbalized understanding.  Trigger Point Dry Needling:  Modalities: Vaso left knee medium compression 34* 10 min with elevation   09/17/2021 Therapeutic Exercise:  Aerobic: Scifit recumbent bike LEs only seat 9 level 3 for 5 min, then level 1 for 3 min for total 8 min. Machines for strength:  Leg press BLEs 75# 15 reps 2 sets Supine: quad set ankle on towel roll then SLR 15 reps 2 sets  Prone:  Seated: LAQ 3# & active knee flexion with contralateral LE opposing motion 15 reps 2 sets 5 sec hold slow eccentric  Standing: Neuromuscular Re-education: Manual Therapy: PROM left knee with mild distraction & overpressure for both flexion & extension.  Therapeutic Activity:   Self Care:  PT educated on exercising / moving knee >/= 5 min of each awake hour.  Pt recommended standing & or walking 10 min of 10 hour period during day to prepare for work. PT demo & verbal cues on positioning Lt knee seated & supine with both ext & flexion. Pt verbalized understanding.  Trigger Point Dry Needling:  Modalities: Vaso left knee medium compression 34* 10 min with elevation    09/11/2021: Therapeutic Exercise:  Aerobic: Nustep seat 7 level 5 ising BLEs & BUEs for 8 min.  Supine: quad set ankle on towel roll 5 sec hold 10 reps 2 sets.  SLR small range 10 reps 2 sets  Heel slide with foot on ball with strap assist 15 reps 5 sec hold flex &  ext Prone:  Seated: heel slides foot on pillow case 5sec hold flex & ext 15 reps.   LAQ no wt 15 reps 2 sets 5 sec hold slow eccentric  Standing: Neuromuscular Re-education: Manual Therapy: PROM left knee with mild distraction &  overpressure for both flexion & extension.  Therapeutic Activity:  sit to/from stand using chair bottom 15 reps.  PT cued technique for proper knee motion & LE use.  Self Care:  PT educated on elevation with heart higher than LE for >/= 15 min >/= 2x/day. Pt verbalized understanding.  Trigger Point Dry Needling:  Modalities: Vaso left knee medium compression 34* 10 min with elevation       HOME EXERCISE PROGRAM: Access Code: [email or web address] URL: [email or web address] Date: 09/09/2021 Prepared by: Pauletta Browns   Exercises - Supine Quadricep Sets  - 3-5 x daily - 7 x weekly - 2-3 sets - 10 reps - 5 second hold - Seated Knee Flexion AAROM  - 3-5 x daily - 7 x weekly - 1 sets - 1 reps - 3 minutes hold - Seated Knee Extension Stretch with Chair  - 3-5 x daily - 7 x weekly - 1 sets - 1 reps - 3-5 minutes hold   ASSESSMENT:  CLINICAL IMPRESSION: Patient is improving ROM for knee with PT interventions.  His pain appears to be improving.  Patient is able to perform more functional activities.     OBJECTIVE IMPAIRMENTS Abnormal gait, decreased activity tolerance, decreased balance, decreased endurance, decreased knowledge of condition, decreased mobility, difficulty walking, decreased ROM, decreased strength, decreased safety awareness, increased edema, impaired perceived functional ability, and pain.    ACTIVITY LIMITATIONS occupation and ADLs .    PERSONAL FACTORS Education, Fitness, and Transportation are also affecting patient's functional outcome.      REHAB POTENTIAL: Good   CLINICAL DECISION MAKING: Stable/uncomplicated   EVALUATION COMPLEXITY: Low     GOALS: Goals reviewed with patient? Yes   SHORT TERM GOALS: Target date: 10/07/2021     Improve L knee extension AROM to -10 or better Baseline: -20 degrees Goal status: INITIAL   2.  Marcus [name] will be independent with his day 1 HEP. Baseline: Started 09/09/2021 Goal status: INITIAL   3.  Marcus [name] will be comfortable using a cane with gait. Baseline: Wheeled walker Goal status: INITIAL   LONG TERM GOALS: Target date: 11/04/2021     Improve FOTO to 63. Baseline: 41 Goal status: INITIAL   2.  Improve L knee pain to consistently 0-3/10 on the Numeric Pain Rating Scale. Baseline: 4-8/10 Goal status: INITIAL   3.  Improve L knee AROM to -3 to 110 degrees or better. Baseline: -20 to 95 degrees. Goal status: INITIAL   4.  Improve B quadriceps strength as assessed by gait without an AD, objective measures and functional scores. Baseline: Walker and see FOTO above Goal status: INITIAL   5.  Marcus [name] will be independent with his HEP at DC. Baseline: Started 09/09/2021 Goal status: INITIAL   PLAN: PT FREQUENCY:  2-3x/week   PT DURATION: 8 weeks   PLANNED INTERVENTIONS: Therapeutic exercises, Therapeutic activity, Neuromuscular re-education, Balance training, Gait training, Patient/Family education, Joint mobilization, Stair training, Cryotherapy, Vasopneumatic device, Manual therapy, and Re-evaluation   PLAN FOR NEXT SESSION:   manual therapy & therapeutic exer working on range & strength. Progress standing & functional activities. 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 Progress standing time at home if he reports increased tolerance.  Vaso to end.     Vladimir Faster, PT, DPT 09/24/2021, 9:29 AM

## 2021-09-30 ENCOUNTER — Ambulatory Visit (INDEPENDENT_AMBULATORY_CARE_PROVIDER_SITE_OTHER): Payer: 59

## 2021-09-30 ENCOUNTER — Ambulatory Visit (INDEPENDENT_AMBULATORY_CARE_PROVIDER_SITE_OTHER): Payer: 59 | Admitting: Physician Assistant

## 2021-09-30 ENCOUNTER — Encounter: Payer: Self-pay | Admitting: Rehabilitative and Restorative Service Providers"

## 2021-09-30 ENCOUNTER — Ambulatory Visit (INDEPENDENT_AMBULATORY_CARE_PROVIDER_SITE_OTHER): Payer: 59 | Admitting: Rehabilitative and Restorative Service Providers"

## 2021-09-30 DIAGNOSIS — Z96652 Presence of left artificial knee joint: Secondary | ICD-10-CM | POA: Diagnosis not present

## 2021-09-30 DIAGNOSIS — R600 Localized edema: Secondary | ICD-10-CM | POA: Diagnosis not present

## 2021-09-30 DIAGNOSIS — M25562 Pain in left knee: Secondary | ICD-10-CM

## 2021-09-30 DIAGNOSIS — M6281 Muscle weakness (generalized): Secondary | ICD-10-CM | POA: Diagnosis not present

## 2021-09-30 DIAGNOSIS — M25662 Stiffness of left knee, not elsewhere classified: Secondary | ICD-10-CM | POA: Diagnosis not present

## 2021-09-30 DIAGNOSIS — R262 Difficulty in walking, not elsewhere classified: Secondary | ICD-10-CM

## 2021-09-30 MED ORDER — HYDROCODONE-ACETAMINOPHEN 5-325 MG PO TABS: 1.0000 | ORAL_TABLET | Freq: Three times a day (TID) | ORAL | 0 refills | Status: DC | PRN

## 2021-09-30 NOTE — Progress Notes (Unsigned)
Post-Op Visit Note   Patient: Marcus [name]           Date of Birth: [birth year]           MRN: [id number] Visit Date: 09/30/2021 PCP: Rema Fendt, NP   Assessment & Plan:  Chief Complaint:  Chief Complaint  Patient presents with   Left Knee - Routine Post Op   Visit Diagnoses:  1. Status post total left knee replacement     Plan: Patient is a pleasant 61 year old gentleman who comes in today 6 weeks status post left total knee replacement 08/18/2021.  He has been doing relatively well.  He has been in a fair amount of pain lately but has been out of his pain medicine.  He has been in physical therapy making slow but steady progress.  He has also been compliant taking his baby aspirin twice daily for DVT prophylaxis.  Examination of the left knee reveals a fully healed surgical scar without complication.  Calf soft nontender.  He is neurovascular intact distally.  Range of motion 10 to 115 degrees.  He stable to valgus varus stress.  At this point, he needs to continue pushing things with physical therapy as well as a home exercise program specifically to work on extension.  I sent in Norco to hopefully help facilitate this.  He may go ahead and discontinue the baby aspirin that we have him on for DVT prophylaxis.  Dental prophylaxis reinforced.  Follow-up with [place] in 6 weeks time for repeat evaluation.  Call with concerns or questions in meantime.  Follow-Up Instructions: Return in about 6 weeks (around 11/11/2021).   Orders:  Orders Placed This Encounter  Procedures   XR Knee 1-2 Views Left   Meds ordered this encounter  Medications   HYDROcodone-acetaminophen (NORCO) 5-325 MG tablet    Sig: Take 1 tablet by mouth 3 (three) times daily as needed.    Dispense:  30 tablet    Refill:  0    Imaging: XR Knee 1-2 Views Left  Result Date: 09/30/2021 Well-seated prosthesis without complication   PMFS History: Patient Active Problem List   Diagnosis Date Noted   Primary  osteoarthritis of left knee 08/18/2021   Status post total left knee replacement 08/18/2021   Past Medical History:  Diagnosis Date   Diabetes mellitus without complication (HCC)    Hypertension     Family History  Problem Relation Age of Onset   Cancer Father    Lung disease Neg Hx     Past Surgical History:  Procedure Laterality Date   ankle surgery Left 1980   TOTAL KNEE ARTHROPLASTY Left 08/18/2021   Procedure: LEFT TOTAL KNEE ARTHROPLASTY;  Surgeon: Tarry Kos, MD;  Location: [facility] OR;  Service: Orthopedics;  Laterality: Left;   Social History   Occupational History   Not on file  Tobacco Use   Smoking status: Every Day    Packs/day: 0.50    Years: 50.00    Pack years: 25.00    Types: Cigarettes    Start date: 1969   Smokeless tobacco: Never   Tobacco comments:    0.5ppd as of 03/17/21 ep  Vaping Use   Vaping Use: Never used  Substance and Sexual Activity   Alcohol use: Yes    Alcohol/week: 6.0 standard drinks    Types: 6 Standard drinks or equivalent per week   Drug use: Yes    Types: Marijuana   Sexual activity: Not Currently

## 2021-09-30 NOTE — Therapy (Signed)
OUTPATIENT PHYSICAL THERAPY TREATMENT NOTE   Patient Name: Marcus [name] MRN: [id number] DOB:1961-03-14, 61 y.o., male Today's Date: 09/30/2021  PCP: Camillia Herter, NP REFERRING PROVIDER: Leandrew Koyanagi, MD  Progress Note Reporting Period 09/09/2021 to 09/30/2021  See note below for Objective Data and Assessment of Progress/Goals.      END OF SESSION:   PT End of Session - 09/30/21 0935     Visit Number 5    Number of Visits 15    Authorization Type [employer]    Authorization Time Period $10 co-pay  30 visit limit PT/OT/chiro    PT Start Time 0931    PT Stop Time 1015    PT Time Calculation (min) 44 min    Activity Tolerance Patient tolerated treatment well    Behavior During Therapy WFL for tasks assessed/performed                Past Medical History:  Diagnosis Date   Diabetes mellitus without complication ([place])    Hypertension    Past Surgical History:  Procedure Laterality Date   ankle surgery Left 1980   TOTAL KNEE ARTHROPLASTY Left 08/18/2021   Procedure: LEFT TOTAL KNEE ARTHROPLASTY;  Surgeon: Leandrew Koyanagi, MD;  Location: [facility] [facility];  Service: Orthopedics;  Laterality: Left;   Patient Active Problem List   Diagnosis Date Noted   Primary osteoarthritis of left knee 08/18/2021   Status post total left knee replacement 08/18/2021    REFERRING DIAG: I37.048 (ICD-10-CM) - Status post total left knee replacement  ONSET DATE: August 18, 2021  THERAPY DIAG:  Difficulty in walking, not elsewhere classified  Muscle weakness (generalized)  Localized edema  Stiffness of left knee, not elsewhere classified  Left knee pain, unspecified chronicity  PERTINENT HISTORY: DM, HTN, smoker  PRECAUTIONS: Knee  SUBJECTIVE: Marcus [name] [name] very happy with his early post-surgical progress.   PAIN:  Are you having pain?  Yes: NPRS scale: This week 3-7/10 Pain location: Left knee  Pain description: Burning, swollen,ache, can be sharp Aggravating factors: Too much WB  or prolonged postures Relieving factors: Ice   OBJECTIVE: (objective measures completed at initial evaluation unless otherwise dated)  OBJECTIVE:    DIAGNOSTIC FINDINGS: FINDINGS: 06/10/2021: There are postsurgical changes from left total knee arthroplasty. Hardware components are in anatomic alignment. Gas [name] identified within the suprapatellar joint space and the soft tissues along the anterior surface of the knee.   PATIENT SURVEYS:  FOTO 09/30/2021: 56 (Goal 63) FOTO 09/09/2021:  41 (Goal 63 in 15 visits)     SENSATION: 09/09/2021:  Marcus [name] notes no paresthesias or numbness   LE ROM:   ROM P:passive  A:active Right 09/09/21 Left 09/09/21 Left 09/17/21 Left 09/24/21 Left 09/30/2021  Hip flexion         Hip extension         Hip abduction         Hip adduction         Hip internal rotation         Hip external rotation         Knee flexion A: 138 A: 95 Seated P: 98 A: 97 Seated P: 99 A: 98 Supine 116  Knee extension A: 0 A: -20 Seated P: -9 A: -14 Seated P: -7 A: -11 Supine -14 (was -20)  Ankle dorsiflexion         Ankle plantarflexion         Ankle inversion  Ankle eversion          (Blank rows = not tested)   LE MMT:   MMT Right 09/09/21 Left 09/09/21   Hip flexion       Hip extension       Hip abduction       Hip adduction       Hip internal rotation       Hip external rotation       Knee flexion       Knee extension 4/5 2/5   Ankle dorsiflexion       Ankle plantarflexion       Ankle inversion       Ankle eversion        (Blank rows = not tested)   GAIT: 09/30/2021: Marcus [name] has transitioned to a cane full-time  Evaluation Comments: Marcus [name] using a wheeled walker and [name] out of work as he recovers from his TKA.      TODAY'S TREATMENT: 09/30/2021 Recumbent bike Seat 7 and 5 for 4 minutes each (work on extension and flexion, Seat 8 only next visit)  [place]gate knee flexion 1 minute  AAROM knee flexion with self-overpressure 10X 10  seconds  Quadriceps sets with heel prop 2 sets of 10X 5 seconds  Seated knee extension stretch 3 minutes with 3#  Prone knee extension stretch 2# for 3 minutes (rolled up towel above both knees)   09/24/2021 Therapeutic Exercise:  Aerobic: Scifit recumbent bike LEs only seat 9 level 2 for 8 min. Machines for strength:  Leg press BLEs 87# 15 reps 2 sets 5 sec hold ext & flex with focus on range and LLE only 37# 15 reps Supine:  Prone:  Seated: LAQ 4# & active knee flexion with contralateral LE opposing motion 15 reps 2 sets 5 sec hold slow eccentric   Hamstring curl green theraband controlled eccentric 15 reps.  Standing: heel raises on incline board 15 reps.    Gastroc stretch on incline board 30 sec hold 2 reps. Neuromuscular Re-education: Manual Therapy: PROM left knee with mild distraction & overpressure for both flexion & extension.  Therapeutic Activity:  PT demo & verbal cues on cane location, knee ext in stance & knee flexion in swing.  Pt return demo with additional verbal cues.  Self Care:  PT reviewed recommendation for standing & or walking 10 min of 10 hour period during day to prepare for work. Pt verbalized understanding.  Trigger Point Dry Needling:  Modalities: Vaso left knee medium compression 34* 10 min with elevation    09/17/2021 Therapeutic Exercise:  Aerobic: Scifit recumbent bike LEs only seat 9 level 3 for 5 min, then level 1 for 3 min for total 8 min. Machines for strength:  Leg press BLEs 75# 15 reps 2 sets Supine: quad set ankle on towel roll then SLR 15 reps 2 sets  Prone:  Seated: LAQ 3# & active knee flexion with contralateral LE opposing motion 15 reps 2 sets 5 sec hold slow eccentric  Standing: Neuromuscular Re-education: Manual Therapy: PROM left knee with mild distraction & overpressure for both flexion & extension.  Therapeutic Activity:   Self Care:  PT educated on exercising / moving knee >/= 5 min of each awake hour.  Pt recommended standing &  or walking 10 min of 10 hour period during day to prepare for work. PT demo & verbal cues on positioning Lt knee seated & supine with both ext & flexion. Pt verbalized understanding.  Trigger Point Dry  Needling:  Modalities: Vaso left knee medium compression 34* 10 min with elevation      HOME EXERCISE PROGRAM: Access Code: [email or web address] URL: [email or web address] Date: 09/09/2021 Prepared by: Vista Mink   Exercises - Supine Quadricep Sets  - 3-5 x daily - 7 x weekly - 2-3 sets - 10 reps - 5 second hold - Seated Knee Flexion AAROM  - 3-5 x daily - 7 x weekly - 1 sets - 1 reps - 3 minutes hold - Seated Knee Extension Stretch with Chair  - 3-5 x daily - 7 x weekly - 1 sets - 1 reps - 3-5 minutes hold   ASSESSMENT:  CLINICAL IMPRESSION: Marcus [name] [name] moving better and reports better function as compared to before starting his post-surgical PT.  Extension AROM and strength are the focus of his home and clinic programs, particularly extension AROM.  Getting his L knee straight and improving strength will help get Marcus [name] to work as he [name] very motivated to get [name] ASAP.   OBJECTIVE IMPAIRMENTS Abnormal gait, decreased activity tolerance, decreased balance, decreased endurance, decreased knowledge of condition, decreased mobility, difficulty walking, decreased ROM, decreased strength, decreased safety awareness, increased edema, impaired perceived functional ability, and pain.    ACTIVITY LIMITATIONS occupation and ADLs .    PERSONAL FACTORS Education, Fitness, and Transportation are also affecting patient's functional outcome.      REHAB POTENTIAL: Good   CLINICAL DECISION MAKING: Stable/uncomplicated   EVALUATION COMPLEXITY: Low     GOALS: Goals reviewed with patient? Yes   SHORT TERM GOALS: Target date: 10/07/2021    Improve L knee extension AROM to -10 or better Baseline: -20 degrees Goal status: On Going (-14) 09/30/2021   2.  Marcus [name] will be independent with his day 1  HEP. Baseline: Started 09/09/2021 Goal status: Met   3.  Marcus [name] will be comfortable using a cane with gait. Baseline: Wheeled walker Goal status: Met 09/30/2021   LONG TERM GOALS: Target date: 11/04/2021     Improve FOTO to 63. Baseline: 41 Goal status: On Going (56) 09/30/2021   2.  Improve L knee pain to consistently 0-3/10 on the Numeric Pain Rating Scale. Baseline: 4-8/10 Goal status: On Going (3-7/10) 09/30/2021   3.  Improve L knee AROM to -3 to 110 degrees or better. Baseline: -20 to 95 degrees. Goal status: -14 to 116 On Going 09/30/2021   4.  Improve B quadriceps strength as assessed by gait without an AD, objective measures and functional scores. Baseline: Walker and see FOTO above Goal status: On Going 09/30/2021   5.  Marcus [name] will be independent with his HEP at DC. Baseline: Started 09/09/2021 Goal status: On Going 09/30/2021   PLAN: PT FREQUENCY:  2-3x/week   PT DURATION: 8 weeks   PLANNED INTERVENTIONS: Therapeutic exercises, Therapeutic activity, Neuromuscular re-education, Balance training, Gait training, Patient/Family education, Joint mobilization, Stair training, Cryotherapy, Vasopneumatic device, Manual therapy, and Re-evaluation   PLAN FOR NEXT SESSION:   Extension AROM and strength. 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  Vaso to end.     Farley Ly, PT, MPT 09/30/2021, 11:26 AM

## 2021-10-02 ENCOUNTER — Ambulatory Visit (INDEPENDENT_AMBULATORY_CARE_PROVIDER_SITE_OTHER): Payer: 59 | Admitting: Rehabilitative and Restorative Service Providers"

## 2021-10-02 ENCOUNTER — Encounter: Payer: Self-pay | Admitting: Rehabilitative and Restorative Service Providers"

## 2021-10-02 DIAGNOSIS — R262 Difficulty in walking, not elsewhere classified: Secondary | ICD-10-CM | POA: Diagnosis not present

## 2021-10-02 DIAGNOSIS — M6281 Muscle weakness (generalized): Secondary | ICD-10-CM | POA: Diagnosis not present

## 2021-10-02 DIAGNOSIS — R600 Localized edema: Secondary | ICD-10-CM

## 2021-10-02 DIAGNOSIS — M25562 Pain in left knee: Secondary | ICD-10-CM

## 2021-10-02 DIAGNOSIS — M25662 Stiffness of left knee, not elsewhere classified: Secondary | ICD-10-CM

## 2021-10-02 NOTE — Therapy (Signed)
OUTPATIENT PHYSICAL THERAPY TREATMENT NOTE   Patient Name: Marcus [name] MRN: [id number] DOB:06/10/1960, 61 y.o., male Today's Date: 10/02/2021  PCP: Camillia Herter, NP REFERRING PROVIDER: Leandrew Koyanagi, MD  Progress Note Reporting Period 09/09/2021 to 09/30/2021  See note below for Objective Data and Assessment of Progress/Goals.      END OF SESSION:   PT End of Session - 10/02/21 0938     Visit Number 6    Number of Visits 15    Authorization Type [employer]    Authorization Time Period $10 co-pay  30 visit limit PT/OT/chiro    PT Start Time 0931    PT Stop Time 1012    PT Time Calculation (min) 41 min    Activity Tolerance Patient tolerated treatment well;No increased pain    Behavior During Therapy WFL for tasks assessed/performed             Past Medical History:  Diagnosis Date   Diabetes mellitus without complication ([place])    Hypertension    Past Surgical History:  Procedure Laterality Date   ankle surgery Left 1980   TOTAL KNEE ARTHROPLASTY Left 08/18/2021   Procedure: LEFT TOTAL KNEE ARTHROPLASTY;  Surgeon: Leandrew Koyanagi, MD;  Location: [place];  Service: Orthopedics;  Laterality: Left;   Patient Active Problem List   Diagnosis Date Noted   Primary osteoarthritis of left knee 08/18/2021   Status post total left knee replacement 08/18/2021    REFERRING DIAG: S06.301 (ICD-10-CM) - Status post total left knee replacement  ONSET DATE: August 18, 2021  THERAPY DIAG:  Difficulty in walking, not elsewhere classified  Muscle weakness (generalized)  Localized edema  Stiffness of left knee, not elsewhere classified  Left knee pain, unspecified chronicity  PERTINENT HISTORY: DM, HTN, smoker  PRECAUTIONS: Knee  SUBJECTIVE: Audiel reports 100 quadriceps sets per day at home along with frequent knee extension stretching the past 2 days.  PAIN:  Are you having pain?  Yes: NPRS scale: This week 3-7/10 Pain location: Left knee  Pain description:  Burning, swollen,ache, can be sharp Aggravating factors: Too much WB or prolonged postures Relieving factors: Ice   OBJECTIVE: (objective measures completed at initial evaluation unless otherwise dated)  OBJECTIVE:    DIAGNOSTIC FINDINGS: FINDINGS: 06/10/2021: There are postsurgical changes from left total knee arthroplasty. Hardware components are in anatomic alignment. Gas is identified within the suprapatellar joint space and the soft tissues along the anterior surface of the knee.   PATIENT SURVEYS:  FOTO 09/30/2021: 56 (Goal 63) FOTO 09/09/2021:  41 (Goal 63 in 15 visits)     SENSATION: 09/09/2021:  [name] notes no paresthesias or numbness   LE ROM:   ROM P:passive  A:active Right 09/09/21 Left 09/09/21 Left 09/17/21 Left 09/24/21 Left 09/30/2021  Hip flexion         Hip extension         Hip abduction         Hip adduction         Hip internal rotation         Hip external rotation         Knee flexion A: 138 A: 95 Seated P: 98 A: 97 Seated P: 99 A: 98 Supine 116  Knee extension A: 0 A: -20 Seated P: -9 A: -14 Seated P: -7 A: -11 Supine -14 (was -20)  Ankle dorsiflexion         Ankle plantarflexion         Ankle  inversion         Ankle eversion          (Blank rows = not tested)   LE MMT:   MMT Right 09/09/21 Left 09/09/21   Hip flexion       Hip extension       Hip abduction       Hip adduction       Hip internal rotation       Hip external rotation       Knee flexion       Knee extension 4/5 2/5   Ankle dorsiflexion       Ankle plantarflexion       Ankle inversion       Ankle eversion        (Blank rows = not tested)   GAIT: 09/30/2021: Kaulder has transitioned to a cane full-time  Evaluation Comments: Matthews is using a wheeled walker and is out of work as he recovers from his TKA.      TODAY'S TREATMENT: 10/02/2021 Recumbent bike Seat 8 (extension emphasis) 8 minutes  Quadriceps sets with heel prop 3 sets of 10X 5 seconds  Modified Thomas stretch  (L leg hangs) 2X 1 minute between sets of quad sets   Seated knee extension stretch 5 minutes with 3#  Sit to stand 2 sets of 5 slow eccentrics  Prone knee extension stretch 2# for 3 minutes (rolled up towel above both knees)   09/30/2021 Recumbent bike Seat 7 and 5 for 4 minutes each (work on extension and flexion, Seat 8 only next visit)  Tailgate knee flexion 1 minute  AAROM L knee flexion 10X 10 seconds  Quadriceps sets with heel prop 2 sets of 10X 5 seconds  Seated knee extension stretch 3 minutes with 3#  Prone knee extension stretch 2# for 3 minutes (rolled up towel above both knees)   09/24/2021 Therapeutic Exercise:  Aerobic: Scifit recumbent bike LEs only seat 9 level 2 for 8 min. Machines for strength:  Leg press BLEs 87# 15 reps 2 sets 5 sec hold ext & flex with focus on range and LLE only 37# 15 reps Supine:  Prone:  Seated: LAQ 4# & active knee flexion with contralateral LE opposing motion 15 reps 2 sets 5 sec hold slow eccentric   Hamstring curl green theraband controlled eccentric 15 reps.  Standing: heel raises on incline board 15 reps.    Gastroc stretch on incline board 30 sec hold 2 reps. Neuromuscular Re-education: Manual Therapy: PROM left knee with mild distraction & overpressure for both flexion & extension.  Therapeutic Activity:  PT demo & verbal cues on cane location, knee ext in stance & knee flexion in swing.  Pt return demo with additional verbal cues.  Self Care:  PT reviewed recommendation for standing & or walking 10 min of 10 hour period during day to prepare for work. Pt verbalized understanding.  Trigger Point Dry Needling:  Modalities: Vaso left knee medium compression 34* 10 min with elevation      HOME EXERCISE PROGRAM: Access Code: [email or web address]  URL: [email or web address] Date: 09/09/2021 Prepared by: Vista Mink   Exercises - Supine Quadricep Sets  - 3-5 x daily - 7 x weekly - 2-3 sets - 10 reps - 5 second hold -  Seated Knee Flexion AAROM  - 3-5 x daily - 7 x weekly - 1 sets - 1 reps - 3 minutes hold - Seated Knee Extension Stretch with Chair  - 3-5 x daily -  7 x weekly - 1 sets - 1 reps - 3-5 minutes hold   ASSESSMENT:  CLINICAL IMPRESSION: Rajat reports excellent HEP compliance with the activities emphasized at his last PT appointment (see HEP).  We added a supine hip flexor stretch to make sure tight hip flexors are not affecting his knee extension in standing and supine.  Extension AROM and strength remain the focus of his home and clinic programs, particularly extension AROM.  Getting his L knee straight and improving strength will help get Jahmeek back to work as he is very motivated to get back ASAP.   OBJECTIVE IMPAIRMENTS Abnormal gait, decreased activity tolerance, decreased balance, decreased endurance, decreased knowledge of condition, decreased mobility, difficulty walking, decreased ROM, decreased strength, decreased safety awareness, increased edema, impaired perceived functional ability, and pain.    ACTIVITY LIMITATIONS occupation and ADLs .    PERSONAL FACTORS Education, Fitness, and Transportation are also affecting patient's functional outcome.      REHAB POTENTIAL: Good   CLINICAL DECISION MAKING: Stable/uncomplicated   EVALUATION COMPLEXITY: Low     GOALS: Goals reviewed with patient? Yes   SHORT TERM GOALS: Target date: 10/07/2021    Improve L knee extension AROM to -10 or better Baseline: -20 degrees Goal status: On Going (-14) 09/30/2021   2.  Graves will be independent with his day 1 HEP. Baseline: Started 09/09/2021 Goal status: Met   3.  Ashur will be comfortable using a cane with gait. Baseline: Wheeled walker Goal status: Met 09/30/2021   LONG TERM GOALS: Target date: 11/04/2021     Improve FOTO to 63. Baseline: 41 Goal status: On Going (56) 09/30/2021   2.  Improve L knee pain to consistently 0-3/10 on the Numeric Pain Rating Scale. Baseline: 4-8/10 Goal  status: On Going (3-7/10) 09/30/2021   3.  Improve L knee AROM to -3 to 110 degrees or better. Baseline: -20 to 95 degrees. Goal status: -14 to 116 On Going 09/30/2021   4.  Improve B quadriceps strength as assessed by gait without an AD, objective measures and functional scores. Baseline: Walker and see FOTO above Goal status: On Going 09/30/2021   5.  Camerin will be independent with his HEP at DC. Baseline: Started 09/09/2021 Goal status: On Going 09/30/2021   PLAN: PT FREQUENCY:  2-3x/week   PT DURATION: 8 weeks   PLANNED INTERVENTIONS: Therapeutic exercises, Therapeutic activity, Neuromuscular re-education, Balance training, Gait training, Patient/Family education, Joint mobilization, Stair training, Cryotherapy, Vasopneumatic device, Manual therapy, and Re-evaluation   PLAN FOR NEXT SESSION:   Extension AROM and progress quadriceps strength as able and as time allows. 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  Vaso to end.     Farley Ly, PT, MPT 10/02/2021, 10:13 AM

## 2021-10-07 ENCOUNTER — Ambulatory Visit (INDEPENDENT_AMBULATORY_CARE_PROVIDER_SITE_OTHER): Payer: 59 | Admitting: Physical Therapy

## 2021-10-07 ENCOUNTER — Encounter: Payer: Self-pay | Admitting: Physical Therapy

## 2021-10-07 DIAGNOSIS — M25662 Stiffness of left knee, not elsewhere classified: Secondary | ICD-10-CM | POA: Diagnosis not present

## 2021-10-07 DIAGNOSIS — R262 Difficulty in walking, not elsewhere classified: Secondary | ICD-10-CM

## 2021-10-07 DIAGNOSIS — M6281 Muscle weakness (generalized): Secondary | ICD-10-CM

## 2021-10-07 DIAGNOSIS — M25562 Pain in left knee: Secondary | ICD-10-CM

## 2021-10-07 DIAGNOSIS — R600 Localized edema: Secondary | ICD-10-CM | POA: Diagnosis not present

## 2021-10-07 NOTE — Therapy (Signed)
OUTPATIENT PHYSICAL THERAPY TREATMENT NOTE   Patient Name: Marcus [name] MRN: [id number] DOB:[birth year], 61 y.o., male Today's Date: 10/07/2021  PCP: Camillia Herter, NP REFERRING PROVIDER: Leandrew Koyanagi, MD     END OF SESSION:   PT End of Session - 10/07/21 0849     Visit Number 7    Number of Visits 15    Authorization Type [employer]    Authorization Time Period $10 co-pay  30 visit limit PT/OT/chiro    PT Start Time 0845    PT Stop Time 0930    PT Time Calculation (min) 45 min    Activity Tolerance Patient tolerated treatment well;No increased pain    Behavior During Therapy WFL for tasks assessed/performed              Past Medical History:  Diagnosis Date   Diabetes mellitus without complication ([place])    Hypertension    Past Surgical History:  Procedure Laterality Date   ankle surgery Left 1980   TOTAL KNEE ARTHROPLASTY Left 08/18/2021   Procedure: LEFT TOTAL KNEE ARTHROPLASTY;  Surgeon: Leandrew Koyanagi, MD;  Location: [facility];  Service: Orthopedics;  Laterality: Left;   Patient Active Problem List   Diagnosis Date Noted   Primary osteoarthritis of left knee 08/18/2021   Status post total left knee replacement 08/18/2021    REFERRING DIAG: U13.244 (ICD-10-CM) - Status post total left knee replacement  ONSET DATE: August 18, 2021  THERAPY DIAG:  Difficulty in walking, not elsewhere classified  Muscle weakness (generalized)  Localized edema  Stiffness of left knee, not elsewhere classified  Left knee pain, unspecified chronicity  PERTINENT HISTORY: DM, HTN, smoker  PRECAUTIONS: Knee  SUBJECTIVE: He continues to do his exercises. The knee feels more stable. The quad burns sometimes.   PAIN:  Are you having pain?  Yes: NPRS scale: this morning 0/10 This week  0/10 - 2/10 Pain location: Left knee  Pain description: Burning, swollen,ache, can be sharp Aggravating factors: standing over 20-30 minutes Relieving factors: Ice   OBJECTIVE:  (objective measures completed at initial evaluation unless otherwise dated)  OBJECTIVE:    DIAGNOSTIC FINDINGS: FINDINGS: 06/10/2021: There are postsurgical changes from left total knee arthroplasty. Hardware components are in anatomic alignment. Gas is identified within the suprapatellar joint space and the soft tissues along the anterior surface of the knee.   PATIENT SURVEYS:  FOTO 09/30/2021: 56 (Goal 63) FOTO 09/09/2021:  41 (Goal 63 in 15 visits)     SENSATION: 09/09/2021:  [name] notes no paresthesias or numbness   LE ROM:   ROM P:passive  A:active Right 09/09/21 Left 09/09/21 Left 09/17/21 Left 09/24/21 Left 09/30/2021  Hip flexion         Hip extension         Hip abduction         Hip adduction         Hip internal rotation         Hip external rotation         Knee flexion A: 138 A: 95 Seated P: 98 A: 97 Seated P: 99 A: 98 Supine 116  Knee extension A: 0 A: -20 Seated P: -9 A: -14 Seated P: -7 A: -11 Supine -14 (was -20)  Ankle dorsiflexion         Ankle plantarflexion         Ankle inversion         Ankle eversion          (  Blank rows = not tested)   LE MMT:   MMT Right 09/09/21 Left 09/09/21   Hip flexion       Hip extension       Hip abduction       Hip adduction       Hip internal rotation       Hip external rotation       Knee flexion       Knee extension 4/5 2/5   Ankle dorsiflexion       Ankle plantarflexion       Ankle inversion       Ankle eversion        (Blank rows = not tested)   GAIT: 09/30/2021: Dutch has transitioned to a cane full-time  Evaluation Comments: Saatvik is using a wheeled walker and is out of work as he recovers from his TKA.      TODAY'S TREATMENT: 10/07/2021 Therapeutic Exercise: SciFit (seat 10 for full ext) recumbent bike with LEs only level 3 for 8 min Gastroc stretch on step with heel depression 30 sec hold 2 reps LLE ext with Left foot (1/2 off step) on first step tapping RLE from ground to 2nd step with knee ext  using right rail 15 reps.   Terminal knee ext without resistance 10 reps, with blue theraband resistance 15 reps & then blue theraband with RLE hip flexion for LLE SLS 10 reps.  Therapeutic Activities: Stairs alternating pattern 11 steps with right rail light 3 reps with verbal cues for technique & knee function  Neuromuscular Re-ed:  Standing bilateral stance feet 2-3" apart with equal weight bearing with pelvic weight shift.   Weight shift right, midline & left with tactile & verbal cues. Weight shift forward to forefoot progressing to bil heel raises with knee ext and posterior onto heels raising toes with knee ext.  10/02/2021 Recumbent bike Seat 8 (extension emphasis) 8 minutes  Quadriceps sets with heel prop 3 sets of 10X 5 seconds  Modified Thomas stretch (L leg hangs) 2X 1 minute between sets of quad sets   Seated knee extension stretch 5 minutes with 3#  Sit to stand 2 sets of 5 slow eccentrics  Prone knee extension stretch 2# for 3 minutes (rolled up towel above both knees)   09/30/2021 Recumbent bike Seat 7 and 5 for 4 minutes each (work on extension and flexion, Seat 8 only next visit)  Tailgate knee flexion 1 minute  AAROM L knee flexion 10X 10 seconds  Quadriceps sets with heel prop 2 sets of 10X 5 seconds  Seated knee extension stretch 3 minutes with 3#  Prone knee extension stretch 2# for 3 minutes (rolled up towel above both knees)   09/24/2021 Therapeutic Exercise:  Aerobic: Scifit recumbent bike LEs only seat 9 level 2 for 8 min. Machines for strength:  Leg press BLEs 87# 15 reps 2 sets 5 sec hold ext & flex with focus on range and LLE only 37# 15 reps Supine:  Prone:  Seated: LAQ 4# & active knee flexion with contralateral LE opposing motion 15 reps 2 sets 5 sec hold slow eccentric   Hamstring curl green theraband controlled eccentric 15 reps.  Standing: heel raises on incline board 15 reps.    Gastroc stretch on incline board 30 sec hold 2  reps. Neuromuscular Re-education: Manual Therapy: PROM left knee with mild distraction & overpressure for both flexion & extension.  Therapeutic Activity:  PT demo & verbal cues on cane location, knee ext  in stance & knee flexion in swing.  Pt return demo with additional verbal cues.  Self Care:  PT reviewed recommendation for standing & or walking 10 min of 10 hour period during day to prepare for work. Pt verbalized understanding.  Trigger Point Dry Needling:  Modalities: Vaso left knee medium compression 34* 10 min with elevation      HOME EXERCISE PROGRAM: Access Code: [email or web address]  URL: [email or web address] Date: 09/09/2021 Prepared by: Vista Mink   Exercises - Supine Quadricep Sets  - 3-5 x daily - 7 x weekly - 2-3 sets - 10 reps - 5 second hold - Seated Knee Flexion AAROM  - 3-5 x daily - 7 x weekly - 1 sets - 1 reps - 3 minutes hold - Seated Knee Extension Stretch with Chair  - 3-5 x daily - 7 x weekly - 1 sets - 1 reps - 3-5 minutes hold   ASSESSMENT:  CLINICAL IMPRESSION: PT worked on functionally extending left knee during activities. He improved with PT instruction & repetition.  Pt continues to benefit from skilled PT to maximize mobility & safety.  His pain continues to improve.    OBJECTIVE IMPAIRMENTS Abnormal gait, decreased activity tolerance, decreased balance, decreased endurance, decreased knowledge of condition, decreased mobility, difficulty walking, decreased ROM, decreased strength, decreased safety awareness, increased edema, impaired perceived functional ability, and pain.    ACTIVITY LIMITATIONS occupation and ADLs .    PERSONAL FACTORS Education, Fitness, and Transportation are also affecting patient's functional outcome.    REHAB POTENTIAL: Good   CLINICAL DECISION MAKING: Stable/uncomplicated   EVALUATION COMPLEXITY: Low     GOALS: Goals reviewed with patient? Yes   SHORT TERM GOALS: Target date: 10/07/2021    Improve L knee  extension AROM to -10 or better Baseline: -20 degrees Goal status: On Going (-14) 09/30/2021   2.  Bradly will be independent with his day 1 HEP. Baseline: Started 09/09/2021 Goal status: Met   3.  Khadeem will be comfortable using a cane with gait. Baseline: Wheeled walker Goal status: Met 09/30/2021   LONG TERM GOALS: Target date: 11/04/2021     Improve FOTO to 63. Baseline: 41 Goal status: On Going (56) 09/30/2021   2.  Improve L knee pain to consistently 0-3/10 on the Numeric Pain Rating Scale. Baseline: 4-8/10 Goal status: On Going (3-7/10) 09/30/2021   3.  Improve L knee AROM to -3 to 110 degrees or better. Baseline: -20 to 95 degrees. Goal status: -14 to 116 On Going 09/30/2021   4.  Improve B quadriceps strength as assessed by gait without an AD, objective measures and functional scores. Baseline: Walker and see FOTO above Goal status: On Going 09/30/2021   5.  Helen will be independent with his HEP at DC. Baseline: Started 09/09/2021 Goal status: On Going 09/30/2021   PLAN: PT FREQUENCY:  2-3x/week   PT DURATION: 8 weeks   PLANNED INTERVENTIONS: Therapeutic exercises, Therapeutic activity, Neuromuscular re-education, Balance training, Gait training, Patient/Family education, Joint mobilization, Stair training, Cryotherapy, Vasopneumatic device, Manual therapy, and Re-evaluation   PLAN FOR NEXT SESSION:   check ROM, continue work to increase knee function. Manual therapy & exercise to increase extension.  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  Vaso to end.     [name], PT, DPT 10/07/2021, 4:05 PM

## 2021-10-09 ENCOUNTER — Encounter: Payer: Self-pay | Admitting: Rehabilitative and Restorative Service Providers"

## 2021-10-09 ENCOUNTER — Ambulatory Visit (INDEPENDENT_AMBULATORY_CARE_PROVIDER_SITE_OTHER): Payer: 59 | Admitting: Rehabilitative and Restorative Service Providers"

## 2021-10-09 DIAGNOSIS — M25662 Stiffness of left knee, not elsewhere classified: Secondary | ICD-10-CM | POA: Diagnosis not present

## 2021-10-09 DIAGNOSIS — M25562 Pain in left knee: Secondary | ICD-10-CM

## 2021-10-09 DIAGNOSIS — M6281 Muscle weakness (generalized): Secondary | ICD-10-CM

## 2021-10-09 DIAGNOSIS — R262 Difficulty in walking, not elsewhere classified: Secondary | ICD-10-CM

## 2021-10-09 DIAGNOSIS — R600 Localized edema: Secondary | ICD-10-CM

## 2021-10-09 NOTE — Therapy (Signed)
OUTPATIENT PHYSICAL THERAPY TREATMENT NOTE   Patient Name: Marcus [name] MRN: [id number] DOB:1961/03/14, 61 y.o., male Today's Date: 10/09/2021  PCP: Marcus Herter, NP REFERRING PROVIDER: Leandrew Koyanagi, MD     END OF SESSION:   PT End of Session - 10/09/21 0939     Visit Number 8    Number of Visits 15    Authorization Type [employer]    Authorization Time Period $10 co-pay  30 visit limit PT/OT/chiro    PT Start Time 0931    PT Stop Time 1021    PT Time Calculation (min) 50 min    Activity Tolerance Patient tolerated treatment well;No increased pain    Behavior During Therapy WFL for tasks assessed/performed             Past Medical History:  Diagnosis Date   Diabetes mellitus without complication ([place])    Hypertension    Past Surgical History:  Procedure Laterality Date   ankle surgery Left 1980   TOTAL KNEE ARTHROPLASTY Left 08/18/2021   Procedure: LEFT TOTAL KNEE ARTHROPLASTY;  Surgeon: Marcus Koyanagi, MD;  Location: [facility];  Service: Orthopedics;  Laterality: Left;   Patient Active Problem List   Diagnosis Date Noted   Primary osteoarthritis of left knee 08/18/2021   Status post total left knee replacement 08/18/2021    REFERRING DIAG: Q49.201 (ICD-10-CM) - Status post total left knee replacement  ONSET DATE: August 18, 2021  THERAPY DIAG:  Difficulty in walking, not elsewhere classified  Muscle weakness (generalized)  Localized edema  Stiffness of left knee, not elsewhere classified  Left knee pain, unspecified chronicity  PERTINENT HISTORY: DM, HTN, smoker  PRECAUTIONS: Knee  SUBJECTIVE: Marcus [name] reports excellent HEP compliance.  He notes less pain, less edema and more strength over the past +/- 1 week.  PAIN:  Are you having pain?  Yes: NPRS scale: this morning 0/10 This week  0/10 - 2/10 Pain location: Left knee  Pain description: Burning, swollen,ache, can be sharp Aggravating factors: standing over 20-30 minutes Relieving factors:  Ice   OBJECTIVE: (objective measures completed at initial evaluation unless otherwise dated)  OBJECTIVE:    DIAGNOSTIC FINDINGS: FINDINGS: 06/10/2021: There are postsurgical changes from left total knee arthroplasty. Hardware components are in anatomic alignment. Gas is identified within the suprapatellar joint space and the soft tissues along the anterior surface of the knee.   PATIENT SURVEYS:  FOTO 09/30/2021: 56 (Goal 63) FOTO 09/09/2021:  41 (Goal 63 in 15 visits)     SENSATION: 09/09/2021:  Marcus [name] notes no paresthesias or numbness   LE ROM:   ROM P:passive  A:active Right 09/09/21 Left 09/09/21 Left 09/17/21 Left 09/24/21 Left 09/30/2021 Left 10/09/2021  Hip flexion          Hip extension          Hip abduction          Hip adduction          Hip internal rotation          Hip external rotation          Knee flexion A: 138 A: 95 Seated P: 98 A: 97 Seated P: 99 A: 98 Supine 116 Supine 116  Knee extension A: 0 A: -20 Seated P: -9 A: -14 Seated P: -7 A: -11 Supine -14 (was -20) Supine -7 (was -20)  Ankle dorsiflexion          Ankle plantarflexion  Ankle inversion          Ankle eversion           (Blank rows = not tested)   LE MMT:   MMT Right 09/09/21 Left 09/09/21   Hip flexion       Hip extension       Hip abduction       Hip adduction       Hip internal rotation       Hip external rotation       Knee flexion       Knee extension 4/5 2/5   Ankle dorsiflexion       Ankle plantarflexion       Ankle inversion       Ankle eversion        (Blank rows = not tested)   GAIT: 09/30/2021: Marcus [name] has transitioned to a cane full-time  Evaluation Comments: Marcus [name] is using a wheeled walker and is out of work as he recovers from his TKA.      TODAY'S TREATMENT: 10/09/2021 Recumbent bike Seat 8 (extension emphasis) 8 minutes  Quadriceps sets with heel prop 1 set of 10X 5 seconds  Modified Thomas stretch (L leg hangs) 1 minute   Prone knee extension stretch  3# for 3 minutes (rolled up towel above both knees)  Functional Activities: Step-up and over 8 inch step 20X slow eccentrics  Leg Press for steps and sit to stand 100# double leg 15X slow eccentrics and 50# L leg only 10X slow eccentrics  Neuromuscular re-education: Tandem balance 3X each 20 seconds and slow marching 5X 10 seconds B  Vaso L knee 10 minutes 34* Medium post-activity   10/07/2021 Therapeutic Exercise: SciFit (seat 10 for full ext) recumbent bike with LEs only level 3 for 8 min Gastroc stretch on step with heel depression 30 sec hold 2 reps LLE ext with Left foot (1/2 off step) on first step tapping RLE from ground to 2nd step with knee ext using right rail 15 reps.   Terminal knee ext without resistance 10 reps, with blue theraband resistance 15 reps & then blue theraband with RLE hip flexion for LLE SLS 10 reps.  Therapeutic Activities: Stairs alternating pattern 11 steps with right rail light 3 reps with verbal cues for technique & knee function  Neuromuscular Re-ed:  Standing bilateral stance feet 2-3" apart with equal weight bearing with pelvic weight shift.   Weight shift right, midline & left with tactile & verbal cues. Weight shift forward to forefoot progressing to bil heel raises with knee ext and posterior onto heels raising toes with knee ext.   10/02/2021 Recumbent bike Seat 8 (extension emphasis) 8 minutes  Quadriceps sets with heel prop 3 sets of 10X 5 seconds  Modified Thomas stretch (L leg hangs) 2X 1 minute between sets of quad sets   Seated knee extension stretch 5 minutes with 3#  Sit to stand 2 sets of 5 slow eccentrics  Prone knee extension stretch 2# for 3 minutes (rolled up towel above both knees)     HOME EXERCISE PROGRAM: Access Code: [email or web address]  URL: [email or web address] Date: 09/09/2021 Prepared by: Marcus [name]   Exercises - Supine Quadricep Sets  - 3-5 x daily - 7 x weekly - 2-3 sets - 10 reps - 5 second hold -  Seated Knee Flexion AAROM  - 3-5 x daily - 7 x weekly - 1 sets - 1 reps - 3 minutes hold - Seated Knee Extension Stretch with Chair  -  3-5 x daily - 7 x weekly - 1 sets - 1 reps - 3-5 minutes hold   ASSESSMENT:  CLINICAL IMPRESSION: Marcus [name] has cut his extension AROM deficit in half over the past 8 days.  Extension AROM, quadriceps strength, function and edema control remain the emphasis.  His prognosis remains good to meet LTGs with his current participation.   OBJECTIVE IMPAIRMENTS Abnormal gait, decreased activity tolerance, decreased balance, decreased endurance, decreased knowledge of condition, decreased mobility, difficulty walking, decreased ROM, decreased strength, decreased safety awareness, increased edema, impaired perceived functional ability, and pain.    ACTIVITY LIMITATIONS occupation and ADLs .    PERSONAL FACTORS Education, Fitness, and Transportation are also affecting patient's functional outcome.    REHAB POTENTIAL: Good   CLINICAL DECISION MAKING: Stable/uncomplicated   EVALUATION COMPLEXITY: Low     GOALS: Goals reviewed with patient? Yes   SHORT TERM GOALS: Target date: 10/07/2021    Improve L knee extension AROM to -10 or better Baseline: -20 degrees Goal status: Met (-7) 10/09/2021   2.  Json will be independent with his day 1 HEP. Baseline: Started 09/09/2021 Goal status: Met   3.  Amahri will be comfortable using a cane with gait. Baseline: Wheeled walker Goal status: Met 09/30/2021   LONG TERM GOALS: Target date: 11/04/2021     Improve FOTO to 63. Baseline: 41 Goal status: On Going (56) 09/30/2021   2.  Improve L knee pain to consistently 0-3/10 on the Numeric Pain Rating Scale. Baseline: 4-8/10 Goal status: Met (0-2/10) 10/09/2021   3.  Improve L knee AROM to -3 to 110 degrees or better. Baseline: -20 to 95 degrees. Goal status: -7 to 116 On Going 10/09/2021   4.  Improve B quadriceps strength as assessed by gait without an AD, objective measures  and functional scores. Baseline: Walker and see FOTO above Goal status: On Going 10/09/2021   5.  Mohanad will be independent with his HEP at DC. Baseline: Started 09/09/2021 Goal status: On Going 10/09/2021   PLAN: PT FREQUENCY:  2-3x/week   PT DURATION: 8 weeks   PLANNED INTERVENTIONS: Therapeutic exercises, Therapeutic activity, Neuromuscular re-education, Balance training, Gait training, Patient/Family education, Joint mobilization, Stair training, Cryotherapy, Vasopneumatic device, Manual therapy, and Re-evaluation   PLAN FOR NEXT SESSION: Extension AROM, quadriceps strength, function, balance, edema control   Farley Ly, PT, MPT 10/09/2021, 10:16 AM

## 2021-10-14 ENCOUNTER — Ambulatory Visit (INDEPENDENT_AMBULATORY_CARE_PROVIDER_SITE_OTHER): Payer: 59 | Admitting: Rehabilitative and Restorative Service Providers"

## 2021-10-14 ENCOUNTER — Encounter: Payer: Self-pay | Admitting: Rehabilitative and Restorative Service Providers"

## 2021-10-14 DIAGNOSIS — M6281 Muscle weakness (generalized): Secondary | ICD-10-CM

## 2021-10-14 DIAGNOSIS — R262 Difficulty in walking, not elsewhere classified: Secondary | ICD-10-CM | POA: Diagnosis not present

## 2021-10-14 DIAGNOSIS — R600 Localized edema: Secondary | ICD-10-CM | POA: Diagnosis not present

## 2021-10-14 DIAGNOSIS — M25662 Stiffness of left knee, not elsewhere classified: Secondary | ICD-10-CM | POA: Diagnosis not present

## 2021-10-14 DIAGNOSIS — M25562 Pain in left knee: Secondary | ICD-10-CM

## 2021-10-14 NOTE — Therapy (Signed)
OUTPATIENT PHYSICAL THERAPY TREATMENT NOTE   Patient Name: Marcus [name] MRN: [id number] DOB:1960-08-21, 61 y.o., male 34 Date: 10/14/2021  PCP: Camillia Herter, NP REFERRING PROVIDER: Leandrew Koyanagi, MD     END OF SESSION:   PT End of Session - 10/14/21 0932     Visit Number 9    Number of Visits 15    Authorization Type [employer]    Authorization Time Period $10 co-pay  30 visit limit PT/OT/chiro    PT Start Time 0930    PT Stop Time 1022    PT Time Calculation (min) 52 min    Activity Tolerance Patient tolerated treatment well;No increased pain    Behavior During Therapy WFL for tasks assessed/performed             Past Medical History:  Diagnosis Date   Diabetes mellitus without complication ([place])    Hypertension    Past Surgical History:  Procedure Laterality Date   ankle surgery Left 1980   TOTAL KNEE ARTHROPLASTY Left 08/18/2021   Procedure: LEFT TOTAL KNEE ARTHROPLASTY;  Surgeon: Leandrew Koyanagi, MD;  Location: [facility];  Service: Orthopedics;  Laterality: Left;   Patient Active Problem List   Diagnosis Date Noted   Primary osteoarthritis of left knee 08/18/2021   Status post total left knee replacement 08/18/2021    REFERRING DIAG: C58.850 (ICD-10-CM) - Status post total left knee replacement  ONSET DATE: August 18, 2021  THERAPY DIAG:  Difficulty in walking, not elsewhere classified  Muscle weakness (generalized)  Localized edema  Stiffness of left knee, not elsewhere classified  Left knee pain, unspecified chronicity  PERTINENT HISTORY: DM, HTN, smoker  PRECAUTIONS: Knee  SUBJECTIVE: Lupe reports continued excellent HEP compliance.  He notes la bit more pain over the past few weeks.  He has been doing more and it has been raining a lot.  PAIN:  Are you having pain?  Yes: NPRS scale: this morning 0/10 This week  0/10 - 3/10 Pain location: Left knee  Pain description: Burning, swollen,ache, can be sharp Aggravating factors:  standing over 20-30 minutes Relieving factors: Ice   OBJECTIVE: (objective measures completed at initial evaluation unless otherwise dated)  OBJECTIVE:    DIAGNOSTIC FINDINGS: FINDINGS: 06/10/2021: There are postsurgical changes from left total knee arthroplasty. Hardware components are in anatomic alignment. Gas is identified within the suprapatellar joint space and the soft tissues along the anterior surface of the knee.   PATIENT SURVEYS:  FOTO 09/30/2021: 56 (Goal 63) FOTO 09/09/2021:  41 (Goal 63 in 15 visits)     SENSATION: 09/09/2021:  [name] notes no paresthesias or numbness   LE ROM:   ROM P:passive  A:active Right 09/09/21 Left 09/09/21 Left 09/17/21 Left 09/24/21 Left 09/30/2021 Left 10/09/2021 Left 10/14/2021  Hip flexion           Hip extension           Hip abduction           Hip adduction           Hip internal rotation           Hip external rotation           Knee flexion A: 138 A: 95 Seated P: 98 A: 97 Seated P: 99 A: 98 Supine 116 Supine 116 Supine  Knee extension A: 0 A: -20 Seated P: -9 A: -14 Seated P: -7 A: -11 Supine -14 (was -20) Supine -7 (was -20) Supine -7  Ankle dorsiflexion           Ankle plantarflexion           Ankle inversion           Ankle eversion            (Blank rows = not tested)   LE MMT:   MMT Right 09/09/21 Left 09/09/21   Hip flexion       Hip extension       Hip abduction       Hip adduction       Hip internal rotation       Hip external rotation       Knee flexion       Knee extension 4/5 2/5   Ankle dorsiflexion       Ankle plantarflexion       Ankle inversion       Ankle eversion        (Blank rows = not tested)   GAIT: 09/30/2021: Treyton has transitioned to a cane full-time  Evaluation Comments: Corben is using a wheeled walker and is out of work as he recovers from his TKA.      TODAY'S TREATMENT: 10/14/2021 Recumbent bike Seat 8 (extension emphasis) 8 minutes  Quadriceps sets with heel prop 2 sets of 10X 5  seconds  Modified Thomas stretch (L leg hangs) 1 minute   Prone knee extension stretch 3# for 4 minutes (rolled up towel above both knees)  Functional Activities: Step-up and over 8 inch step 20X slow eccentrics, hands PRN (didn't need hands the last 10)  Leg Press for steps and sit to stand 106# double leg 15X slow eccentrics and 56# L leg only 10X slow eccentrics  Neuromuscular re-education: Single leg stance 5X 10 seconds B  Vaso L knee 10 minutes 34* Medium post-activity   10/09/2021 Recumbent bike Seat 8 (extension emphasis) 8 minutes  Quadriceps sets with heel prop 1 set of 10X 5 seconds  Modified Thomas stretch (L leg hangs) 1 minute   Prone knee extension stretch 3# for 3 minutes (rolled up towel above both knees)  Functional Activities: Step-up and over 8 inch step 20X slow eccentrics  Leg Press for steps and sit to stand 100# double leg 15X slow eccentrics and 50# L leg only 10X slow eccentrics  Neuromuscular re-education: Tandem balance 3X each 20 seconds and slow marching 5X 10 seconds B  Vaso L knee 10 minutes 34* Medium post-activity   10/07/2021 Therapeutic Exercise: SciFit (seat 10 for full ext) recumbent bike with LEs only level 3 for 8 min Gastroc stretch on step with heel depression 30 sec hold 2 reps LLE ext with Left foot (1/2 off step) on first step tapping RLE from ground to 2nd step with knee ext using right rail 15 reps.   Terminal knee ext without resistance 10 reps, with blue theraband resistance 15 reps & then blue theraband with RLE hip flexion for LLE SLS 10 reps.  Therapeutic Activities: Stairs alternating pattern 11 steps with right rail light 3 reps with verbal cues for technique & knee function  Neuromuscular Re-ed:  Standing bilateral stance feet 2-3" apart with equal weight bearing with pelvic weight shift.   Weight shift right, midline & left with tactile & verbal cues. Weight shift forward to forefoot progressing to bil heel raises  with knee ext and posterior onto heels raising toes with knee ext.    HOME EXERCISE PROGRAM: Access Code: [email or web address]  URL: [email or web address] Date:  09/09/2021 Prepared by: [facility]   Exercises - Supine Quadricep Sets  - 3-5 x daily - 7 x weekly - 2-3 sets - 10 reps - 5 second hold - Seated Knee Flexion AAROM  - 3-5 x daily - 7 x weekly - 1 sets - 1 reps - 3 minutes hold - Seated Knee Extension Stretch with Chair  - 3-5 x daily - 7 x weekly - 1 sets - 1 reps - 3-5 minutes hold   ASSESSMENT:  CLINICAL IMPRESSION: Dwayne did a great job with steps (no hands) and with single leg balance today.  Extension AROM (primary emphasis), quadriceps strength, function and edema control remain priorities.  His prognosis remains good to meet LTGs with his current participation.   OBJECTIVE IMPAIRMENTS Abnormal gait, decreased activity tolerance, decreased balance, decreased endurance, decreased knowledge of condition, decreased mobility, difficulty walking, decreased ROM, decreased strength, decreased safety awareness, increased edema, impaired perceived functional ability, and pain.    ACTIVITY LIMITATIONS occupation and ADLs .    PERSONAL FACTORS Education, Fitness, and Transportation are also affecting patient's functional outcome.    REHAB POTENTIAL: Good   CLINICAL DECISION MAKING: Stable/uncomplicated   EVALUATION COMPLEXITY: Low     GOALS: Goals reviewed with patient? Yes   SHORT TERM GOALS: Target date: 10/07/2021    Improve L knee extension AROM to -10 or better Baseline: -20 degrees Goal status: Met (-7) 10/09/2021   2.  Fredy will be independent with his day 1 HEP. Baseline: Started 09/09/2021 Goal status: Met   3.  Elba will be comfortable using a cane with gait. Baseline: Wheeled walker Goal status: Met 09/30/2021   LONG TERM GOALS: Target date: 11/04/2021     Improve FOTO to 63. Baseline: 41 Goal status: On Going (56) 09/30/2021   2.  Improve L knee  pain to consistently 0-3/10 on the Numeric Pain Rating Scale. Baseline: 4-8/10 Goal status: Met (0-2/10) 10/09/2021   3.  Improve L knee AROM to -3 to 110 degrees or better. Baseline: -20 to 95 degrees. Goal status: -7 to 116 On Going 10/14/2021   4.  Improve B quadriceps strength as assessed by gait without an AD, objective measures and functional scores. Baseline: Walker and see FOTO above Goal status: On Going 10/14/2021   5.  Deshone will be independent with his HEP at DC. Baseline: Started 09/09/2021 Goal status: On Going 10/14/2021   PLAN: PT FREQUENCY:  2-3x/week   PT DURATION: 8 weeks   PLANNED INTERVENTIONS: Therapeutic exercises, Therapeutic activity, Neuromuscular re-education, Balance training, Gait training, Patient/Family education, Joint mobilization, Stair training, Cryotherapy, Vasopneumatic device, Manual therapy, and Re-evaluation   PLAN FOR NEXT SESSION: Extension AROM (#1 emphasis), quadriceps strength, function, balance, edema control remain priorities   Farley Ly, PT, MPT 10/14/2021, 10:13 AM

## 2021-10-16 ENCOUNTER — Encounter: Payer: Self-pay | Admitting: Rehabilitative and Restorative Service Providers"

## 2021-10-16 ENCOUNTER — Ambulatory Visit (INDEPENDENT_AMBULATORY_CARE_PROVIDER_SITE_OTHER): Payer: 59 | Admitting: Rehabilitative and Restorative Service Providers"

## 2021-10-16 DIAGNOSIS — R262 Difficulty in walking, not elsewhere classified: Secondary | ICD-10-CM | POA: Diagnosis not present

## 2021-10-16 DIAGNOSIS — M25662 Stiffness of left knee, not elsewhere classified: Secondary | ICD-10-CM | POA: Diagnosis not present

## 2021-10-16 DIAGNOSIS — R600 Localized edema: Secondary | ICD-10-CM | POA: Diagnosis not present

## 2021-10-16 DIAGNOSIS — M6281 Muscle weakness (generalized): Secondary | ICD-10-CM | POA: Diagnosis not present

## 2021-10-16 DIAGNOSIS — M25562 Pain in left knee: Secondary | ICD-10-CM

## 2021-10-16 NOTE — Therapy (Signed)
OUTPATIENT PHYSICAL THERAPY TREATMENT NOTE   Patient Name: Marcus [name] MRN: [id number] DOB:1960-08-19, 61 y.o., male 55 Date: 10/16/2021  PCP: Camillia Herter, NP REFERRING PROVIDER: Leandrew Koyanagi, MD     END OF SESSION:   PT End of Session - 10/16/21 1059     Visit Number 10    Number of Visits 15    Authorization Type [employer]    Authorization Time Period $10 co-pay  30 visit limit PT/OT/chiro    PT Start Time 1057    PT Stop Time 1149    PT Time Calculation (min) 52 min    Activity Tolerance Patient tolerated treatment well;No increased pain    Behavior During Therapy WFL for tasks assessed/performed              Past Medical History:  Diagnosis Date   Diabetes mellitus without complication ([place])    Hypertension    Past Surgical History:  Procedure Laterality Date   ankle surgery Left 1980   TOTAL KNEE ARTHROPLASTY Left 08/18/2021   Procedure: LEFT TOTAL KNEE ARTHROPLASTY;  Surgeon: Leandrew Koyanagi, MD;  Location: [place];  Service: Orthopedics;  Laterality: Left;   Patient Active Problem List   Diagnosis Date Noted   Primary osteoarthritis of left knee 08/18/2021   Status post total left knee replacement 08/18/2021    REFERRING DIAG: Y30.160 (ICD-10-CM) - Status post total left knee replacement  ONSET DATE: August 18, 2021  THERAPY DIAG:  Difficulty in walking, not elsewhere classified  Muscle weakness (generalized)  Localized edema  Stiffness of left knee, not elsewhere classified  Left knee pain, unspecified chronicity  PERTINENT HISTORY: DM, HTN, smoker  PRECAUTIONS: Knee  SUBJECTIVE: Marcus [name] reports overall body ache with the poor weather this week (4 straight days of rain).  I let Marcus [name] know any joint or arthritis issues typically ache more with cold or rainy weather.  He reports continued excellent HEP compliance.    PAIN:  Are you having pain?  Yes: NPRS scale: 2-6/10 this week Pain location: Left knee  Pain description:  Burning, swollen, ache, can be sharp Aggravating factors: Too much WB or prolonged postures Relieving factors: Ice   OBJECTIVE: (objective measures completed at initial evaluation unless otherwise dated)  OBJECTIVE:    DIAGNOSTIC FINDINGS: FINDINGS: 06/10/2021: There are postsurgical changes from left total knee arthroplasty. Hardware components are in anatomic alignment. Gas is identified within the suprapatellar joint space and the soft tissues along the anterior surface of the knee.   PATIENT SURVEYS:  FOTO 09/30/2021: 56 (Goal 63) FOTO 09/09/2021:  41 (Goal 63 in 15 visits)     SENSATION: 09/09/2021:  Marcus [name] notes no paresthesias or numbness   LE ROM:   ROM P:passive  A:active Right 09/09/21 Left 09/09/21 Left 09/17/21 Left 09/24/21 Left 09/30/2021 Left 10/09/2021 Left 10/14/2021  Hip flexion           Hip extension           Hip abduction           Hip adduction           Hip internal rotation           Hip external rotation           Knee flexion A: 138 A: 95 Seated P: 98 A: 97 Seated P: 99 A: 98 Supine 116 Supine 116 Supine 117  Knee extension A: 0 A: -20 Seated P: -9 A: -14 Seated P: -7 A: -11  Supine -14 (was -20) Supine -7 (was -20) Supine -7  Ankle dorsiflexion           Ankle plantarflexion           Ankle inversion           Ankle eversion            (Blank rows = not tested)   LE MMT:   MMT Right 09/09/21 Left 09/09/21   Hip flexion       Hip extension       Hip abduction       Hip adduction       Hip internal rotation       Hip external rotation       Knee flexion       Knee extension 4/5 2/5   Ankle dorsiflexion       Ankle plantarflexion       Ankle inversion       Ankle eversion        (Blank rows = not tested)   GAIT: 09/30/2021: Marcus [name] has transitioned to a cane full-time  Evaluation Comments: Marcus [name] is using a wheeled walker and is out of work as he recovers from his TKA.      TODAY'S TREATMENT: 10/16/2021 Recumbent bike Seat 8 (extension  emphasis) 8 minutes  Quadriceps sets with heel prop 2 sets of 10X 5 seconds  Modified Thomas stretch (L leg hangs) 1 minute   Prone knee extension stretch 3# for 5 minutes (rolled up towel above both knees)  Functional Activities: Lateral tap-downs with a 4 inch step 20X slow eccentrics and lock into extension, hands PRN  Leg Press for steps and sit to stand 106# double leg 15X slow eccentrics and 56# L leg only 10X slow eccentrics  Neuromuscular re-education: Single leg stance 5X 10 seconds B  Vaso L knee 10 minutes 34* Medium post-activity   10/14/2021 Recumbent bike Seat 8 (extension emphasis) 8 minutes  Quadriceps sets with heel prop 2 sets of 10X 5 seconds  Modified Thomas stretch (L leg hangs) 1 minute   Prone knee extension stretch 3# for 4 minutes (rolled up towel above both knees)  Functional Activities: Step-up and over 8 inch step 20X slow eccentrics, hands PRN (didn't need hands the last 10)  Leg Press for steps and sit to stand 106# double leg 15X slow eccentrics and 56# L leg only 10X slow eccentrics  Neuromuscular re-education: Single leg stance 5X 10 seconds B  Vaso L knee 10 minutes 34* Medium post-activity   10/09/2021 Recumbent bike Seat 8 (extension emphasis) 8 minutes  Quadriceps sets with heel prop 1 set of 10X 5 seconds  Modified Thomas stretch (L leg hangs) 1 minute   Prone knee extension stretch 3# for 3 minutes (rolled up towel above both knees)  Functional Activities: Step-up and over 8 inch step 20X slow eccentrics  Leg Press for steps and sit to stand 100# double leg 15X slow eccentrics and 50# L leg only 10X slow eccentrics  Neuromuscular re-education: Tandem balance 3X each 20 seconds and slow marching 5X 10 seconds B  Vaso L knee 10 minutes 34* Medium post-activity     HOME EXERCISE PROGRAM: Access Code: [email or web address] URL: [email or web address] Date: 10/16/2021 Prepared by: Vista Mink  Exercises - Supine Quadricep  Sets  - 3-5 x daily - 7 x weekly - 2-3 sets - 10 reps - 5 second hold - Seated Knee Flexion AAROM  - 1-2 x daily - 7  x weekly - 1 sets - 1 reps - 3 minutes hold - Seated Knee Extension Stretch with Chair  - 3-5 x daily - 7 x weekly - 1 sets - 1 reps - 5 minutes hold - Prone Knee Extension with Ankle Weight  - 1-2 x daily - 7 x weekly - 1 sets - 1 reps - 3 minute hold - Modified Thomas Stretch  - 2 x daily - 7 x weekly - 1 sets - 1 reps - 1 minute hold   ASSESSMENT:  CLINICAL IMPRESSION: Extension AROM remains priority #1 with L quadriceps strength priority #2.  Rontavious again looked great with steps and balance in the clinic.  We discussed making sure to hold knee extension stretches (L foot in chair in extension) for at least 5 minutes (was doing 10-15 seconds) to help with returning extension AROM along with his other HEP exercises.  Continue POC.   OBJECTIVE IMPAIRMENTS Abnormal gait, decreased activity tolerance, decreased balance, decreased endurance, decreased knowledge of condition, decreased mobility, difficulty walking, decreased ROM, decreased strength, decreased safety awareness, increased edema, impaired perceived functional ability, and pain.    ACTIVITY LIMITATIONS occupation and ADLs .    PERSONAL FACTORS Education, Fitness, and Transportation are also affecting patient's functional outcome.    REHAB POTENTIAL: Good   CLINICAL DECISION MAKING: Stable/uncomplicated   EVALUATION COMPLEXITY: Low     GOALS: Goals reviewed with patient? Yes   SHORT TERM GOALS: Target date: 10/07/2021    Improve L knee extension AROM to -10 or better Baseline: -20 degrees Goal status: Met (-7) 10/09/2021   2.  Lenvil will be independent with his day 1 HEP. Baseline: Started 09/09/2021 Goal status: Met   3.  Osias will be comfortable using a cane with gait. Baseline: Wheeled walker Goal status: Met 09/30/2021   LONG TERM GOALS: Target date: 11/04/2021     Improve FOTO to 63. Baseline:  41 Goal status: On Going (56) 09/30/2021   2.  Improve L knee pain to consistently 0-3/10 on the Numeric Pain Rating Scale. Baseline: 4-8/10 Goal status: On Going (more pain with rain) 10/16/2021   3.  Improve L knee AROM to -3 to 110 degrees or better. Baseline: -20 to 95 degrees. Goal status: -6 to 116 On Going 10/14/2021   4.  Improve B quadriceps strength as assessed by gait without an AD, objective measures and functional scores. Baseline: Walker and see FOTO above Goal status: On Going 10/14/2021   5.  Brady will be independent with his HEP at DC. Baseline: Started 09/09/2021 Goal status: On Going 10/14/2021   PLAN: PT FREQUENCY:  2-3x/week   PT DURATION: 8 weeks   PLANNED INTERVENTIONS: Therapeutic exercises, Therapeutic activity, Neuromuscular re-education, Balance training, Gait training, Patient/Family education, Joint mobilization, Stair training, Cryotherapy, Vasopneumatic device, Manual therapy, and Re-evaluation   PLAN FOR NEXT SESSION: Extension AROM remains the #1 emphasis.  Quadriceps strength, function, balance and edema control.   Farley Ly, PT, MPT 10/16/2021, 11:42 AM

## 2021-10-21 ENCOUNTER — Ambulatory Visit (INDEPENDENT_AMBULATORY_CARE_PROVIDER_SITE_OTHER): Payer: 59 | Admitting: Physical Therapy

## 2021-10-21 ENCOUNTER — Encounter: Payer: Self-pay | Admitting: Physical Therapy

## 2021-10-21 DIAGNOSIS — M25662 Stiffness of left knee, not elsewhere classified: Secondary | ICD-10-CM

## 2021-10-21 DIAGNOSIS — M25562 Pain in left knee: Secondary | ICD-10-CM | POA: Diagnosis not present

## 2021-10-21 DIAGNOSIS — R262 Difficulty in walking, not elsewhere classified: Secondary | ICD-10-CM | POA: Diagnosis not present

## 2021-10-21 DIAGNOSIS — M6281 Muscle weakness (generalized): Secondary | ICD-10-CM

## 2021-10-21 DIAGNOSIS — R600 Localized edema: Secondary | ICD-10-CM

## 2021-10-23 ENCOUNTER — Encounter: Payer: Self-pay | Admitting: Rehabilitative and Restorative Service Providers"

## 2021-10-23 ENCOUNTER — Ambulatory Visit (INDEPENDENT_AMBULATORY_CARE_PROVIDER_SITE_OTHER): Payer: 59 | Admitting: Rehabilitative and Restorative Service Providers"

## 2021-10-23 DIAGNOSIS — M25662 Stiffness of left knee, not elsewhere classified: Secondary | ICD-10-CM

## 2021-10-23 DIAGNOSIS — R262 Difficulty in walking, not elsewhere classified: Secondary | ICD-10-CM | POA: Diagnosis not present

## 2021-10-23 DIAGNOSIS — M25562 Pain in left knee: Secondary | ICD-10-CM | POA: Diagnosis not present

## 2021-10-23 DIAGNOSIS — M6281 Muscle weakness (generalized): Secondary | ICD-10-CM

## 2021-10-23 DIAGNOSIS — R600 Localized edema: Secondary | ICD-10-CM

## 2021-10-23 NOTE — Therapy (Signed)
OUTPATIENT PHYSICAL THERAPY TREATMENT NOTE   Patient Name: Marcus [name] MRN: [id number] DOB:[birth year], 61 y.o., male Today's Date: 10/23/2021  PCP: Camillia Herter, NP REFERRING PROVIDER: Leandrew Koyanagi, MD     END OF SESSION:   PT End of Session - 10/23/21 1212     Visit Number 12    Number of Visits 15    PT Start Time 0934    PT Stop Time 0321    PT Time Calculation (min) 41 min    Activity Tolerance Patient tolerated treatment well;No increased pain    Behavior During Therapy WFL for tasks assessed/performed             Past Medical History:  Diagnosis Date   Diabetes mellitus without complication ([place])    Hypertension    Past Surgical History:  Procedure Laterality Date   ankle surgery Left 1980   TOTAL KNEE ARTHROPLASTY Left 08/18/2021   Procedure: LEFT TOTAL KNEE ARTHROPLASTY;  Surgeon: Leandrew Koyanagi, MD;  Location: [place];  Service: Orthopedics;  Laterality: Left;   Patient Active Problem List   Diagnosis Date Noted   Primary osteoarthritis of left knee 08/18/2021   Status post total left knee replacement 08/18/2021    REFERRING DIAG: Y24.825 (ICD-10-CM) - Status post total left knee replacement  ONSET DATE: August 18, 2021  THERAPY DIAG:  Difficulty in walking, not elsewhere classified  Left knee pain, unspecified chronicity  Stiffness of left knee, not elsewhere classified  Muscle weakness (generalized)  Localized edema  PERTINENT HISTORY: DM, HTN, smoker  PRECAUTIONS: Knee  SUBJECTIVE: Wyman notes his L knee and ankle have been waking him up at night for a few weeks at like 2-3 in the AM.  He has to sit up and move around a bit to try to get comfortable again.  He is not using the cane in the house.  He does use the cane in the yard and outside the home.  PAIN:  Are you having pain?  Yes: NPRS scale: 1-4/10 this week Pain location: Left knee and ankle Pain description: Burning, swollen, ache, can be sharp occasionally Aggravating factors:  Too much WB or prolonged postures Relieving factors: Ice   OBJECTIVE: (objective measures completed at initial evaluation unless otherwise dated)  OBJECTIVE:    DIAGNOSTIC FINDINGS: FINDINGS: 06/10/2021: There are postsurgical changes from left total knee arthroplasty. Hardware components are in anatomic alignment. Gas is identified within the suprapatellar joint space and the soft tissues along the anterior surface of the knee.   PATIENT SURVEYS:  FOTO 09/30/2021: 56 (Goal 63) FOTO 09/09/2021:  41 (Goal 63 in 15 visits)     SENSATION: 09/09/2021:  [name] notes no paresthesias or numbness   LE ROM:   ROM P:passive  A:active Right 09/09/21 Left 09/09/21 Left 09/17/21 Left 09/24/21 Left 09/30/2021 Left 10/09/2021 Left 10/14/2021 Left 10/21/21 Left 10/23/21  Hip flexion             Hip extension             Hip abduction             Hip adduction             Hip internal rotation             Hip external rotation             Knee flexion A: 138 A: 95 Seated P: 98 A: 97 Seated P: 99 A: 98 Supine 116 Supine 116 Supine 117  Supine A: 115 P: 118   Knee extension A: 0 A: -20 Seated P: -9 A: -14 Seated P: -7 A: -11 Supine -14 (was -20) Supine -7 (was -20) Supine -7 Supine A: -7 P: -6 Active supine -5  Ankle dorsiflexion             Ankle plantarflexion             Ankle inversion             Ankle eversion              (Blank rows = not tested)   LE MMT:   MMT Right 09/09/21 Left 09/09/21   Hip flexion       Hip extension       Hip abduction       Hip adduction       Hip internal rotation       Hip external rotation       Knee flexion       Knee extension 4/5 2/5   Ankle dorsiflexion       Ankle plantarflexion       Ankle inversion       Ankle eversion        (Blank rows = not tested)   GAIT: 09/30/2021: Piotr has transitioned to a cane full-time  Evaluation Comments: Lister is using a wheeled walker and is out of work as he recovers from his TKA.      TODAY'S  TREATMENT: 10/23/2021: Recumbent bike Seat 8 (extension emphasis) 5 minutes  Quadriceps sets with heel prop 2 sets of 10X 5 seconds  Modified Thomas stretch (L leg hangs) 1 minute   Prone knee extension stretch 3# for 5 minutes (rolled up towel above both knees)  Heel to toe raises 10X 3 seconds slow eccentrics  Ankle Inversion Isometrics 10X 5 seconds  Functional Activities: Lateral tap-downs with a 4 inch step 20X slow eccentrics and lock into extension, hands PRN  Leg Press for steps and sit to stand 112# double leg 15X slow eccentrics and 62# L leg only 15X slow eccentrics  Neuromuscular re-education: Single leg stance 3X 10 seconds B   10/21/2021:  TherEx:  Recumbent bike: 8 minutes seat at 4, Level 4 Calf stretch x 2 holding 30 sec Step ups: 6 inch single UE support x 15 Leg Press: 112 bil LE's 2 x 10 Leg Press: Left LE only: 62# 2 x 10 SLS: level surface 2 x 30 seconds finger tap support SLS: Airex c hand held support intermittently.  Seated SLR with quad set using extension overpressure by pt prior to each lift x 15 Modaliites:  Vasopneumatic: 34 degrees x 10 min, med compression   10/16/2021 Recumbent bike Seat 8 (extension emphasis) 8 minutes  Quadriceps sets with heel prop 2 sets of 10X 5 seconds  Modified Thomas stretch (L leg hangs) 1 minute   Prone knee extension stretch 3# for 5 minutes (rolled up towel above both knees)  Functional Activities: Lateral tap-downs with a 4 inch step 20X slow eccentrics and lock into extension, hands PRN  Leg Press for steps and sit to stand 106# double leg 15X slow eccentrics and 56# L leg only 10X slow eccentrics  Neuromuscular re-education: Single leg stance 5X 10 seconds B  Vaso L knee 10 minutes 34* Medium post-activity    HOME EXERCISE PROGRAM: Access Code: [email or web address] URL: [email or web address] Date: 10/23/2021 Prepared by: Vista Mink  Exercises - Supine Quadricep Sets  - 3-5  x daily - 7 x  weekly - 2-3 sets - 10 reps - 5 second hold - Seated Knee Flexion AAROM  - 1-2 x daily - 7 x weekly - 1 sets - 1 reps - 3 minutes hold - Seated Knee Extension Stretch with Chair  - 3-5 x daily - 7 x weekly - 1 sets - 1 reps - 5 minutes hold - Prone Knee Extension with Ankle Weight  - 1-2 x daily - 7 x weekly - 1 sets - 1 reps - 3 minute hold - Modified Thomas Stretch  - 2 x daily - 7 x weekly - 1 sets - 1 reps - 1 minute hold - Heel Raise  - 3-5 x daily - 7 x weekly - 1 sets - 10 reps - 3 seconds hold - Isometric Ankle Inversion at Wall  - 3-5 x daily - 7 x weekly - 1 sets - 10 reps - 5 seconds hold   ASSESSMENT:  CLINICAL IMPRESSION: Julen is making progress with his gait quality, WB function and extension AROM.  Extension is now lacking 5 degrees (was lacking 20 degrees at evaluation).  Gait has a mild lateral lean due to leg length differences caused by the lack of full knee extension.  Naseem also still uses a cane outside the home with longer distances.  Continue emphasis on knee extension AROM, quadriceps strength and functional progressions PRN.   OBJECTIVE IMPAIRMENTS Abnormal gait, decreased activity tolerance, decreased balance, decreased endurance, decreased knowledge of condition, decreased mobility, difficulty walking, decreased ROM, decreased strength, decreased safety awareness, increased edema, impaired perceived functional ability, and pain.    ACTIVITY LIMITATIONS occupation and ADLs .    PERSONAL FACTORS Education, Fitness, and Transportation are also affecting patient's functional outcome.    REHAB POTENTIAL: Good   CLINICAL DECISION MAKING: Stable/uncomplicated   EVALUATION COMPLEXITY: Low     GOALS: Goals reviewed with patient? Yes   SHORT TERM GOALS: Target date: 10/07/2021    Improve L knee extension AROM to -10 or better Baseline: -20 degrees Goal status: Met (-7) 10/09/2021   2.  Mycal will be independent with his day 1 HEP. Baseline: Started 09/09/2021 Goal  status: Met   3.  Dathan will be comfortable using a cane with gait. Baseline: Wheeled walker Goal status: Met 09/30/2021   LONG TERM GOALS: Target date: 11/04/2021     Improve FOTO to 63. Baseline: 41 Goal status: On Going (56) 09/30/2021   2.  Improve L knee pain to consistently 0-3/10 on the Numeric Pain Rating Scale. Baseline: depends on activity level  Goal status: On Going (1-4/10) 10/23/2021   3.  Improve L knee AROM to -3 to 110 degrees or better. Baseline: -20 to 95 degrees. Goal status: On Going 10/23/2021   4.  Improve B quadriceps strength as assessed by gait without an AD, objective measures and functional scores. Baseline: Walker and see FOTO above Goal status: On Going 10/23/2021   5.  Darrik will be independent with his HEP at DC. Baseline: Started 09/09/2021 Goal status: On Going 10/23/2021   PLAN: PT FREQUENCY:  2-3x/week   PT DURATION: 8 weeks   PLANNED INTERVENTIONS: Therapeutic exercises, Therapeutic activity, Neuromuscular re-education, Balance training, Gait training, Patient/Family education, Joint mobilization, Stair training, Cryotherapy, Vasopneumatic device, Manual therapy, and Re-evaluation   PLAN FOR NEXT SESSION: Focus on extension AROM as well as quadriceps strength, function, balance and edema control. Consider TRX squats next visit (great idea).   Farley Ly, PT, MPT  10/23/2021, 12:18 PM

## 2021-10-30 ENCOUNTER — Ambulatory Visit (INDEPENDENT_AMBULATORY_CARE_PROVIDER_SITE_OTHER): Payer: 59 | Admitting: Rehabilitative and Restorative Service Providers"

## 2021-10-30 ENCOUNTER — Encounter: Payer: Self-pay | Admitting: Rehabilitative and Restorative Service Providers"

## 2021-10-30 DIAGNOSIS — R262 Difficulty in walking, not elsewhere classified: Secondary | ICD-10-CM | POA: Diagnosis not present

## 2021-10-30 DIAGNOSIS — M25562 Pain in left knee: Secondary | ICD-10-CM

## 2021-10-30 DIAGNOSIS — M6281 Muscle weakness (generalized): Secondary | ICD-10-CM

## 2021-10-30 DIAGNOSIS — M25662 Stiffness of left knee, not elsewhere classified: Secondary | ICD-10-CM

## 2021-10-30 DIAGNOSIS — R600 Localized edema: Secondary | ICD-10-CM

## 2021-10-30 NOTE — Therapy (Signed)
OUTPATIENT PHYSICAL THERAPY TREATMENT NOTE   Patient Name: Marcus [name] MRN: [id number] DOB:May 30, 1960, 61 y.o., male Today's Date: 10/30/2021  PCP: Camillia Herter, NP REFERRING PROVIDER: Leandrew Koyanagi, MD     END OF SESSION:   PT End of Session - 10/30/21 0923     Visit Number 13    Number of Visits 15    Authorization Type [employer]    Authorization Time Period $10 co-pay  30 visit limit PT/OT/chiro    PT Start Time 0922    PT Stop Time 1015    PT Time Calculation (min) 53 min    Activity Tolerance Patient tolerated treatment well;No increased pain    Behavior During Therapy WFL for tasks assessed/performed             Past Medical History:  Diagnosis Date   Diabetes mellitus without complication (Marcus [name])    Hypertension    Past Surgical History:  Procedure Laterality Date   ankle surgery Left 1980   TOTAL KNEE ARTHROPLASTY Left 08/18/2021   Procedure: LEFT TOTAL KNEE ARTHROPLASTY;  Surgeon: Leandrew Koyanagi, MD;  Location: [place];  Service: Orthopedics;  Laterality: Left;   Patient Active Problem List   Diagnosis Date Noted   Primary osteoarthritis of left knee 08/18/2021   Status post total left knee replacement 08/18/2021    REFERRING DIAG: O70.962 (ICD-10-CM) - Status post total left knee replacement  ONSET DATE: August 18, 2021  THERAPY DIAG:  Difficulty in walking, not elsewhere classified  Left knee pain, unspecified chronicity  Stiffness of left knee, not elsewhere classified  Muscle weakness (generalized)  Localized edema  PERTINENT HISTORY: DM, HTN, smoker  PRECAUTIONS: Knee  SUBJECTIVE: Marcus [name] notes his L ankle has been much better since last week.  His knee has not been waking him up.  He [name] not using the cane in the house and uses it intermittently outside the home.  PAIN:  Are you having pain?  Yes: NPRS scale: 0-1/10 this week Pain location: Left knee and ankle Pain description: Swelling more than pain Aggravating factors: Too  much WB or prolonged postures Relieving factors: Ice   OBJECTIVE: (objective measures completed at initial evaluation unless otherwise dated)  OBJECTIVE:    DIAGNOSTIC FINDINGS: FINDINGS: 06/10/2021: There are postsurgical changes from left total knee arthroplasty. Hardware components are in anatomic alignment. Gas [name] identified within the suprapatellar joint space and the soft tissues along the anterior surface of the knee.   PATIENT SURVEYS:  FOTO 10/30/2021: 69 (Goal met) FOTO 09/30/2021: 56 (Goal 63) FOTO 09/09/2021:  41 (Goal 63 in 15 visits)     SENSATION: 09/09/2021:  [name] notes no paresthesias or numbness   LE ROM:   ROM P:passive  A:active Right 09/09/21 Left 09/09/21 Left 09/17/21 Left 09/24/21 Left 09/30/2021 Left 10/09/2021 Left 10/14/2021 Left 10/21/21 Left 10/23/21 Left 10/30/2021  Hip flexion              Hip extension              Hip abduction              Hip adduction              Hip internal rotation              Hip external rotation              Knee flexion A: 138 A: 95 Seated P: 98 A: 97 Seated P: 99 A: 98 Supine  116 Supine 116 Supine 117 Supine A: 115 P: 118  Active Supine 118  Knee extension A: 0 A: -20 Seated P: -9 A: -14 Seated P: -7 A: -11 Supine -14 (was -20) Supine -7 (was -20) Supine -7 Supine A: -7 P: -6 Active supine -5 Active Supine -4  Ankle dorsiflexion              Ankle plantarflexion              Ankle inversion              Ankle eversion               (Blank rows = not tested)   LE MMT:   MMT Right 09/09/21 Left 09/09/21 L/R in pounds 10/30/2021  Hip flexion       Hip extension       Hip abduction       Hip adduction       Hip internal rotation       Hip external rotation       Knee flexion       Knee extension 4/5 2/5 37.5/67.0  Ankle dorsiflexion       Ankle plantarflexion       Ankle inversion       Ankle eversion        (Blank rows = not tested)   GAIT: 10/30/2021: Marcus [name] uses no AD in the home and carries a cane  (occasional use ~25%) outside the home 09/30/2021: Marcus [name] has transitioned to a cane full-time  Evaluation Comments: Marcus [name] using a wheeled walker and [name] out of work as he recovers from his TKA.      TODAY'S TREATMENT: 10/30/2021: Recumbent bike Seat 8 (extension emphasis) 8 minutes  Quadriceps sets with heel prop 2 sets of 10X 5 seconds  Modified Thomas stretch (L leg hangs) 1 minute   Prone knee extension stretch 3# for 5 minutes (rolled up towel above both knees)    Functional Activities: Sit to stand no hands slow eccentrics 10X  Leg Press for steps and sit to stand 125# double leg 15X slow eccentrics and 68# L leg only 15X slow eccentrics   10/23/2021: Recumbent bike Seat 8 (extension emphasis) 5 minutes  Quadriceps sets with heel prop 2 sets of 10X 5 seconds  Modified Thomas stretch (L leg hangs) 1 minute   Prone knee extension stretch 3# for 5 minutes (rolled up towel above both knees)  Heel to toe raises 10X 3 seconds slow eccentrics  Ankle Inversion Isometrics 10X 5 seconds  Functional Activities: Lateral tap-downs with a 4 inch step 20X slow eccentrics and lock into extension, hands PRN  Leg Press for steps and sit to stand 112# double leg 15X slow eccentrics and 62# L leg only 15X slow eccentrics  Neuromuscular re-education: Single leg stance 3X 10 seconds B   10/21/2021:  TherEx:  Recumbent bike: 8 minutes seat at 4, Level 4 Calf stretch x 2 holding 30 sec Step ups: 6 inch single UE support x 15 Leg Press: 112 bil LE's 2 x 10 Leg Press: Left LE only: 62# 2 x 10 SLS: level surface 2 x 30 seconds finger tap support SLS: Airex c hand held support intermittently.  Seated SLR with quad set using extension overpressure by pt prior to each lift x 15 Modaliites:  Vasopneumatic: 34 degrees x 10 min, med compression     HOME EXERCISE PROGRAM: Access Code: [email or web address] URL: [email or web address] Date: 10/30/2021 Prepared  by: Vista Mink  Exercises - Supine Quadricep Sets  - 3-5 x daily - 7 x weekly - 2-3 sets - 10 reps - 5 second hold - Seated Knee Flexion AAROM  - 1 x daily - 1 x weekly - 1 sets - 1 reps - 3 minutes hold - Seated Knee Extension Stretch with Chair  - 3-5 x daily - 7 x weekly - 1 sets - 1 reps - 5 minutes hold - Prone Knee Extension with Ankle Weight  - 1-2 x daily - 7 x weekly - 1 sets - 1 reps - 3 minute hold - Modified Thomas Stretch  - 2 x daily - 7 x weekly - 1 sets - 1 reps - 1 minute hold - Heel Raise  - 1 x daily - 1 x weekly - 1 sets - 10 reps - 3 seconds hold - Isometric Ankle Inversion at Wall  - 1 x daily - 1 x weekly - 1 sets - 10 reps - 5 seconds hold - Sit to Stand with Armchair  - 2 x daily - 7 x weekly - 1 sets - 10 reps    ASSESSMENT:  CLINICAL IMPRESSION: Marcus [name] has made excellent progress with his post-TKA physical therapy.  Goals of -3 to 110 have been partially met (-4 to 118 degrees) and Marcus [name] [name] focused on extension AROM, edema control and strength as L quadriceps strength [name] 56% of the uninvolved R (60% expected at DC, 90-100% 9-12 months post-surgery).  Continue emphasis on knee extension AROM, quadriceps strength and functional progressions PRN with likely transfer into independent PT in 2-3 visits.   OBJECTIVE IMPAIRMENTS Abnormal gait, decreased activity tolerance, decreased balance, decreased endurance, decreased knowledge of condition, decreased mobility, difficulty walking, decreased ROM, decreased strength, decreased safety awareness, increased edema, impaired perceived functional ability, and pain.    ACTIVITY LIMITATIONS occupation and ADLs .    PERSONAL FACTORS Education, Fitness, and Transportation are also affecting patient's functional outcome.    REHAB POTENTIAL: Good   CLINICAL DECISION MAKING: Stable/uncomplicated   EVALUATION COMPLEXITY: Low     GOALS: Goals reviewed with patient? Yes   SHORT TERM GOALS: Target date: 10/07/2021    Improve L  knee extension AROM to -10 or better Baseline: -20 degrees Goal status: Met (-7) 10/09/2021   2.  Marcus [name] be independent with his day 1 [name]. Baseline: Started 09/09/2021 Goal status: Met   3.  Marcus [name] be comfortable using a cane with gait. Baseline: Wheeled walker Goal status: Met 09/30/2021   LONG TERM GOALS: Target date: 11/18/2021     Improve FOTO to 63. Baseline: 41 Goal status: Met (69) 10/30/2021   2.  Improve L knee pain to consistently 0-3/10 on the Numeric Pain Rating Scale. Baseline: depends on activity level  Goal status: Met (1-4/10) 10/30/2021   3.  Improve L knee AROM to -3 to 110 degrees or better. Baseline: -20 to 95 degrees. Goal status: On Going (-4 to 118) 10/30/2021   4.  Improve B quadriceps strength as assessed by gait without an AD, objective measures and functional scores. Baseline: Walker and see FOTO above Goal status: On Going 10/30/2021   5.  Marcus [name] [name] be independent with his [name] at DC. Baseline: Started 09/09/2021 Goal status: On Going 10/30/2021   PLAN: PT FREQUENCY:  1-2x/week   PT DURATION: 2 weeks   PLANNED INTERVENTIONS: Therapeutic exercises, Therapeutic activity, Neuromuscular re-education, Balance training, Gait training, Patient/Family education, Joint mobilization, Stair training, Cryotherapy, Vasopneumatic  device, Manual therapy, and Re-evaluation   PLAN FOR NEXT SESSION: Focus on extension AROM as well as quadriceps strength, function, balance and edema control. Consider TRX squats next visit (great idea).   Farley Ly, PT, MPT 10/30/2021, 10:09 AM

## 2021-10-31 ENCOUNTER — Encounter: Payer: 59 | Admitting: Rehabilitative and Restorative Service Providers"

## 2021-11-04 ENCOUNTER — Encounter: Payer: 59 | Admitting: Physical Therapy

## 2021-11-05 ENCOUNTER — Encounter: Payer: 59 | Admitting: Physical Therapy

## 2021-11-07 ENCOUNTER — Encounter: Payer: Self-pay | Admitting: Rehabilitative and Restorative Service Providers"

## 2021-11-07 ENCOUNTER — Ambulatory Visit (INDEPENDENT_AMBULATORY_CARE_PROVIDER_SITE_OTHER): Payer: 59 | Admitting: Rehabilitative and Restorative Service Providers"

## 2021-11-07 DIAGNOSIS — M25562 Pain in left knee: Secondary | ICD-10-CM

## 2021-11-07 DIAGNOSIS — R262 Difficulty in walking, not elsewhere classified: Secondary | ICD-10-CM

## 2021-11-07 DIAGNOSIS — M6281 Muscle weakness (generalized): Secondary | ICD-10-CM

## 2021-11-07 DIAGNOSIS — M25662 Stiffness of left knee, not elsewhere classified: Secondary | ICD-10-CM

## 2021-11-07 DIAGNOSIS — R600 Localized edema: Secondary | ICD-10-CM

## 2021-11-07 NOTE — Therapy (Addendum)
OUTPATIENT PHYSICAL THERAPY TREATMENT/PROGRESS NOTE NOTE   Patient Name: Marcus [name] MRN: [id number] DOB:Feb 08, 1961, 61 y.o., male Today's Date: 11/07/2021  PCP: Camillia Herter, NP REFERRING PROVIDER: Leandrew Koyanagi, MD   PHYSICAL THERAPY DISCHARGE SUMMARY  Visits from Start of Care: 14  Current functional level related to goals / functional outcomes: See below   Remaining deficits: See below   Education / Equipment: Pt appears independent with HEP.    Patient agrees to discharge. Patient goals were partially met. Patient is being discharged due to meeting the stated rehab goals.   [name], PT, DPT 11/20/2021, 11:31 AM   Progress Note Reporting Period 09/09/2021 to 11/07/2021  See note below for Objective Data and Assessment of Progress/Goals.        END OF SESSION:   PT End of Session - 11/07/21 1021     Visit Number 14    Number of Visits 15    Authorization Type [employer]    Authorization Time Period $10 co-pay  30 visit limit PT/OT/chiro    PT Start Time 1015    PT Stop Time 1110    PT Time Calculation (min) 55 min    Activity Tolerance Patient tolerated treatment well;No increased pain    Behavior During Therapy WFL for tasks assessed/performed              Past Medical History:  Diagnosis Date   Diabetes mellitus without complication (Marcus [place])    Hypertension    Past Surgical History:  Procedure Laterality Date   ankle surgery Left 1980   TOTAL KNEE ARTHROPLASTY Left 08/18/2021   Procedure: LEFT TOTAL KNEE ARTHROPLASTY;  Surgeon: Leandrew Koyanagi, MD;  Location: [place];  Service: Orthopedics;  Laterality: Left;   Patient Active Problem List   Diagnosis Date Noted   Primary osteoarthritis of left knee 08/18/2021   Status post total left knee replacement 08/18/2021    REFERRING DIAG: J28.786 (ICD-10-CM) - Status post total left knee replacement  ONSET DATE: August 18, 2021  THERAPY DIAG:  Difficulty in walking, not elsewhere  classified  Left knee pain, unspecified chronicity  Stiffness of left knee, not elsewhere classified  Muscle weakness (generalized)  Localized edema  PERTINENT HISTORY: DM, HTN, smoker  PRECAUTIONS: Knee  SUBJECTIVE: Marcus [name] is only using his cane intermittently outside the home.  He has not taken pain medication in over a week.  He is sore late in the day and this is consistent with remaining quadriceps strength deficits.  PAIN:  Are you having pain?  Yes: NPRS scale: 0-2/10 this week Pain location: Left knee Pain description: Swelling and fatigue more than pain Aggravating factors: Too much WB or prolonged postures Relieving factors: Ice   OBJECTIVE: (objective measures completed at initial evaluation unless otherwise dated)  OBJECTIVE:    DIAGNOSTIC FINDINGS: FINDINGS: 06/10/2021: There are postsurgical changes from left total knee arthroplasty. Hardware components are in anatomic alignment. Gas is identified within the suprapatellar joint space and the soft tissues along the anterior surface of the knee.   PATIENT SURVEYS:  FOTO 10/30/2021: 69 (Goal met) FOTO 09/30/2021: 56 (Goal 63) FOTO 09/09/2021:  41 (Goal 63 in 15 visits)     SENSATION: 09/09/2021:  Marcus [name] notes no paresthesias or numbness   LE ROM:   ROM P:passive  A:active Right 09/09/21 Left 09/09/21 Left 09/17/21 Left 09/24/21 Left 09/30/2021 Left 10/09/2021 Left 10/14/2021 Left 10/21/21 Left 10/23/21 Left 10/30/2021  Hip flexion  Hip extension              Hip abduction              Hip adduction              Hip internal rotation              Hip external rotation              Knee flexion A: 138 A: 95 Seated P: 98 A: 97 Seated P: 99 A: 98 Supine 116 Supine 116 Supine 117 Supine A: 115 P: 118  Active Supine 118  Knee extension A: 0 A: -20 Seated P: -9 A: -14 Seated P: -7 A: -11 Supine -14 (was -20) Supine -7 (was -20) Supine -7 Supine A: -7 P: -6 Active supine -5 Active Supine -4  Ankle  dorsiflexion              Ankle plantarflexion              Ankle inversion              Ankle eversion               (Blank rows = not tested)   LE MMT:   MMT Right 09/09/21 Left 09/09/21 L/R in pounds 10/30/2021 L/R in pounds 11/07/2021  Hip flexion        Hip extension        Hip abduction        Hip adduction        Hip internal rotation        Hip external rotation        Knee flexion        Knee extension 4/5 2/5 37.5/67.0 43.0/65.3  Ankle dorsiflexion        Ankle plantarflexion        Ankle inversion        Ankle eversion         (Blank rows = not tested)   GAIT: 10/30/2021: Crystal uses no AD in the home and carries a cane (occasional use ~25%) outside the home 09/30/2021: Dmarco has transitioned to a cane full-time  Evaluation Comments: Reese is using a wheeled walker and is out of work as he recovers from his TKA.      TODAY'S TREATMENT: 11/07/2021: Recumbent bike Seat 8 (extension emphasis) 8 minutes  Quadriceps sets with heel prop 2 sets of 10X 5 seconds  Modified Thomas stretch (L leg hangs) 1 minute   Prone knee extension stretch 3# for 5 minutes (rolled up towel above both knees)    Functional Activities: Sit to stand no hands slow eccentrics 5X  Leg Press for steps and sit to stand 100# double leg 15X slow eccentrics and 50# L leg only 15X slow eccentrics  Neuromuscular re-education (for reaction time when off balance): Tandem balance 3X eyes open; 3X head turning and 2X eyes closed 20 seconds (L/R and R/L)   10/30/2021: Recumbent bike Seat 8 (extension emphasis) 8 minutes  Quadriceps sets with heel prop 2 sets of 10X 5 seconds  Modified Thomas stretch (L leg hangs) 1 minute   Prone knee extension stretch 3# for 5 minutes (rolled up towel above both knees)    Functional Activities: Sit to stand no hands slow eccentrics 10X  Leg Press for steps and sit to stand 125# double leg 15X slow eccentrics and 68# L leg only 15X slow  eccentrics   10/23/2021:  Recumbent bike Seat 8 (extension emphasis) 5 minutes  Quadriceps sets with heel prop 2 sets of 10X 5 seconds  Modified Thomas stretch (L leg hangs) 1 minute   Prone knee extension stretch 3# for 5 minutes (rolled up towel above both knees)  Heel to toe raises 10X 3 seconds slow eccentrics  Ankle Inversion Isometrics 10X 5 seconds  Functional Activities: Lateral tap-downs with a 4 inch step 20X slow eccentrics and lock into extension, hands PRN  Leg Press for steps and sit to stand 112# double leg 15X slow eccentrics and 62# L leg only 15X slow eccentrics  Neuromuscular re-education: Single leg stance 3X 10 seconds B    HOME EXERCISE PROGRAM: Access Code: [email or web address] URL: [email or web address] Date: 10/30/2021 Prepared by: Vista Mink  Exercises - Supine Quadricep Sets  - 3-5 x daily - 7 x weekly - 2-3 sets - 10 reps - 5 second hold - Seated Knee Flexion AAROM  - 1 x daily - 1 x weekly - 1 sets - 1 reps - 3 minutes hold - Seated Knee Extension Stretch with Chair  - 3-5 x daily - 7 x weekly - 1 sets - 1 reps - 5 minutes hold - Prone Knee Extension with Ankle Weight  - 1-2 x daily - 7 x weekly - 1 sets - 1 reps - 3 minute hold - Modified Thomas Stretch  - 2 x daily - 7 x weekly - 1 sets - 1 reps - 1 minute hold - Heel Raise  - 1 x daily - 1 x weekly - 1 sets - 10 reps - 3 seconds hold - Isometric Ankle Inversion at Wall  - 1 x daily - 1 x weekly - 1 sets - 10 reps - 5 seconds hold - Sit to Stand with Armchair  - 2 x daily - 7 x weekly - 1 sets - 10 reps    ASSESSMENT:  CLINICAL IMPRESSION:  Elio is doing very well with his post-TKA physical therapy.  Goals of -3 to 110 have been partially met (-4 to 118 degrees) and Ramesh's HEP is focused on extension AROM, edema control and strength as L quadriceps strength is 66% of the uninvolved R (60-70% expected at DC, 90-100% 9-12 months post-surgery).  With the exception of his 4 degree extension  deficit and use of the cane (intermittently) outside the home, Cruze has met long-term goals.  Zakariya follows-up with Dr. Erlinda Hong next week to help him decide if he feels comfortable with independent rehabilitation or if he wants another 2-4 weeks at 1-2X/week to help meet remaining LTGs.   OBJECTIVE IMPAIRMENTS Abnormal gait, decreased activity tolerance, decreased balance, decreased endurance, decreased knowledge of condition, decreased mobility, difficulty walking, decreased ROM, decreased strength, decreased safety awareness, increased edema, impaired perceived functional ability, and pain.    ACTIVITY LIMITATIONS occupation and ADLs .    PERSONAL FACTORS Education, Fitness, and Transportation are also affecting patient's functional outcome.    REHAB POTENTIAL: Good   CLINICAL DECISION MAKING: Stable/uncomplicated   EVALUATION COMPLEXITY: Low     GOALS: Goals reviewed with patient? Yes   SHORT TERM GOALS: Target date: 10/07/2021    Improve L knee extension AROM to -10 or better Baseline: -20 degrees Goal status: Met (-7) 10/09/2021   2.  Kalee will be independent with his day 1 HEP. Baseline: Started 09/09/2021 Goal status: Met   3.  Christobal will be comfortable using a cane with gait. Baseline: Wheeled walker Goal status: Met 09/30/2021   LONG  TERM GOALS: Target date: 11/18/2021     Improve FOTO to 63. Baseline: 41 Goal status: Met (69) 10/30/2021   2.  Improve L knee pain to consistently 0-3/10 on the Numeric Pain Rating Scale. Baseline: depends on activity level  Goal status: Met (1-4/10) 10/30/2021   3.  Improve L knee AROM to -3 to 110 degrees or better. Baseline: -20 to 95 degrees. Goal status: partially met (-4 to 118) 10/30/2021   4.  Improve B quadriceps strength as assessed by gait without an AD, objective measures and functional scores. Baseline: see objective Goal status: MET 11/07/2021   5.  Severino will be independent with his HEP at DC. Baseline: Started 09/09/2021 Goal  status: MET 11/07/2021   PLAN: PT FREQUENCY:  1-2x/week   PT DURATION: 2-4 weeks   PLANNED INTERVENTIONS: Therapeutic exercises, Therapeutic activity, Neuromuscular re-education, Balance training, Gait training, Patient/Family education, Joint mobilization, Stair training, Cryotherapy, Vasopneumatic device, Manual therapy, and Re-evaluation   PLAN FOR NEXT SESSION: Focus on extension AROM as well as quadriceps strength, function, balance, gait dynamic drills and edema control. Consider TRX squats   Farley Ly, PT, MPT 11/07/2021, 1:05 PM

## 2021-11-11 ENCOUNTER — Ambulatory Visit (INDEPENDENT_AMBULATORY_CARE_PROVIDER_SITE_OTHER): Payer: 59 | Admitting: Physician Assistant

## 2021-11-11 ENCOUNTER — Encounter: Payer: 59 | Admitting: Physical Therapy

## 2021-11-11 ENCOUNTER — Encounter: Payer: Self-pay | Admitting: Orthopaedic Surgery

## 2021-11-11 DIAGNOSIS — Z96652 Presence of left artificial knee joint: Secondary | ICD-10-CM

## 2021-11-11 NOTE — Progress Notes (Signed)
   Post-Op Visit Note   Patient: Marcus [name]           Date of Birth: May 05, 1960           MRN: [id number] Visit Date: 11/11/2021 PCP: Rema Fendt, NP   Assessment & Plan:  Chief Complaint:  Chief Complaint  Patient presents with   Left Knee - Follow-up    Left total knee arthroplasty 08/18/2021   Visit Diagnoses:  1. Total knee replacement status, left     Plan: Patient is a pleasant [age] year old gentleman who comes in today 3 months status post left total knee replacement 08/18/2021.  He has been doing well.  Minimal discomfort when the first wakes up in the morning as well as going from seated to standing position.  He has been in physical therapy making good progress.  Continues to work on a home exercise program as well.  Examination left knee reveals a fully healed surgical scar without complication.  Range of motion 5 to 115 degrees.  He is stable vascular stress.  Is neurovascular tact distally.  At this point, continue to work on range of motion.  Dental prophylaxis reinforced.  Follow-up in 3 months for repeat evaluation and 2 view x-rays of the left knee.  Call with concerns or questions in the meantime.  Follow-Up Instructions: Return in about 3 months (around 02/11/2022).   Orders:  No orders of the defined types were placed in this encounter.  No orders of the defined types were placed in this encounter.   Imaging: No new imaging  PMFS History: Patient Active Problem List   Diagnosis Date Noted   Primary osteoarthritis of left knee 08/18/2021   Status post total left knee replacement 08/18/2021   Past Medical History:  Diagnosis Date   Diabetes mellitus without complication ([facility])    Hypertension     Family History  Problem Relation Age of Onset   Cancer Father    Lung disease Neg Hx     Past Surgical History:  Procedure Laterality Date   ankle surgery Left 1980   TOTAL KNEE ARTHROPLASTY Left 08/18/2021   Procedure: LEFT TOTAL KNEE ARTHROPLASTY;   Surgeon: Tarry Kos, MD;  Location: [facility];  Service: Orthopedics;  Laterality: Left;   Social History   Occupational History   Not on file  Tobacco Use   Smoking status: Every Day    Packs/day: 0.50    Years: 50.00    Total pack years: 25.00    Types: Cigarettes    Start date: 1969   Smokeless tobacco: Never   Tobacco comments:    0.5ppd as of 03/17/21 ep  Vaping Use   Vaping Use: Never used  Substance and Sexual Activity   Alcohol use: Yes    Alcohol/week: 6.0 standard drinks of alcohol    Types: 6 Standard drinks or equivalent per week   Drug use: Yes    Types: Marijuana   Sexual activity: Not Currently

## 2021-11-13 ENCOUNTER — Encounter: Payer: 59 | Admitting: Rehabilitative and Restorative Service Providers"

## 2022-01-05 NOTE — Progress Notes (Signed)
Patient ID: Marcus [name], male    DOB: 07-14-1960  MRN: [id number]  CC: Chronic Care Management   Subjective: Marcus [name] is a 61 y.o. male who presents for chronic care management.   His concerns today include:  Reports has not taken blood pressure or diabetes medications in at least 2 months. When asked of patient why he is not taking medications states he is stressed from recent knee surgery. Still having knee pain. He is established with Orthopedic Surgery. Recently moved in with his sister due to having no steady income. Reports he has not returned to work as of present where he was working at the [employer]. Reports he cannot return to working 10-hour shifts there because of knee issues. He is looking for a new job with less hours. States he is getting monthly social security check which limits his working hours to < 25 hours weekly. Feels he quit using his cane too quickly because he was trying to test to see if he has the strength to walk and stand for several hours without a break should he get a new job. He is not interested in anxiety/stress medication or counseling as of present.      01/19/2022   10:48 AM 09/12/2021    1:47 PM 06/23/2021    1:41 PM 05/26/2021   10:33 AM 02/24/2021   11:28 AM  Depression screen PHQ 2/9  Decreased Interest 0 0 0 0 0  Down, Depressed, Hopeless 0 0 0 0 0  PHQ - 2 Score 0 0 0 0 0  Altered sleeping     0  Tired, decreased energy     0  Change in appetite     0  Feeling bad or failure about yourself      0  Trouble concentrating     0  Moving slowly or fidgety/restless     0  Suicidal thoughts     0  PHQ-9 Score     0      Patient Active Problem List   Diagnosis Date Noted   Primary osteoarthritis of left knee 08/18/2021   Status post total left knee replacement 08/18/2021     Current Outpatient Medications on File Prior to Visit  Medication Sig Dispense Refill   albuterol (VENTOLIN HFA) 108 (90 Base) MCG/ACT inhaler Inhale 2  puffs into the lungs every 6 (six) hours as needed. 18 g 5   aspirin EC 81 MG tablet Take 1 tablet (81 mg total) by mouth 2 (two) times daily. To be taken after surgery to prevent blood clots 84 tablet 0   docusate sodium (COLACE) 100 MG capsule Take 1 capsule (100 mg total) by mouth daily as needed. 30 capsule 2   methocarbamol (ROBAXIN) 500 MG tablet Take 1 tablet (500 mg total) by mouth 2 (two) times daily as needed. 20 tablet 2   ondansetron (ZOFRAN) 4 MG tablet Take 1 tablet (4 mg total) by mouth every 8 (eight) hours as needed for nausea or vomiting. 40 tablet 0   traMADol (ULTRAM) 50 MG tablet Take 1-2 tablets (50-100 mg total) by mouth daily as needed. 20 tablet 0   umeclidinium-vilanterol (ANORO ELLIPTA) 62.5-25 MCG/ACT AEPB Inhale 1 puff into the lungs daily. 1 each 5   No current facility-administered medications on file prior to visit.    No Known Allergies  Social History   Socioeconomic History   Marital status: Single    Spouse name: Not on file   Number  of children: Not on file   Years of education: Not on file   Highest education level: Not on file  Occupational History   Not on file  Tobacco Use   Smoking status: Every Day    Packs/day: 0.50    Years: 50.00    Total pack years: 25.00    Types: Cigarettes    Start date: 1969    Passive exposure: Current   Smokeless tobacco: Never   Tobacco comments:    0.5ppd as of 03/17/21 ep  Vaping Use   Vaping Use: Never used  Substance and Sexual Activity   Alcohol use: Yes    Alcohol/week: 6.0 standard drinks of alcohol    Types: 6 Standard drinks or equivalent per week   Drug use: Yes    Types: Marijuana   Sexual activity: Not Currently  Other Topics Concern   Not on file  Social History Narrative   Not on file   Social Determinants of Health   Financial Resource Strain: Not on file  Food Insecurity: Not on file  Transportation Needs: Not on file  Physical Activity: Not on file  Stress: Not on file   Social Connections: Not on file  Intimate Partner Violence: Not on file    Family History  Problem Relation Age of Onset   Cancer Father    Lung disease Neg Hx     Past Surgical History:  Procedure Laterality Date   ankle surgery Left 1980   TOTAL KNEE ARTHROPLASTY Left 08/18/2021   Procedure: LEFT TOTAL KNEE ARTHROPLASTY;  Surgeon: Marcus Kos, MD;  Location: [facility];  Service: Orthopedics;  Laterality: Left;    ROS: Review of Systems Negative except as stated above  PHYSICAL EXAM: BP (!) 151/80   Pulse 67   Temp 98.3 F (36.8 C)   Resp 16   Ht 5' 3.07" (1.602 m)   Wt 130 lb (59 kg)   SpO2 96%   BMI 22.98 kg/m   Physical Exam HENT:     Head: Normocephalic and atraumatic.  Eyes:     Extraocular Movements: Extraocular movements intact.     Conjunctiva/sclera: Conjunctivae normal.     Pupils: Pupils are equal, round, and reactive to light.  Cardiovascular:     Rate and Rhythm: Normal rate and regular rhythm.     Pulses: Normal pulses.     Heart sounds: Normal heart sounds.  Pulmonary:     Effort: Pulmonary effort is normal.     Breath sounds: Normal breath sounds.  Musculoskeletal:     Cervical back: Normal range of motion and neck supple.  Neurological:     General: No focal deficit present.     Mental Status: He is alert and oriented to person, place, and time.  Psychiatric:        Mood and Affect: Mood normal.        Behavior: Behavior normal.     ASSESSMENT AND PLAN: 1. Essential (primary) hypertension - Blood pressure not at goal during today's visit. Patient asymptomatic without chest pressure, chest pain, palpitations, shortness of breath, worst headache of life, and any additional red flag symptoms. - Resume Amlodipine and Lisinopril as prescribed.  - Counseled on blood pressure goal of less than 130/80, low-sodium, DASH diet, medication compliance, and 150 minutes of moderate intensity exercise per week as tolerated. Counseled on medication  adherence and adverse effects. - Update BMP.  - Follow-up with primary provider in 2 weeks or sooner if needed.  - Basic  Metabolic Panel - amLODipine (NORVASC) 5 MG tablet; Take 1 tablet (5 mg total) by mouth daily.  Dispense: 30 tablet; Refill: 2 - lisinopril (ZESTRIL) 10 MG tablet; Take 1 tablet (10 mg total) by mouth daily.  Dispense: 30 tablet; Refill: 2  2. Type 2 diabetes mellitus without complication, without long-term current use of insulin (HCC) - Resume Metformin and Dapagliflozin Propanediol as prescribed.  - Discussed the importance of healthy eating habits, low-carbohydrate diet, low-sugar diet, regular aerobic exercise (at least 150 minutes a week as tolerated) and medication compliance to achieve or maintain control of diabetes. - Sending hemoglobin A1c to lab, result pending.  - Follow-up with primary provider as scheduled.  - Hemoglobin A1c - metFORMIN (GLUCOPHAGE) 1000 MG tablet; Take 1 tablet (1,000 mg total) by mouth 2 (two) times daily with a meal.  Dispense: 60 tablet; Refill: 2 - dapagliflozin propanediol (FARXIGA) 5 MG TABS tablet; Take 1 tablet (5 mg total) by mouth daily before breakfast.  Dispense: 30 tablet; Refill: 2  3. Hyperlipidemia, unspecified hyperlipidemia type - Resume Atorvastatin as prescribed.  - Follow-up with primary provider as scheduled.  - atorvastatin (LIPITOR) 20 MG tablet; Take 1 tablet (20 mg total) by mouth daily.  Dispense: 30 tablet; Refill: 2  4. Stress - Patient declined pharmacological management.  - Patient declined counseling.  - Patient aware to follow-up with me as needed.   5. Need for immunization against influenza - Administered.  - Flu Vaccine QUAD 94mo+IM (Fluarix, Fluzone & Alfiuria Quad PF)    Patient was given the opportunity to ask questions.  Patient verbalized understanding of the plan and was able to repeat key elements of the plan. Patient was given clear instructions to go to Emergency Department or return to  medical center if symptoms don't improve, worsen, or new problems develop.The patient verbalized understanding.   Orders Placed This Encounter  Procedures   Flu Vaccine QUAD 60mo+IM (Fluarix, Fluzone & Alfiuria Quad PF)   Basic Metabolic Panel   Hemoglobin A1c     Requested Prescriptions   Signed Prescriptions Disp Refills   amLODipine (NORVASC) 5 MG tablet 30 tablet 2    Sig: Take 1 tablet (5 mg total) by mouth daily.   lisinopril (ZESTRIL) 10 MG tablet 30 tablet 2    Sig: Take 1 tablet (10 mg total) by mouth daily.   atorvastatin (LIPITOR) 20 MG tablet 30 tablet 2    Sig: Take 1 tablet (20 mg total) by mouth daily.   metFORMIN (GLUCOPHAGE) 1000 MG tablet 60 tablet 2    Sig: Take 1 tablet (1,000 mg total) by mouth 2 (two) times daily with a meal.   dapagliflozin propanediol (FARXIGA) 5 MG TABS tablet 30 tablet 2    Sig: Take 1 tablet (5 mg total) by mouth daily before breakfast.    Return in about 2 weeks (around 02/02/2022) for Follow-Up or next available bp check.  Rema Fendt, NP

## 2022-01-19 ENCOUNTER — Encounter: Payer: Self-pay | Admitting: Family

## 2022-01-19 ENCOUNTER — Ambulatory Visit (INDEPENDENT_AMBULATORY_CARE_PROVIDER_SITE_OTHER): Payer: Commercial Managed Care - HMO | Admitting: Family

## 2022-01-19 VITALS — BP 151/80 | HR 67 | Temp 98.3°F | Resp 16 | Ht 63.07 in | Wt 130.0 lb

## 2022-01-19 DIAGNOSIS — E119 Type 2 diabetes mellitus without complications: Secondary | ICD-10-CM

## 2022-01-19 DIAGNOSIS — Z23 Encounter for immunization: Secondary | ICD-10-CM | POA: Diagnosis not present

## 2022-01-19 DIAGNOSIS — F439 Reaction to severe stress, unspecified: Secondary | ICD-10-CM

## 2022-01-19 DIAGNOSIS — E785 Hyperlipidemia, unspecified: Secondary | ICD-10-CM

## 2022-01-19 DIAGNOSIS — I10 Essential (primary) hypertension: Secondary | ICD-10-CM | POA: Diagnosis not present

## 2022-01-19 MED ORDER — DAPAGLIFLOZIN PROPANEDIOL 5 MG PO TABS: 5.0000 mg | ORAL_TABLET | Freq: Every day | ORAL | 2 refills | Status: AC

## 2022-01-19 MED ORDER — METFORMIN HCL 1000 MG PO TABS: 1000.0000 mg | ORAL_TABLET | Freq: Two times a day (BID) | ORAL | 2 refills | Status: DC

## 2022-01-19 MED ORDER — AMLODIPINE BESYLATE 5 MG PO TABS: 5.0000 mg | ORAL_TABLET | Freq: Every day | ORAL | 2 refills | Status: DC

## 2022-01-19 MED ORDER — ATORVASTATIN CALCIUM 20 MG PO TABS: 20.0000 mg | ORAL_TABLET | Freq: Every day | ORAL | 2 refills | Status: DC

## 2022-01-19 MED ORDER — LISINOPRIL 10 MG PO TABS: 10.0000 mg | ORAL_TABLET | Freq: Every day | ORAL | 2 refills | Status: DC

## 2022-01-19 NOTE — Progress Notes (Signed)
.  Pt presents for chronic care management  - pt has not been taking any meds for about 1-2 months due to headache

## 2022-01-20 ENCOUNTER — Other Ambulatory Visit: Payer: Self-pay | Admitting: Family

## 2022-01-20 ENCOUNTER — Telehealth: Payer: Self-pay

## 2022-01-20 DIAGNOSIS — I10 Essential (primary) hypertension: Secondary | ICD-10-CM

## 2022-01-20 DIAGNOSIS — E119 Type 2 diabetes mellitus without complications: Secondary | ICD-10-CM

## 2022-01-20 NOTE — Telephone Encounter (Signed)
  Copied from [place] [id number]. Topic: General - Other >> Jan 20, 2022  8:56 AM [name] wrote: Reason for CRM: Patient would like [name] assistance. Patient unable to come in for labs due to no transportation. Patient would like a follow up call regarding future appointments and [place] providing transportation services.  Spoke to Marcus [name] advised that he should check with insurance to see what transportation options are available, per provider if he can find a [name] location in [place] he can complete labs otherwise can wait till app on 10/11

## 2022-01-27 NOTE — Progress Notes (Signed)
Patient ID: Marcus [name], male    DOB: [birth year]  MRN: [id number]  CC: Blood Pressure Check  Subjective: Marcus [name] is a 61 y.o. male who presents for blood pressure check.  His concerns today include:  - Taking blood pressure medications as prescribed, no issues/concerns. He did take blood pressure medications prior to today's appointment.  - Taking half tablet (500 mg) once daily due to gastrointestinal side effects. Taking Farxiga, no issues/concerns.   Patient Active Problem List   Diagnosis Date Noted   Primary osteoarthritis of left knee 08/18/2021   Status post total left knee replacement 08/18/2021     Current Outpatient Medications on File Prior to Visit  Medication Sig Dispense Refill   albuterol (VENTOLIN HFA) 108 (90 Base) MCG/ACT inhaler Inhale 2 puffs into the lungs every 6 (six) hours as needed. 18 g 5   amLODipine (NORVASC) 5 MG tablet Take 1 tablet (5 mg total) by mouth daily. 30 tablet 2   aspirin EC 81 MG tablet Take 1 tablet (81 mg total) by mouth 2 (two) times daily. To be taken after surgery to prevent blood clots 84 tablet 0   atorvastatin (LIPITOR) 20 MG tablet Take 1 tablet (20 mg total) by mouth daily. 30 tablet 2   dapagliflozin propanediol (FARXIGA) 5 MG TABS tablet Take 1 tablet (5 mg total) by mouth daily before breakfast. 30 tablet 2   docusate sodium (COLACE) 100 MG capsule Take 1 capsule (100 mg total) by mouth daily as needed. 30 capsule 2   lisinopril (ZESTRIL) 10 MG tablet Take 1 tablet (10 mg total) by mouth daily. 30 tablet 2   metFORMIN (GLUCOPHAGE) 1000 MG tablet Take 1 tablet (1,000 mg total) by mouth 2 (two) times daily with a meal. 60 tablet 2   methocarbamol (ROBAXIN) 500 MG tablet Take 1 tablet (500 mg total) by mouth 2 (two) times daily as needed. 20 tablet 2   ondansetron (ZOFRAN) 4 MG tablet Take 1 tablet (4 mg total) by mouth every 8 (eight) hours as needed for nausea or vomiting. 40 tablet 0   traMADol (ULTRAM) 50 MG tablet Take  1-2 tablets (50-100 mg total) by mouth daily as needed. 20 tablet 0   umeclidinium-vilanterol (ANORO ELLIPTA) 62.5-25 MCG/ACT AEPB Inhale 1 puff into the lungs daily. 1 each 5   No current facility-administered medications on file prior to visit.    No Known Allergies  Social History   Socioeconomic History   Marital status: Single    Spouse name: Not on file   Number of children: Not on file   Years of education: Not on file   Highest education level: Not on file  Occupational History   Not on file  Tobacco Use   Smoking status: Every Day    Packs/day: 0.50    Years: 50.00    Total pack years: 25.00    Types: Cigarettes    Start date: 1969    Passive exposure: Current   Smokeless tobacco: Never   Tobacco comments:    0.5ppd as of 03/17/21 ep  Vaping Use   Vaping Use: Never used  Substance and Sexual Activity   Alcohol use: Yes    Alcohol/week: 6.0 standard drinks of alcohol    Types: 6 Standard drinks or equivalent per week   Drug use: Yes    Types: Marijuana   Sexual activity: Not Currently  Other Topics Concern   Not on file  Social History Narrative   Not on file  Social Determinants of Health   Financial Resource Strain: Not on file  Food Insecurity: Not on file  Transportation Needs: Not on file  Physical Activity: Not on file  Stress: Not on file  Social Connections: Not on file  Intimate Partner Violence: Not on file    Family History  Problem Relation Age of Onset   Cancer Father    Lung disease Neg Hx     Past Surgical History:  Procedure Laterality Date   ankle surgery Left 1980   TOTAL KNEE ARTHROPLASTY Left 08/18/2021   Procedure: LEFT TOTAL KNEE ARTHROPLASTY;  Surgeon: Tarry Kos, MD;  Location: [facility];  Service: Orthopedics;  Laterality: Left;    ROS: Review of Systems Negative except as stated above  PHYSICAL EXAM: BP (!) 152/72 (BP Location: Right Arm, Patient Position: Sitting, Cuff Size: Normal)   Pulse 76   Temp 98 F  (36.7 C)   Resp 16   Ht 5' 4.02" (1.626 m)   Wt 134 lb (60.8 kg)   SpO2 96%   BMI 22.99 kg/m   Physical Exam HENT:     Head: Normocephalic and atraumatic.  Eyes:     Extraocular Movements: Extraocular movements intact.     Conjunctiva/sclera: Conjunctivae normal.     Pupils: Pupils are equal, round, and reactive to light.  Cardiovascular:     Rate and Rhythm: Normal rate and regular rhythm.     Pulses: Normal pulses.     Heart sounds: Normal heart sounds.  Pulmonary:     Effort: Pulmonary effort is normal.     Breath sounds: Normal breath sounds.  Musculoskeletal:     Cervical back: Normal range of motion and neck supple.  Neurological:     General: No focal deficit present.     Mental Status: He is alert and oriented to person, place, and time.  Psychiatric:        Mood and Affect: Mood normal.        Behavior: Behavior normal.    Results for orders placed or performed in visit on 02/04/22  POCT glycosylated hemoglobin (Hb A1C)  Result Value Ref Range   Hemoglobin A1C 7.2 (A) 4.0 - 5.6 %   HbA1c POC (<> result, manual entry)     HbA1c, POC (prediabetic range)     HbA1c, POC (controlled diabetic range)      ASSESSMENT AND PLAN: 1. Primary hypertension - Blood pressure not at goal during today's visit. Patient asymptomatic without chest pressure, chest pain, palpitations, shortness of breath, worst headache of life, and any additional red flag symptoms. - Continue Amlodipine as prescribed. No refills needed as of present.  - Increase Lisinopril from 10 mg daily to 20 mg daily. Patient to take two 10 mg tablets daily to equal new dose. No refills needed as of present.  - Counseled on blood pressure goal of less than 130/80, low-sodium, DASH diet, medication compliance, and 150 minutes of moderate intensity exercise per week as tolerated. Counseled on medication adherence and adverse effects. - Update BMP.  - Follow-up with primary provider in 2 weeks or sooner if needed  for blood pressure check. - Basic Metabolic Panel  2. Type 2 diabetes mellitus without complication, without long-term current use of insulin (HCC) - Hemoglobin A1c relative to goal at 7.2%, goal 7%.  - Patient reports he has only been taking half tablet (500 mg) of Metformin once daily instead of prescribed Metformin 1000 mg twice daily. States he is taking half tablet  due to gastrointestinal side effects. Discussed with patient there is an extended release Metformin we can trial to see if this improves gastrointestinal side effects. Patient declines stating he does not want to waste the medication and refills he has. Therefore, discussed with patient to trial 500 mg twice daily. Patient agreeable. - Continue Dapagliflozin Propanediol as prescribed. No refills needed as of present.  - Discussed the importance of healthy eating habits, low-carbohydrate diet, low-sugar diet, regular aerobic exercise (at least 150 minutes a week as tolerated) and medication compliance to achieve or maintain control of diabetes. - Follow-up with primary provider in 2 weeks or sooner if needed.   - POCT glycosylated hemoglobin (Hb A1C)    Patient was given the opportunity to ask questions.  Patient verbalized understanding of the plan and was able to repeat key elements of the plan. Patient was given clear instructions to go to Emergency Department or return to medical center if symptoms don't improve, worsen, or new problems develop.The patient verbalized understanding.   Orders Placed This Encounter  Procedures   Basic Metabolic Panel   POCT glycosylated hemoglobin (Hb A1C)     Return in about 2 years (around 02/05/2024) for Follow-Up or next available bp check and 3 months chronic care mgmt.  Camillia Herter, NP

## 2022-01-29 ENCOUNTER — Ambulatory Visit (INDEPENDENT_AMBULATORY_CARE_PROVIDER_SITE_OTHER): Payer: 59 | Admitting: Orthopaedic Surgery

## 2022-01-29 ENCOUNTER — Ambulatory Visit (INDEPENDENT_AMBULATORY_CARE_PROVIDER_SITE_OTHER): Payer: Commercial Managed Care - HMO

## 2022-01-29 ENCOUNTER — Encounter: Payer: Self-pay | Admitting: Orthopaedic Surgery

## 2022-01-29 DIAGNOSIS — Z96652 Presence of left artificial knee joint: Secondary | ICD-10-CM | POA: Diagnosis not present

## 2022-01-29 NOTE — Progress Notes (Signed)
   Post-Op Visit Note   Patient: Marcus [name]           Date of Birth: [birth year]           MRN: [id number] Visit Date: 01/29/2022 PCP: Camillia Herter, NP   Assessment & Plan:  Chief Complaint:  Chief Complaint  Patient presents with   Left Knee - Follow-up    Left total knee arthroplasty 08/18/2021   Visit Diagnoses:  1. Total knee replacement status, left     Plan: Patient is a pleasant 61 year old gentleman who comes in today 6 months status post left total knee replacement 08/18/2021.  He has been doing relatively well but does note some soreness in the morning.  He is thinking he got rid of his cane too soon as he is having some weakness with ambulation.  Examination of his right knee reveals range of motion from 5 to 100 degrees.  He is stable valgus varus stress.  He is neurovascular intact distally.  At this point, we have discussed either returning back to formal physical therapy or working on home exercise program.  He would like to try the home exercise program first.  Dental prophylaxis reinforced.  Follow-up in 3 months for repeat evaluation.  Call with concerns or questions.  Follow-Up Instructions: Return in about 3 months (around 05/01/2022).   Orders:  Orders Placed This Encounter  Procedures   XR Knee 1-2 Views Left   No orders of the defined types were placed in this encounter.   Imaging: XR Knee 1-2 Views Left  Result Date: 01/29/2022 There is a slight gap between the femur and the anterior flange   PMFS History: Patient Active Problem List   Diagnosis Date Noted   Primary osteoarthritis of left knee 08/18/2021   Status post total left knee replacement 08/18/2021   Past Medical History:  Diagnosis Date   Diabetes mellitus without complication ([place])    Hypertension     Family History  Problem Relation Age of Onset   Cancer Father    Lung disease Neg Hx     Past Surgical History:  Procedure Laterality Date   ankle surgery Left 1980   TOTAL KNEE  ARTHROPLASTY Left 08/18/2021   Procedure: LEFT TOTAL KNEE ARTHROPLASTY;  Surgeon: Leandrew Koyanagi, MD;  Location: [facility];  Service: Orthopedics;  Laterality: Left;   Social History   Occupational History   Not on file  Tobacco Use   Smoking status: Every Day    Packs/day: 0.50    Years: 50.00    Total pack years: 25.00    Types: Cigarettes    Start date: 1969    Passive exposure: Current   Smokeless tobacco: Never   Tobacco comments:    0.5ppd as of 03/17/21 ep  Vaping Use   Vaping Use: Never used  Substance and Sexual Activity   Alcohol use: Yes    Alcohol/week: 6.0 standard drinks of alcohol    Types: 6 Standard drinks or equivalent per week   Drug use: Yes    Types: Marijuana   Sexual activity: Not Currently

## 2022-02-04 ENCOUNTER — Encounter: Payer: Self-pay | Admitting: Family

## 2022-02-04 ENCOUNTER — Ambulatory Visit (INDEPENDENT_AMBULATORY_CARE_PROVIDER_SITE_OTHER): Payer: Commercial Managed Care - HMO | Admitting: Family

## 2022-02-04 VITALS — BP 152/72 | HR 76 | Temp 98.0°F | Resp 16 | Ht 64.02 in | Wt 134.0 lb

## 2022-02-04 DIAGNOSIS — E119 Type 2 diabetes mellitus without complications: Secondary | ICD-10-CM

## 2022-02-04 DIAGNOSIS — I10 Essential (primary) hypertension: Secondary | ICD-10-CM

## 2022-02-04 LAB — POCT GLYCOSYLATED HEMOGLOBIN (HGB A1C): Hemoglobin A1C: 7.2 % — AB (ref 4.0–5.6)

## 2022-02-04 NOTE — Progress Notes (Signed)
.  Pt presents for blood pressure check   

## 2022-02-05 LAB — BASIC METABOLIC PANEL
BUN/Creatinine Ratio: 13 (ref 10–24)
BUN: 9 mg/dL (ref 8–27)
CO2: 20 mmol/L (ref 20–29)
Calcium: 9.7 mg/dL (ref 8.6–10.2)
Chloride: 103 mmol/L (ref 96–106)
Creatinine, Ser: 0.7 mg/dL — ABNORMAL LOW (ref 0.76–1.27)
Glucose: 101 mg/dL — ABNORMAL HIGH (ref 70–99)
Potassium: 4.3 mmol/L (ref 3.5–5.2)
Sodium: 141 mmol/L (ref 134–144)
eGFR: 104 mL/min/{1.73_m2} (ref >59)

## 2022-02-11 ENCOUNTER — Ambulatory Visit: Payer: 59 | Admitting: Orthopaedic Surgery

## 2022-02-17 NOTE — Progress Notes (Signed)
Patient ID: Marcus [name], male    DOB: 1960/11/23  MRN: [id number]  CC: Follow-Up Chronic Care Management  Subjective: Marcus [name] is a 61 y.o. male who presents for follow-up chronic care management.   His concerns today include:  - Doing well on blood pressure medication, no issues/concerns. Denies red flag symptoms.  - Doing well on diabetes medications. States he is taking Metformin 500 mg tablet twice daily.  - Requests referral to dentist. Has some missing teeth and would like to get dentures.    Patient Active Problem List   Diagnosis Date Noted   Primary osteoarthritis of left knee 08/18/2021   Status post total left knee replacement 08/18/2021     Current Outpatient Medications on File Prior to Visit  Medication Sig Dispense Refill   albuterol (VENTOLIN HFA) 108 (90 Base) MCG/ACT inhaler Inhale 2 puffs into the lungs every 6 (six) hours as needed. 18 g 5   amLODipine (NORVASC) 5 MG tablet Take 1 tablet (5 mg total) by mouth daily. 30 tablet 2   aspirin EC 81 MG tablet Take 1 tablet (81 mg total) by mouth 2 (two) times daily. To be taken after surgery to prevent blood clots 84 tablet 0   atorvastatin (LIPITOR) 20 MG tablet Take 1 tablet (20 mg total) by mouth daily. 30 tablet 2   dapagliflozin propanediol (FARXIGA) 5 MG TABS tablet Take 1 tablet (5 mg total) by mouth daily before breakfast. 30 tablet 2   docusate sodium (COLACE) 100 MG capsule Take 1 capsule (100 mg total) by mouth daily as needed. 30 capsule 2   lisinopril (ZESTRIL) 10 MG tablet Take 1 tablet (10 mg total) by mouth daily. 30 tablet 2   metFORMIN (GLUCOPHAGE) 1000 MG tablet Take 1 tablet (1,000 mg total) by mouth 2 (two) times daily with a meal. 60 tablet 2   methocarbamol (ROBAXIN) 500 MG tablet Take 1 tablet (500 mg total) by mouth 2 (two) times daily as needed. 20 tablet 2   ondansetron (ZOFRAN) 4 MG tablet Take 1 tablet (4 mg total) by mouth every 8 (eight) hours as needed for nausea or vomiting. 40  tablet 0   traMADol (ULTRAM) 50 MG tablet Take 1-2 tablets (50-100 mg total) by mouth daily as needed. 20 tablet 0   umeclidinium-vilanterol (ANORO ELLIPTA) 62.5-25 MCG/ACT AEPB Inhale 1 puff into the lungs daily. 1 each 5   No current facility-administered medications on file prior to visit.    No Known Allergies  Social History   Socioeconomic History   Marital status: Single    Spouse name: Not on file   Number of children: Not on file   Years of education: Not on file   Highest education level: Not on file  Occupational History   Not on file  Tobacco Use   Smoking status: Every Day    Packs/day: 0.50    Years: 50.00    Total pack years: 25.00    Types: Cigarettes    Start date: 1969    Passive exposure: Current   Smokeless tobacco: Never   Tobacco comments:    0.5ppd as of 03/17/21 ep  Vaping Use   Vaping Use: Never used  Substance and Sexual Activity   Alcohol use: Yes    Alcohol/week: 6.0 standard drinks of alcohol    Types: 6 Standard drinks or equivalent per week   Drug use: Yes    Types: Marijuana   Sexual activity: Not Currently  Other Topics Concern  Not on file  Social History Narrative   Not on file   Social Determinants of Health   Financial Resource Strain: Not on file  Food Insecurity: Not on file  Transportation Needs: Not on file  Physical Activity: Not on file  Stress: Not on file  Social Connections: Not on file  Intimate Partner Violence: Not on file    Family History  Problem Relation Age of Onset   Cancer Father    Lung disease Neg Hx     Past Surgical History:  Procedure Laterality Date   ankle surgery Left 1980   TOTAL KNEE ARTHROPLASTY Left 08/18/2021   Procedure: LEFT TOTAL KNEE ARTHROPLASTY;  Surgeon: Tarry Kos, MD;  Location: [facility];  Service: Orthopedics;  Laterality: Left;    ROS: Review of Systems Negative except as stated above  PHYSICAL EXAM: BP (!) 140/73   Pulse 75   Temp 98.3 F (36.8 C)   Resp 16    Ht 5' 4.02" (1.626 m)   Wt 135 lb (61.2 kg)   SpO2 97%   BMI 23.16 kg/m   Physical Exam HENT:     Head: Normocephalic and atraumatic.  Eyes:     Extraocular Movements: Extraocular movements intact.     Conjunctiva/sclera: Conjunctivae normal.     Pupils: Pupils are equal, round, and reactive to light.  Cardiovascular:     Rate and Rhythm: Normal rate and regular rhythm.     Pulses: Normal pulses.     Heart sounds: Normal heart sounds.  Pulmonary:     Effort: Pulmonary effort is normal.     Breath sounds: Normal breath sounds.  Musculoskeletal:     Cervical back: Normal range of motion and neck supple.  Neurological:     General: No focal deficit present.     Mental Status: He is alert and oriented to person, place, and time.  Psychiatric:        Mood and Affect: Mood normal.        Behavior: Behavior normal.    ASSESSMENT AND PLAN: 1. Primary hypertension - Blood pressure improved since last visit and relative to goal.  - Continue Amlodipine 5 mg daily as prescribed. No refills needed as of present.  - Continue Lisinopril 20 mg daily as prescribed. No refills needed as of present.  - Counseled on blood pressure goal of less than 130/80, low-sodium, DASH diet, medication compliance, and 150 minutes of moderate intensity exercise per week as tolerated. Counseled on medication adherence and adverse effects. - Follow-up with primary provider in 3 months or sooner if needed.   2. Type 2 diabetes mellitus without complication, without long-term current use of insulin (HCC) - Continue Metformin 500 mg twice daily. No refills needed as of present. - Discussed the importance of healthy eating habits, low-carbohydrate diet, low-sugar diet, regular aerobic exercise (at least 150 minutes a week as tolerated) and medication compliance to achieve or maintain control of diabetes. - Follow-up with primary provider in 3 months or sooner if needed.   3. Encounter for routine dental  examination - Referral to Dentistry for further evaluation and management.  - Ambulatory referral to Dentistry   Patient was given the opportunity to ask questions.  Patient verbalized understanding of the plan and was able to repeat key elements of the plan. Patient was given clear instructions to go to Emergency Department or return to medical center if symptoms don't improve, worsen, or new problems develop.The patient verbalized understanding.   Orders Placed  This Encounter  Procedures   Ambulatory referral to Dentistry     Return in about 2 weeks (around 03/11/2022) for Follow-Up or next available bp check.  Rema Fendt, NP

## 2022-02-25 ENCOUNTER — Ambulatory Visit (INDEPENDENT_AMBULATORY_CARE_PROVIDER_SITE_OTHER): Payer: Commercial Managed Care - HMO | Admitting: Family

## 2022-02-25 ENCOUNTER — Encounter: Payer: Self-pay | Admitting: Family

## 2022-02-25 VITALS — BP 140/73 | HR 75 | Temp 98.3°F | Resp 16 | Ht 64.02 in | Wt 135.0 lb

## 2022-02-25 DIAGNOSIS — E119 Type 2 diabetes mellitus without complications: Secondary | ICD-10-CM

## 2022-02-25 DIAGNOSIS — I10 Essential (primary) hypertension: Secondary | ICD-10-CM | POA: Diagnosis not present

## 2022-02-25 DIAGNOSIS — Z0120 Encounter for dental examination and cleaning without abnormal findings: Secondary | ICD-10-CM | POA: Diagnosis not present

## 2022-02-25 NOTE — Progress Notes (Signed)
.  Pt presents for chronic care management   

## 2022-03-02 NOTE — Progress Notes (Signed)
Erroneous encounter-disregard

## 2022-03-11 ENCOUNTER — Encounter: Payer: Commercial Managed Care - HMO | Admitting: Family

## 2022-05-01 ENCOUNTER — Ambulatory Visit: Payer: Commercial Managed Care - HMO | Admitting: Orthopaedic Surgery

## 2022-05-13 ENCOUNTER — Telehealth: Payer: Self-pay | Admitting: Physician Assistant

## 2022-05-13 ENCOUNTER — Ambulatory Visit (INDEPENDENT_AMBULATORY_CARE_PROVIDER_SITE_OTHER): Payer: Commercial Managed Care - HMO

## 2022-05-13 ENCOUNTER — Ambulatory Visit (INDEPENDENT_AMBULATORY_CARE_PROVIDER_SITE_OTHER): Payer: Commercial Managed Care - HMO | Admitting: Physician Assistant

## 2022-05-13 ENCOUNTER — Encounter: Payer: Self-pay | Admitting: Orthopaedic Surgery

## 2022-05-13 DIAGNOSIS — M79605 Pain in left leg: Secondary | ICD-10-CM

## 2022-05-13 DIAGNOSIS — Z96652 Presence of left artificial knee joint: Secondary | ICD-10-CM | POA: Diagnosis not present

## 2022-05-13 MED ORDER — METHOCARBAMOL 750 MG PO TABS
750.0000 mg | ORAL_TABLET | Freq: Two times a day (BID) | ORAL | 2 refills | Status: DC | PRN
Start: 2022-05-13 — End: 2022-08-17

## 2022-05-13 MED ORDER — PREDNISONE 10 MG (21) PO TBPK: ORAL_TABLET | ORAL | 0 refills | Status: DC

## 2022-05-13 NOTE — Addendum Note (Signed)
Addended by: Lendon Collar on: 05/13/2022 09:32 AM   Modules accepted: Orders

## 2022-05-13 NOTE — Telephone Encounter (Signed)
Patient called. Says he can not afford the medication sent to [facility]. Would like to know if there is a generic brand that could be called in. His call back number is [phone]

## 2022-05-13 NOTE — Progress Notes (Signed)
Office Visit Note   Patient: Marcus [name]           Date of Birth: July 05, 1960           MRN: [id number] Visit Date: 05/13/2022              Requested by: Camillia Herter, NP [address] [place],  [place] [address] PCP: Camillia Herter, NP   Assessment & Plan: Visit Diagnoses:  1. Pain in left leg   2. Status post total left knee replacement     Plan: Impression is 9 months status post left total knee replacement and chronic left leg pain likely from underlying lumbar radiculopathy.  At this point, the patient's symptoms of been ongoing for a long time despite being in physical therapy for several months as well as taking prescription anti-inflammatories.  I would like to go ahead and order an MRI of the lumbar spine to assess for structural abnormalities.  He will follow-up with [place] once completed.  In regards to his knee, he will follow-up with [place] in 3 months for repeat evaluation and 2 view x-rays.  Dental prophylaxis reinforced.  Call with concerns or questions.  Follow-Up Instructions: Return for f/u after mri.   Orders:  Orders Placed This Encounter  Procedures   XR Lumbar Spine 2-3 Views   Meds ordered this encounter  Medications   predniSONE (STERAPRED UNI-PAK 21 TAB) 10 MG (21) TBPK tablet    Sig: Take as directed    Dispense:  21 tablet    Refill:  0   methocarbamol (ROBAXIN-750) 750 MG tablet    Sig: Take 1 tablet (750 mg total) by mouth 2 (two) times daily as needed for muscle spasms.    Dispense:  20 tablet    Refill:  2      Procedures: No procedures performed   Clinical Data: No additional findings.   Subjective: Chief Complaint  Patient presents with   Left Knee - Follow-up    Left total knee arthroplasty 08/18/2021    HPI patient is a pleasant 62 year old gentleman who comes in today 9 months status post left total knee replacement 08/18/2021.  His knee has been doing well but he has been complaining of pain to the left lateral hip  and left lower back which radiates down to his left calf since surgery.  Symptoms are worse first thing in the morning as well as when he is lying down at night.  He feels as though he is weak in his left leg.  Has been taking ibuprofen without significant relief.  He does note that he gets some improvement after he has been walking for a period of time.  He does also complain of numbness to the lateral knee.  No bowel or bladder change.  No history of lumbar pathology.  No groin pain.  Review of Systems as detailed in HPI.  All others reviewed and are negative.   Objective: Vital Signs: There were no vitals taken for this visit.  Physical Exam well-developed and well-nourished gentleman in no acute distress.  Alert and oriented x 3.  Ortho Exam lumbar spine exam shows no spinous or paraspinous tenderness.  No pain with lumbar flexion or extension.  He does have pain with straight leg raise.  No pain with logroll or FADIR.  Minimal tenderness to the greater trochanter.  Left knee shows decree sensation to the lateral aspect.  Range of motion from 0 to 120 degrees.  He  is stable to valgus varus stress.  No joint line tenderness.  Specialty Comments:  No specialty comments available.  Imaging: No results found.   PMFS History: Patient Active Problem List   Diagnosis Date Noted   Primary osteoarthritis of left knee 08/18/2021   Status post total left knee replacement 08/18/2021   Past Medical History:  Diagnosis Date   Diabetes mellitus without complication ([place])    Hypertension     Family History  Problem Relation Age of Onset   Cancer Father    Lung disease Neg Hx     Past Surgical History:  Procedure Laterality Date   ankle surgery Left 1980   TOTAL KNEE ARTHROPLASTY Left 08/18/2021   Procedure: LEFT TOTAL KNEE ARTHROPLASTY;  Surgeon: Leandrew Koyanagi, MD;  Location: [facility];  Service: Orthopedics;  Laterality: Left;   Social History   Occupational History   Not on file   Tobacco Use   Smoking status: Every Day    Packs/day: 0.50    Years: 50.00    Total pack years: 25.00    Types: Cigarettes    Start date: 1969    Passive exposure: Current   Smokeless tobacco: Never   Tobacco comments:    0.5ppd as of 03/17/21 ep  Vaping Use   Vaping Use: Never used  Substance and Sexual Activity   Alcohol use: Yes    Alcohol/week: 6.0 standard drinks of alcohol    Types: 6 Standard drinks or equivalent per week   Drug use: Yes    Types: Marijuana   Sexual activity: Not Currently

## 2022-05-14 ENCOUNTER — Other Ambulatory Visit: Payer: Self-pay | Admitting: Physician Assistant

## 2022-05-14 NOTE — Telephone Encounter (Signed)
Patient unsure. Just that it was around $50 for both. I'll call pharmacy to check on price and GoodRx.

## 2022-05-14 NOTE — Telephone Encounter (Signed)
Is this for the robaxin or the sterapred?  As far as I know, they should fill generic if available.  Can you check on this?

## 2022-05-14 NOTE — Telephone Encounter (Signed)
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 I could not get anyone on the phone. I recommended that he try himself. Typically steroids are pretty cheap to purchase, and hopefully he can afford that one.

## 2022-05-22 ENCOUNTER — Other Ambulatory Visit: Payer: Self-pay

## 2022-05-22 ENCOUNTER — Telehealth: Payer: Self-pay

## 2022-05-22 DIAGNOSIS — M79605 Pain in left leg: Secondary | ICD-10-CM

## 2022-05-22 NOTE — Telephone Encounter (Signed)
Called and notified patient that his MRI has not been approved for his lower back, and won't be unless he completes 6 weeks of physical therapy or home exercise program. I have ordered the PT to be done here as he requested. I explained that we need to see him 6 weeks after starting PT so that we can assess and order MRI again if needed, with documentation.

## 2022-05-26 ENCOUNTER — Other Ambulatory Visit: Payer: Commercial Managed Care - HMO

## 2022-06-01 ENCOUNTER — Encounter: Payer: Self-pay | Admitting: Physical Therapy

## 2022-06-01 ENCOUNTER — Ambulatory Visit (INDEPENDENT_AMBULATORY_CARE_PROVIDER_SITE_OTHER): Payer: Medicaid Other | Admitting: Physical Therapy

## 2022-06-01 ENCOUNTER — Other Ambulatory Visit: Payer: Self-pay

## 2022-06-01 DIAGNOSIS — R29898 Other symptoms and signs involving the musculoskeletal system: Secondary | ICD-10-CM | POA: Diagnosis not present

## 2022-06-01 DIAGNOSIS — M5416 Radiculopathy, lumbar region: Secondary | ICD-10-CM | POA: Diagnosis not present

## 2022-06-01 DIAGNOSIS — M6281 Muscle weakness (generalized): Secondary | ICD-10-CM

## 2022-06-01 NOTE — Therapy (Signed)
OUTPATIENT PHYSICAL THERAPY THORACOLUMBAR EVALUATION   Patient Name: Pat Sires MRN: [id number] DOB:Jul 23, 1960, 62 y.o., male Today's Date: 06/01/2022  END OF SESSION:  PT End of Session - 06/01/22 0936     Visit Number 1    Number of Visits 6    Date for PT Re-Evaluation 07/13/22    Authorization - Number of Visits 30    PT Start Time 0927    PT Stop Time 0956    PT Time Calculation (min) 29 min    Activity Tolerance Patient tolerated treatment well    Behavior During Therapy [facility] for tasks assessed/performed             Past Medical History:  Diagnosis Date   Diabetes mellitus without complication ([place])    Hypertension    Past Surgical History:  Procedure Laterality Date   ankle surgery Left 1980   TOTAL KNEE ARTHROPLASTY Left 08/18/2021   Procedure: LEFT TOTAL KNEE ARTHROPLASTY;  Surgeon: Leandrew Koyanagi, MD;  Location: [facility];  Service: Orthopedics;  Laterality: Left;   Patient Active Problem List   Diagnosis Date Noted   Primary osteoarthritis of left knee 08/18/2021   Status post total left knee replacement 08/18/2021    PCP: Durene Fruits, NP  REFERRING PROVIDER: Leandrew Koyanagi, MD  REFERRING DIAG: [phone] (ICD-10-CM) - Pain in left leg  Rationale for Evaluation and Treatment: Rehabilitation  THERAPY DIAG:  Radiculopathy, lumbar region - Plan: PT plan of care cert/re-cert  Muscle weakness (generalized) - Plan: PT plan of care cert/re-cert  Other symptoms and signs involving the musculoskeletal system - Plan: PT plan of care cert/re-cert  ONSET DATE: April 2023  SUBJECTIVE:                                                                                                                                                                                           SUBJECTIVE STATEMENT: Pt reports back pain that radiates from Lt hip to mid Lt shin.  Pain has been chronic since Lt TKA in April 2023.  He denies any other injury to cause pain.  Pain is worse in  the morning, and then in the evenings when lying down.  PERTINENT HISTORY:  DM, HTN, smoker, Lt TKA  PAIN:  Are you having pain? Yes: NPRS scale: no pain, just soreness; up to 8-9/10 Pain location: Lt back, hip radiating to mid calf Pain description: soreness, shooting Aggravating factors: increased activity Relieving factors: repositioning, BC powder  PRECAUTIONS: None  WEIGHT BEARING RESTRICTIONS: No  FALLS:  Has patient fallen in last 6 months? No  LIVING ENVIRONMENT: Lives with:  sister Lives in:  House/apartment Stairs: Yes: External: 6 steps; can reach both (denies difficulty with stairs at this time) Has following equipment at home: Gilford Rile - 2 wheeled  OCCUPATION: not working currently; actively looking for work  PLOF: Independent and Leisure: spend time with family  PATIENT GOALS: improve pain  NEXT MD VISIT: not scheduled, should be in about 6 weeks  OBJECTIVE:   DIAGNOSTIC FINDINGS:  MRI denied  PATIENT SURVEYS:  06/01/22 FOTO 59 (predicted 71)   SCREENING FOR RED FLAGS: Bowel or bladder incontinence: No Spinal tumors: No  COGNITION: Overall cognitive status: Within functional limits for tasks assessed     SENSATION: WFL  POSTURE: rounded shoulders, forward head, and increased lumbar lordosis  PALPATION: 06/01/22: Tenderness Rt QL and L4/5; piriformis tightness noted   LUMBAR ROM:   AROM eval  Flexion WNL  Extension Limited 25%  Right Quadrant WNL  Left Quadrant WNL   (Blank rows = not tested)  LOWER EXTREMITY MMT:    MMT Right eval Left eval  Hip flexion 5/5 5/5  Hip extension 5/5 4/5  Hip abduction 4/5 4/5  Knee flexion 5/5 5/5  Knee extension 5/5 5/5   (Blank rows = not tested)  LUMBAR SPECIAL TESTS:  06/01/22: Slump test: Negative  GAIT: 06/01/22: Comments: gait appears WNL; did have a couple LOB when performing ROM needing min A to correct  TODAY'S TREATMENT:                                                                                                                               DATE:  06/01/22 See HEP - pt performed trial reps PRN for comprehension   PATIENT EDUCATION:  Education details: HEP Person educated: Patient Education method: Consulting civil engineer, Demonstration, and Handouts Education comprehension: verbalized understanding, returned demonstration, and needs further education  HOME EXERCISE PROGRAM: Access Code: [email or web address] URL: [email or web address] Date: 06/01/2022 Prepared by: Faustino Congress  Exercises - Supine Bridge  - 2 x daily - 7 x weekly - 1 sets - 10 reps - 5 sec hold - Supine Lower Trunk Rotation  - 2 x daily - 7 x weekly - 1 sets - 5 reps - 10 sec hold - Supine Figure 4 Piriformis Stretch  - 2 x daily - 7 x weekly - 1 sets - 3 reps - 30 sec hold - Supine Piriformis Stretch with Foot on Ground  - 2 x daily - 7 x weekly - 1 sets - 3 reps - 30 sec hold - Sidelying Hip Circles  - 2 x daily - 7 x weekly - 3 sets - 10 reps  ASSESSMENT:  CLINICAL IMPRESSION: Patient is a 62 y.o. male who was seen today for physical therapy evaluation and treatment for Lt sided radiculopathy and LBP. He demonstrates decreased strength and ROM as well as trigger points and decreased flexibility affecting functional mobility.  He will benefit from PT to address deficits listed.  OBJECTIVE IMPAIRMENTS: decreased balance, decreased mobility, decreased ROM, decreased  strength, increased fascial restrictions, impaired flexibility, postural dysfunction, and pain.   ACTIVITY LIMITATIONS: carrying, lifting, bending, sitting, standing, transfers, and locomotion level  PARTICIPATION LIMITATIONS: meal prep, cleaning, community activity, occupation, and yard work  PERSONAL FACTORS: 3+ comorbidities: DM, HTN, smoker, Lt TKA  are also affecting patient's functional outcome.   REHAB POTENTIAL: Good  CLINICAL DECISION MAKING: Evolving/moderate complexity  EVALUATION COMPLEXITY: Moderate   GOALS: Goals  reviewed with patient? Yes  SHORT TERM GOALS: Target date: 06/22/2022  Independent with initial HEP Goal status: INITIAL   LONG TERM GOALS: Target date: 07/13/2022  Independent with final HEP Goal status: INITIAL  2.  FOTO score improved to 69 Goal status: INITIAL  3.  Lt hip strength improved to 5/5 for improved function and mobilty Goal status: INIITAL  4.  Report pain < 3/10 with standing/walking > 1 hour for improved function Goal status: INITIAL   PLAN:  PT FREQUENCY: 1x/week  PT DURATION: 6 weeks  PLANNED INTERVENTIONS: Therapeutic exercises, Therapeutic activity, Neuromuscular re-education, Balance training, Gait training, Patient/Family education, Self Care, Joint mobilization, DME instructions, Dry Needling, Electrical stimulation, Spinal manipulation, Spinal mobilization, Cryotherapy, Moist heat, Taping, Traction, Ultrasound, Ionotophoresis 4mg /ml Dexamethasone, Manual therapy, and Re-evaluation.  PLAN FOR NEXT SESSION: review HEP, progress hip/core strengthening, manual/modalities PRN   Laureen Abrahams, PT, DPT 06/01/22 10:05 AM

## 2022-06-10 ENCOUNTER — Encounter: Payer: Self-pay | Admitting: Physical Therapy

## 2022-06-10 ENCOUNTER — Ambulatory Visit (INDEPENDENT_AMBULATORY_CARE_PROVIDER_SITE_OTHER): Payer: Medicaid Other | Admitting: Physical Therapy

## 2022-06-10 DIAGNOSIS — M6281 Muscle weakness (generalized): Secondary | ICD-10-CM

## 2022-06-10 DIAGNOSIS — R29898 Other symptoms and signs involving the musculoskeletal system: Secondary | ICD-10-CM

## 2022-06-10 DIAGNOSIS — M5416 Radiculopathy, lumbar region: Secondary | ICD-10-CM | POA: Diagnosis not present

## 2022-06-10 DIAGNOSIS — R262 Difficulty in walking, not elsewhere classified: Secondary | ICD-10-CM

## 2022-06-10 NOTE — Therapy (Signed)
OUTPATIENT PHYSICAL THERAPY TREATMENT NOTE   Patient Name: Marcus [name] MRN: [id number] DOB:April 15, 1961, 62 y.o., male Today's Date: 06/10/2022  END OF SESSION:   PT End of Session - 06/10/22 0805     Visit Number 2    Number of Visits 6    Date for PT Re-Evaluation 07/13/22    Authorization - Number of Visits 30    PT Start Time 0800    PT Stop Time 0838    PT Time Calculation (min) 38 min    Activity Tolerance Patient tolerated treatment well    Behavior During Therapy [facility] for tasks assessed/performed             Past Medical History:  Diagnosis Date   Diabetes mellitus without complication ([place])    Hypertension    Past Surgical History:  Procedure Laterality Date   ankle surgery Left 1980   TOTAL KNEE ARTHROPLASTY Left 08/18/2021   Procedure: LEFT TOTAL KNEE ARTHROPLASTY;  Surgeon: Leandrew Koyanagi, MD;  Location: [place];  Service: Orthopedics;  Laterality: Left;   Patient Active Problem List   Diagnosis Date Noted   Primary osteoarthritis of left knee 08/18/2021   Status post total left knee replacement 08/18/2021     THERAPY DIAG:  Radiculopathy, lumbar region  Muscle weakness (generalized)  Other symptoms and signs involving the musculoskeletal system  Difficulty in walking, not elsewhere classified   PCP: Durene Fruits, NP  REFERRING PROVIDER: Leandrew Koyanagi, MD  REFERRING DIAG: [phone] (ICD-10-CM) - Pain in left leg  Rationale for Evaluation and Treatment: Rehabilitation  EVAL THERAPY DIAG:  Radiculopathy, lumbar region - Plan: PT plan of care cert/re-cert  Muscle weakness (generalized) - Plan: PT plan of care cert/re-cert  Other symptoms and signs involving the musculoskeletal system - Plan: PT plan of care cert/re-cert  ONSET DATE: April 2023  SUBJECTIVE:                                                                                                                                                                                            SUBJECTIVE STATEMENT: Back is a little better, biggest issue is at night trying to get comfortable in the bed.  Rates pain up to 10/10 then.  PERTINENT HISTORY:  DM, HTN, smoker, Lt TKA  PAIN:  Are you having pain? Yes: NPRS scale: no pain, just soreness; up to 8-9/10 Pain location: Lt back, hip radiating to mid calf Pain description: soreness, shooting Aggravating factors: increased activity Relieving factors: repositioning, BC powder  PRECAUTIONS: None  WEIGHT BEARING RESTRICTIONS: No  FALLS:  Has patient fallen in last 6 months? No  LIVING  ENVIRONMENT: Lives with: sister Lives in: House/apartment Stairs: Yes: External: 6 steps; can reach both (denies difficulty with stairs at this time) Has following equipment at home: Gilford Rile - 2 wheeled  OCCUPATION: not working currently; actively looking for work  PLOF: Independent and Leisure: spend time with family  PATIENT GOALS: improve pain  NEXT MD VISIT: not scheduled, should be in about 6 weeks  OBJECTIVE:   PATIENT SURVEYS:  06/01/22 FOTO 59 (predicted 42)   SCREENING FOR RED FLAGS: Bowel or bladder incontinence: No Spinal tumors: No  COGNITION: Overall cognitive status: Within functional limits for tasks assessed     SENSATION: WFL  POSTURE: rounded shoulders, forward head, and increased lumbar lordosis  PALPATION: 06/01/22: Tenderness Rt QL and L4/5; piriformis tightness noted   LUMBAR ROM:   AROM eval  Flexion WNL  Extension Limited 25%  Right Quadrant WNL  Left Quadrant WNL   (Blank rows = not tested)  LOWER EXTREMITY MMT:    MMT Right eval Left eval  Hip flexion 5/5 5/5  Hip extension 5/5 4/5  Hip abduction 4/5 4/5  Knee flexion 5/5 5/5  Knee extension 5/5 5/5   (Blank rows = not tested)  LUMBAR SPECIAL TESTS:  06/01/22: Slump test: Negative  GAIT: 06/01/22: Comments: gait appears WNL; did have a couple LOB when performing ROM needing min A to correct  TODAY'S TREATMENT:                                                                                                                               DATE:  06/10/22 TherEx NuStep L5 x 8 min Bridge x 10 reps; 5 sec hold Lower trunk rotation 10 sec x 5 reps bil Figure-4 piriformis stretch supine 3x30 sec bil Sidelying hip circles x 10 reps CW/CCW each; performed bil Prone hip extension alternating bil x10 reps  Sit to/from stand without UE support x 10 reps  Standing hip extension 3 sec hold x 10 reps bil - min cues for posture   06/01/22 See HEP - pt performed trial reps PRN for comprehension   PATIENT EDUCATION:  Education details: HEP Person educated: Patient Education method: Consulting civil engineer, Demonstration, and Handouts Education comprehension: verbalized understanding, returned demonstration, and needs further education  HOME EXERCISE PROGRAM: Access Code: [email or web address] URL: [email or web address] Date: 06/01/2022 Prepared by: Faustino Congress  Exercises - Supine Bridge  - 2 x daily - 7 x weekly - 1 sets - 10 reps - 5 sec hold - Supine Lower Trunk Rotation  - 2 x daily - 7 x weekly - 1 sets - 5 reps - 10 sec hold - Supine Figure 4 Piriformis Stretch  - 2 x daily - 7 x weekly - 1 sets - 3 reps - 30 sec hold - Supine Piriformis Stretch with Foot on Ground  - 2 x daily - 7 x weekly - 1 sets - 3 reps - 30 sec hold - Sidelying Hip Circles  - 2 x  daily - 7 x weekly - 3 sets - 10 reps  ASSESSMENT:  CLINICAL IMPRESSION: Pt needed mod to max cues for HEP despite reporting daily compliance.  No changes made to HEP today.  Will continue to benefit from PT to address deficits listed.  OBJECTIVE IMPAIRMENTS: decreased balance, decreased mobility, decreased ROM, decreased strength, increased fascial restrictions, impaired flexibility, postural dysfunction, and pain.   ACTIVITY LIMITATIONS: carrying, lifting, bending, sitting, standing, transfers, and locomotion level  PARTICIPATION LIMITATIONS: meal prep, cleaning,  community activity, occupation, and yard work  PERSONAL FACTORS: 3+ comorbidities: DM, HTN, smoker, Lt TKA are also affecting patient's functional outcome.   REHAB POTENTIAL: Good  CLINICAL DECISION MAKING: Evolving/moderate complexity  EVALUATION COMPLEXITY: Moderate   GOALS: Goals reviewed with patient? Yes  SHORT TERM GOALS: Target date: 06/22/2022  Independent with initial HEP Goal status: INITIAL   LONG TERM GOALS: Target date: 07/13/2022  Independent with final HEP Goal status: INITIAL  2.  FOTO score improved to 69 Goal status: INITIAL  3.  Lt hip strength improved to 5/5 for improved function and mobilty Goal status: INIITAL  4.  Report pain < 3/10 with standing/walking > 1 hour for improved function Goal status: INITIAL   PLAN:  PT FREQUENCY: 1x/week  PT DURATION: 6 weeks  PLANNED INTERVENTIONS: Therapeutic exercises, Therapeutic activity, Neuromuscular re-education, Balance training, Gait training, Patient/Family education, Self Care, Joint mobilization, DME instructions, Dry Needling, Electrical stimulation, Spinal manipulation, Spinal mobilization, Cryotherapy, Moist heat, Taping, Traction, Ultrasound, Ionotophoresis 17m/ml Dexamethasone, Manual therapy, and Re-evaluation.  PLAN FOR NEXT SESSION: review HEP PRN, progress hip/core strengthening, manual/modalities PRN    SLaureen Abrahams PT, DPT 06/10/22 8:39 AM

## 2022-06-16 ENCOUNTER — Encounter: Payer: Commercial Managed Care - HMO | Admitting: Physical Therapy

## 2022-06-16 NOTE — Therapy (Incomplete)
OUTPATIENT PHYSICAL THERAPY TREATMENT NOTE   Patient Name: Marcus [name] MRN: [id number] DOB:09/05/1960, 62 y.o., male Today's Date: 06/16/2022  END OF SESSION:     Past Medical History:  Diagnosis Date   Diabetes mellitus without complication ([place])    Hypertension    Past Surgical History:  Procedure Laterality Date   ankle surgery Left 1980   TOTAL KNEE ARTHROPLASTY Left 08/18/2021   Procedure: LEFT TOTAL KNEE ARTHROPLASTY;  Surgeon: Leandrew Koyanagi, MD;  Location: [place];  Service: Orthopedics;  Laterality: Left;   Patient Active Problem List   Diagnosis Date Noted   Primary osteoarthritis of left knee 08/18/2021   Status post total left knee replacement 08/18/2021     THERAPY DIAG:  No diagnosis found.   PCP: Durene Fruits, NP  REFERRING PROVIDER: Leandrew Koyanagi, MD  REFERRING DIAG: [phone] (ICD-10-CM) - Pain in left leg  Rationale for Evaluation and Treatment: Rehabilitation  EVAL THERAPY DIAG:  Radiculopathy, lumbar region - Plan: PT plan of care cert/re-cert  Muscle weakness (generalized) - Plan: PT plan of care cert/re-cert  Other symptoms and signs involving the musculoskeletal system - Plan: PT plan of care cert/re-cert  ONSET DATE: April 2023  SUBJECTIVE:                                                                                                                                                                                           SUBJECTIVE STATEMENT:   PERTINENT HISTORY:  DM, HTN, smoker, Lt TKA  PAIN:  Are you having pain?  Pain location: Lt back, hip radiating to mid calf Pain description: soreness, shooting Aggravating factors: increased activity Relieving factors: repositioning, BC powder  PRECAUTIONS: None  WEIGHT BEARING RESTRICTIONS: No  FALLS:  Has patient fallen in last 6 months? No  LIVING ENVIRONMENT: Lives with: sister Lives in: House/apartment Stairs: Yes: External: 6 steps; can reach both (denies difficulty with  stairs at this time) Has following equipment at home: Gilford Rile - 2 wheeled  OCCUPATION: not working currently; actively looking for work  PLOF: Independent and Leisure: spend time with family  PATIENT GOALS: improve pain  NEXT MD VISIT: not scheduled, should be in about 6 weeks  OBJECTIVE:   PATIENT SURVEYS:  06/01/22 FOTO 59 (predicted 20)   SCREENING FOR RED FLAGS: Bowel or bladder incontinence: No Spinal tumors: No  COGNITION: Overall cognitive status: Within functional limits for tasks assessed     SENSATION: WFL  POSTURE: rounded shoulders, forward head, and increased lumbar lordosis  PALPATION: 06/01/22: Tenderness Rt QL and L4/5; piriformis tightness noted   LUMBAR ROM:   AROM eval  Flexion WNL  Extension Limited 25%  Right Quadrant WNL  Left Quadrant WNL   (Blank rows = not tested)  LOWER EXTREMITY MMT:    MMT Right eval Left eval  Hip flexion 5/5 5/5  Hip extension 5/5 4/5  Hip abduction 4/5 4/5  Knee flexion 5/5 5/5  Knee extension 5/5 5/5   (Blank rows = not tested)  LUMBAR SPECIAL TESTS:  06/01/22: Slump test: Negative  GAIT: 06/01/22: Comments: gait appears WNL; did have a couple LOB when performing ROM needing min A to correct  TODAY'S TREATMENT:                                                                                                                              DATE:  06/16/22 TherEx NuStep L5 x 8 min Bridge x 10 reps; 5 sec hold Lower trunk rotation 10 sec x 5 reps bil Figure-4 piriformis stretch supine 3x30 sec bil Sidelying hip circles x 10 reps CW/CCW each; performed bil Prone hip extension alternating bil x10 reps  Sit to/from stand without UE support x 10 reps  Standing hip extension 3 sec hold x 10 reps bil - min cues for posture   06/10/22 TherEx NuStep L5 x 8 min Bridge x 10 reps; 5 sec hold Lower trunk rotation 10 sec x 5 reps bil Figure-4 piriformis stretch supine 3x30 sec bil Sidelying hip circles x 10 reps CW/CCW  each; performed bil Prone hip extension alternating bil x10 reps  Sit to/from stand without UE support x 10 reps  Standing hip extension 3 sec hold x 10 reps bil - min cues for posture   06/01/22 See HEP - pt performed trial reps PRN for comprehension   PATIENT EDUCATION:  Education details: HEP Person educated: Patient Education method: Consulting civil engineer, Media planner, and Handouts Education comprehension: verbalized understanding, returned demonstration, and needs further education  HOME EXERCISE PROGRAM: Access Code: [email or web address] URL: [email or web address] Date: 06/01/2022 Prepared by: Faustino Congress  Exercises - Supine Bridge  - 2 x daily - 7 x weekly - 1 sets - 10 reps - 5 sec hold - Supine Lower Trunk Rotation  - 2 x daily - 7 x weekly - 1 sets - 5 reps - 10 sec hold - Supine Figure 4 Piriformis Stretch  - 2 x daily - 7 x weekly - 1 sets - 3 reps - 30 sec hold - Supine Piriformis Stretch with Foot on Ground  - 2 x daily - 7 x weekly - 1 sets - 3 reps - 30 sec hold - Sidelying Hip Circles  - 2 x daily - 7 x weekly - 3 sets - 10 reps  ASSESSMENT:  CLINICAL IMPRESSION: Pt needed mod to max cues for HEP despite reporting daily compliance.  No changes made to HEP today.  Will continue to benefit from PT to address deficits listed.  OBJECTIVE IMPAIRMENTS: decreased balance, decreased mobility, decreased ROM, decreased strength, increased fascial restrictions, impaired flexibility, postural dysfunction, and pain.  ACTIVITY LIMITATIONS: carrying, lifting, bending, sitting, standing, transfers, and locomotion level  PARTICIPATION LIMITATIONS: meal prep, cleaning, community activity, occupation, and yard work  PERSONAL FACTORS: 3+ comorbidities: DM, HTN, smoker, Lt TKA are also affecting patient's functional outcome.   REHAB POTENTIAL: Good  CLINICAL DECISION MAKING: Evolving/moderate complexity  EVALUATION COMPLEXITY: Moderate   GOALS: Goals reviewed with  patient? Yes  SHORT TERM GOALS: Target date: 06/22/2022  Independent with initial HEP Goal status: On-going 06/16/22   LONG TERM GOALS: Target date: 07/13/2022  Independent with final HEP Goal status: INITIAL  2.  FOTO score improved to 69 Goal status: INITIAL  3.  Lt hip strength improved to 5/5 for improved function and mobilty Goal status: INIITAL  4.  Report pain < 3/10 with standing/walking > 1 hour for improved function Goal status: INITIAL   PLAN:  PT FREQUENCY: 1x/week  PT DURATION: 6 weeks  PLANNED INTERVENTIONS: Therapeutic exercises, Therapeutic activity, Neuromuscular re-education, Balance training, Gait training, Patient/Family education, Self Care, Joint mobilization, DME instructions, Dry Needling, Electrical stimulation, Spinal manipulation, Spinal mobilization, Cryotherapy, Moist heat, Taping, Traction, Ultrasound, Ionotophoresis 77m/ml Dexamethasone, Manual therapy, and Re-evaluation.  PLAN FOR NEXT SESSION: review HEP PRN, progress hip/core strengthening, manual/modalities PRN    JKearney Hard PT, MPT 06/16/22 8:21 AM   06/16/22 8:21 AM

## 2022-06-22 ENCOUNTER — Encounter: Payer: Commercial Managed Care - HMO | Admitting: Physical Therapy

## 2022-06-24 ENCOUNTER — Ambulatory Visit: Payer: Commercial Managed Care - HMO | Admitting: Physical Therapy

## 2022-06-26 ENCOUNTER — Ambulatory Visit: Payer: Medicaid Other | Attending: Orthopaedic Surgery | Admitting: Physical Therapy

## 2022-06-26 DIAGNOSIS — M6281 Muscle weakness (generalized): Secondary | ICD-10-CM | POA: Diagnosis not present

## 2022-06-26 DIAGNOSIS — R29898 Other symptoms and signs involving the musculoskeletal system: Secondary | ICD-10-CM | POA: Diagnosis not present

## 2022-06-26 DIAGNOSIS — M25662 Stiffness of left knee, not elsewhere classified: Secondary | ICD-10-CM | POA: Insufficient documentation

## 2022-06-26 DIAGNOSIS — R600 Localized edema: Secondary | ICD-10-CM | POA: Insufficient documentation

## 2022-06-26 DIAGNOSIS — Z7689 Persons encountering health services in other specified circumstances: Secondary | ICD-10-CM | POA: Diagnosis not present

## 2022-06-26 DIAGNOSIS — M25562 Pain in left knee: Secondary | ICD-10-CM | POA: Insufficient documentation

## 2022-06-26 DIAGNOSIS — M5416 Radiculopathy, lumbar region: Secondary | ICD-10-CM | POA: Insufficient documentation

## 2022-06-26 DIAGNOSIS — R262 Difficulty in walking, not elsewhere classified: Secondary | ICD-10-CM | POA: Insufficient documentation

## 2022-06-26 DIAGNOSIS — Z419 Encounter for procedure for purposes other than remedying health state, unspecified: Secondary | ICD-10-CM | POA: Diagnosis not present

## 2022-06-26 NOTE — Therapy (Signed)
OUTPATIENT PHYSICAL THERAPY TREATMENT NOTE   Patient Name: Marcus [name] MRN: [id number] DOB:Aug 31, 1960, 62 y.o., male Today's Date: 06/26/2022  END OF SESSION:   PT End of Session - 06/26/22 0758     Visit Number 3    Number of Visits 6    Date for PT Re-Evaluation 07/13/22    Authorization - Number of Visits 30    PT Start Time 0800    PT Stop Time 0840    PT Time Calculation (min) 40 min    Activity Tolerance Patient tolerated treatment well    Behavior During Therapy [facility] for tasks assessed/performed             Past Medical History:  Diagnosis Date   Diabetes mellitus without complication ([place])    Hypertension    Past Surgical History:  Procedure Laterality Date   ankle surgery Left 1980   TOTAL KNEE ARTHROPLASTY Left 08/18/2021   Procedure: LEFT TOTAL KNEE ARTHROPLASTY;  Surgeon: Leandrew Koyanagi, MD;  Location: [place];  Service: Orthopedics;  Laterality: Left;   Patient Active Problem List   Diagnosis Date Noted   Primary osteoarthritis of left knee 08/18/2021   Status post total left knee replacement 08/18/2021     THERAPY DIAG:  Radiculopathy, lumbar region  Muscle weakness (generalized)  Other symptoms and signs involving the musculoskeletal system  Difficulty in walking, not elsewhere classified   PCP: Durene Fruits, NP  REFERRING PROVIDER: Leandrew Koyanagi, MD  REFERRING DIAG: [phone] (ICD-10-CM) - Pain in left leg  Rationale for Evaluation and Treatment: Rehabilitation  EVAL THERAPY DIAG:  Radiculopathy, lumbar region - Plan: PT plan of care cert/re-cert  Muscle weakness (generalized) - Plan: PT plan of care cert/re-cert  Other symptoms and signs involving the musculoskeletal system - Plan: PT plan of care cert/re-cert  ONSET DATE: April 2023  SUBJECTIVE:                                                                                                                                                                                            SUBJECTIVE STATEMENT: Pt states his back is a little stiff in the mornings but as he walks a lot it loosens up. Has been doing his stretches daily.   PERTINENT HISTORY:  DM, HTN, smoker, Lt TKA  PAIN:  Are you having pain? Yes: NPRS scale: no pain, just soreness; up to 8-9/10 Pain location: Lt back, hip radiating to mid calf Pain description: soreness, shooting Aggravating factors: increased activity Relieving factors: repositioning, BC powder  PRECAUTIONS: None  WEIGHT BEARING RESTRICTIONS: No  FALLS:  Has patient fallen in last 6 months?  No  LIVING ENVIRONMENT: Lives with: sister Lives in: House/apartment Stairs: Yes: External: 6 steps; can reach both (denies difficulty with stairs at this time) Has following equipment at home: Gilford Rile - 2 wheeled  OCCUPATION: not working currently; actively looking for work  PLOF: Independent and Leisure: spend time with family  PATIENT GOALS: improve pain  NEXT MD VISIT: not scheduled, should be in about 6 weeks  OBJECTIVE:   PATIENT SURVEYS:  06/01/22 FOTO 59 (predicted 59)   SCREENING FOR RED FLAGS: Bowel or bladder incontinence: No Spinal tumors: No  COGNITION: Overall cognitive status: Within functional limits for tasks assessed     SENSATION: WFL  POSTURE: rounded shoulders, forward head, and increased lumbar lordosis  PALPATION: 06/01/22: Tenderness Rt QL and L4/5; piriformis tightness noted   LUMBAR ROM:   AROM eval  Flexion WNL  Extension Limited 25%  Right Quadrant WNL  Left Quadrant WNL   (Blank rows = not tested)  LOWER EXTREMITY MMT:    MMT Right eval Left eval  Hip flexion 5/5 5/5  Hip extension 5/5 4/5  Hip abduction 4/5 4/5  Knee flexion 5/5 5/5  Knee extension 5/5 5/5   (Blank rows = not tested)  LUMBAR SPECIAL TESTS:  06/01/22: Slump test: Negative  GAIT: 06/01/22: Comments: gait appears WNL; did have a couple LOB when performing ROM needing min A to correct  TODAY'S TREATMENT:                                                                                                                               DATE:  06/26/22 TherEx NuStep L5 x 5 min Supine ITB stretch with strap 2x30 sec R&L LTR 10 sec x 5 reps SKTC x30 sec DKTC x30 sec PPT 10x3 sec PPT + marching R&L x10 Bridge x10; 5 sec hold Supine clamshell iso 10x3 sec  Manual therapy Grade II/III hip mobilizations laterally and inferiorly Hip LAD  06/10/22 TherEx NuStep L5 x 8 min Bridge x 10 reps; 5 sec hold Lower trunk rotation 10 sec x 5 reps bil Figure-4 piriformis stretch supine 3x30 sec bil Sidelying hip circles x 10 reps CW/CCW each; performed bil Prone hip extension alternating bil x10 reps  Sit to/from stand without UE support x 10 reps  Standing hip extension 3 sec hold x 10 reps bil - min cues for posture   06/01/22 See HEP - pt performed trial reps PRN for comprehension   PATIENT EDUCATION:  Education details: HEP Person educated: Patient Education method: Consulting civil engineer, Demonstration, and Handouts Education comprehension: verbalized understanding, returned demonstration, and needs further education  HOME EXERCISE PROGRAM: Access Code: [email or web address] URL: [email or web address] Date: 06/01/2022 Prepared by: Faustino Congress  Exercises - Supine Bridge  - 2 x daily - 7 x weekly - 1 sets - 10 reps - 5 sec hold - Supine Lower Trunk Rotation  - 2 x daily - 7 x weekly - 1 sets - 5 reps - 10 sec  hold - Supine Figure 4 Piriformis Stretch  - 2 x daily - 7 x weekly - 1 sets - 3 reps - 30 sec hold - Supine Piriformis Stretch with Foot on Ground  - 2 x daily - 7 x weekly - 1 sets - 3 reps - 30 sec hold - Sidelying Hip Circles  - 2 x daily - 7 x weekly - 3 sets - 10 reps  ASSESSMENT:  CLINICAL IMPRESSION: Pt with very hypomobile L femoracetabular joint -- provided joint mobs to address this session to get better stretch through glutes and lumbar parapsinals. Initiated core strengthening this  session. Continued hip strengthening. Pt notes "soreness" moved to R hip. Will check next session.   GOALS: Goals reviewed with patient? Yes  SHORT TERM GOALS: Target date: 06/22/2022  Independent with initial HEP Goal status: MET 06/26/22   LONG TERM GOALS: Target date: 07/13/2022  Independent with final HEP Goal status: INITIAL  2.  FOTO score improved to 69 Goal status: INITIAL  3.  Lt hip strength improved to 5/5 for improved function and mobilty Goal status: INIITAL  4.  Report pain < 3/10 with standing/walking > 1 hour for improved function Goal status: INITIAL   PLAN:  PT FREQUENCY: 1x/week  PT DURATION: 6 weeks  PLANNED INTERVENTIONS: Therapeutic exercises, Therapeutic activity, Neuromuscular re-education, Balance training, Gait training, Patient/Family education, Self Care, Joint mobilization, DME instructions, Dry Needling, Electrical stimulation, Spinal manipulation, Spinal mobilization, Cryotherapy, Moist heat, Taping, Traction, Ultrasound, Ionotophoresis '4mg'$ /ml Dexamethasone, Manual therapy, and Re-evaluation.  PLAN FOR NEXT SESSION: review HEP PRN, progress hip/core strengthening, manual/modalities PRN    [facility] April Ma L Alasdair Kleve, PT 06/26/22 7:58 AM

## 2022-06-30 ENCOUNTER — Ambulatory Visit: Payer: Medicaid Other

## 2022-06-30 DIAGNOSIS — M5416 Radiculopathy, lumbar region: Secondary | ICD-10-CM

## 2022-06-30 DIAGNOSIS — R29898 Other symptoms and signs involving the musculoskeletal system: Secondary | ICD-10-CM

## 2022-06-30 DIAGNOSIS — M6281 Muscle weakness (generalized): Secondary | ICD-10-CM

## 2022-06-30 DIAGNOSIS — Z7689 Persons encountering health services in other specified circumstances: Secondary | ICD-10-CM | POA: Diagnosis not present

## 2022-06-30 DIAGNOSIS — R262 Difficulty in walking, not elsewhere classified: Secondary | ICD-10-CM | POA: Diagnosis not present

## 2022-06-30 DIAGNOSIS — M25562 Pain in left knee: Secondary | ICD-10-CM | POA: Diagnosis not present

## 2022-06-30 DIAGNOSIS — M25662 Stiffness of left knee, not elsewhere classified: Secondary | ICD-10-CM | POA: Diagnosis not present

## 2022-06-30 DIAGNOSIS — R600 Localized edema: Secondary | ICD-10-CM | POA: Diagnosis not present

## 2022-06-30 NOTE — Therapy (Signed)
OUTPATIENT PHYSICAL THERAPY TREATMENT NOTE   Patient Name: Marcus [name] MRN: [id number] DOB:05-07-1960, 62 y.o., male Today's Date: 06/30/2022  END OF SESSION:   PT End of Session - 06/30/22 0722     Visit Number 3    Number of Visits 6    Date for PT Re-Evaluation 07/13/22    Authorization Type [employer]    PT Start Time [phone]    PT Stop Time 0759    PT Time Calculation (min) 42 min    Activity Tolerance Patient tolerated treatment well    Behavior During Therapy [facility] for tasks assessed/performed              Past Medical History:  Diagnosis Date   Diabetes mellitus without complication ([place])    Hypertension    Past Surgical History:  Procedure Laterality Date   ankle surgery Left 1980   TOTAL KNEE ARTHROPLASTY Left 08/18/2021   Procedure: LEFT TOTAL KNEE ARTHROPLASTY;  Surgeon: Leandrew Koyanagi, MD;  Location: [place];  Service: Orthopedics;  Laterality: Left;   Patient Active Problem List   Diagnosis Date Noted   Primary osteoarthritis of left knee 08/18/2021   Status post total left knee replacement 08/18/2021     THERAPY DIAG:  Radiculopathy, lumbar region  Muscle weakness (generalized)  Other symptoms and signs involving the musculoskeletal system   PCP: Durene Fruits, NP  REFERRING PROVIDER: Leandrew Koyanagi, MD  REFERRING DIAG: [phone] (ICD-10-CM) - Pain in left leg  Rationale for Evaluation and Treatment: Rehabilitation  EVAL THERAPY DIAG:  Radiculopathy, lumbar region - Plan: PT plan of care cert/re-cert  Muscle weakness (generalized) - Plan: PT plan of care cert/re-cert  Other symptoms and signs involving the musculoskeletal system - Plan: PT plan of care cert/re-cert  ONSET DATE: April 2023  SUBJECTIVE:                                                                                                                                                                                           SUBJECTIVE STATEMENT: Pt's report is the same  with his back is a little stiff in the mornings but as he walks a lot it loosens up. Pt reports no issue with the r hip today.  PERTINENT HISTORY:  DM, HTN, smoker, Lt TKA  PAIN:  Are you having pain? Yes: NPRS scale: no pain, just soreness; up to 8-9/10. 06/30/22=6/10 Pain location: Lt back, hip radiating to mid calf Pain description: soreness, shooting Aggravating factors: increased activity Relieving factors: repositioning, BC powder  PRECAUTIONS: None  WEIGHT BEARING RESTRICTIONS: No  FALLS:  Has patient fallen in last 6  months? No  LIVING ENVIRONMENT: Lives with: sister Lives in: House/apartment Stairs: Yes: External: 6 steps; can reach both (denies difficulty with stairs at this time) Has following equipment at home: Gilford Rile - 2 wheeled  OCCUPATION: not working currently; actively looking for work  PLOF: Independent and Leisure: spend time with family  PATIENT GOALS: improve pain  NEXT MD VISIT: not scheduled, should be in about 6 weeks  OBJECTIVE:   PATIENT SURVEYS:  06/01/22 FOTO 59 (predicted 71)   SCREENING FOR RED FLAGS: Bowel or bladder incontinence: No Spinal tumors: No  COGNITION: Overall cognitive status: Within functional limits for tasks assessed     SENSATION: WFL  POSTURE: rounded shoulders, forward head, and increased lumbar lordosis  PALPATION: 06/01/22: Tenderness Rt QL and L4/5; piriformis tightness noted   LUMBAR ROM:   AROM eval  Flexion WNL  Extension Limited 25%  Right Quadrant WNL  Left Quadrant WNL   (Blank rows = not tested)  LOWER EXTREMITY MMT:    MMT Right eval Left eval  Hip flexion 5/5 5/5  Hip extension 5/5 4/5  Hip abduction 4/5 4/5  Knee flexion 5/5 5/5  Knee extension 5/5 5/5   (Blank rows = not tested)  LUMBAR SPECIAL TESTS:  06/01/22: Slump test: Negative  GAIT: 06/01/22: Comments: gait appears WNL; did have a couple LOB when performing ROM needing min A to correct  TODAY'S TREATMENT:                                                                                                                                [facility] Adult PT Treatment:                                                DATE: 06/30/22 TherEx NuStep L5 x 5 min Seated hamstring stretch x2 30" Seated trunk flexion forward and laterally c swiss ball Seated piriformis stretch x2 30 " PPT 10x3 sec PPT + marching R&L x10 Bridge x10; 5 sec hold Supine clamshell BluTB 10x sec 5"  Manual therapy Grade II/III hip mobilizations laterally and inferiorly Hip LAD  Self Care: Use of pillow between knees with   06/26/22 TherEx NuStep L5 x 5 min Supine ITB stretch with strap 2x30 sec R&L LTR 10 sec x 5 reps SKTC x30 sec DKTC x30 sec PPT 10x3 sec PPT + marching R&L x10 Bridge x10; 5 sec hold Supine clamshell iso 10x3 sec  Manual therapy Grade II/III hip mobilizations laterally and inferiorly Hip LAD  06/10/22 TherEx NuStep L5 x 8 min Bridge x 10 reps; 5 sec hold Lower trunk rotation 10 sec x 5 reps bil Figure-4 piriformis stretch supine 3x30 sec bil Sidelying hip circles x 10 reps CW/CCW each; performed bil Prone hip extension alternating bil x10 reps  Sit to/from stand without UE support  x 10 reps  Standing hip extension 3 sec hold x 10 reps bil - min cues for posture   06/01/22 See HEP - pt performed trial reps PRN for comprehension   PATIENT EDUCATION:  Education details: HEP Person educated: Patient Education method: Consulting civil engineer, Demonstration, and Handouts Education comprehension: verbalized understanding, returned demonstration, and needs further education  HOME EXERCISE PROGRAM: Access Code: [email or web address] URL: [email or web address] Date: 06/01/2022 Prepared by: Faustino Congress  Exercises - Supine Bridge  - 2 x daily - 7 x weekly - 1 sets - 10 reps - 5 sec hold - Supine Lower Trunk Rotation  - 2 x daily - 7 x weekly - 1 sets - 5 reps - 10 sec hold - Supine Figure 4 Piriformis Stretch  - 2 x daily - 7 x  weekly - 1 sets - 3 reps - 30 sec hold - Supine Piriformis Stretch with Foot on Ground  - 2 x daily - 7 x weekly - 1 sets - 3 reps - 30 sec hold - Sidelying Hip Circles  - 2 x daily - 7 x weekly - 3 sets - 10 reps  ASSESSMENT:  CLINICAL IMPRESSION: Pt denies R hip soreness today. Completed L hip mobs. Pt is very TTP around the PSIS. PT was completed for lumbopelvic/LE flexibility and strengthening. Decreased core strength noted with therex. Verbal cueing needed for PPT. Recommended use of pillow between knees when sleeping. Pt tolerated PT today without adverse effects.  will continue to benefit from skilled PT to address impairments for improved function with less pain.  Goals reviewed with patient? Yes  SHORT TERM GOALS: Target date: 06/22/2022  Independent with initial HEP Goal status: MET 06/26/22   LONG TERM GOALS: Target date: 07/13/2022  Independent with final HEP Goal status: INITIAL  2.  FOTO score improved to 69 Goal status: INITIAL  3.  Lt hip strength improved to 5/5 for improved function and mobilty Goal status: INIITAL  4.  Report pain < 3/10 with standing/walking > 1 hour for improved function Goal status: INITIAL   PLAN:  PT FREQUENCY: 1x/week  PT DURATION: 6 weeks  PLANNED INTERVENTIONS: Therapeutic exercises, Therapeutic activity, Neuromuscular re-education, Balance training, Gait training, Patient/Family education, Self Care, Joint mobilization, DME instructions, Dry Needling, Electrical stimulation, Spinal manipulation, Spinal mobilization, Cryotherapy, Moist heat, Taping, Traction, Ultrasound, Ionotophoresis '4mg'$ /ml Dexamethasone, Manual therapy, and Re-evaluation.  PLAN FOR NEXT SESSION: review HEP PRN, progress hip/core strengthening, manual/modalities PRN  .Shakthi Scipio MS, PT 06/30/22 8:02 AM

## 2022-07-06 ENCOUNTER — Encounter: Payer: Commercial Managed Care - HMO | Admitting: Physical Therapy

## 2022-07-07 NOTE — Therapy (Signed)
OUTPATIENT PHYSICAL THERAPY TREATMENT NOTE   Patient Name: Marcus [name] MRN: [id number] DOB:1960/11/24, 62 y.o., male Today's Date: 06/30/2022  END OF SESSION:   PT End of Session - 06/30/22 0722     Visit Number 3    Number of Visits 6    Date for PT Re-Evaluation 07/13/22    Authorization Type [employer] [place] [employer]    PT Start Time [phone]    PT Stop Time 0759    PT Time Calculation (min) 42 min    Activity Tolerance Patient tolerated treatment well    Behavior During Therapy [facility] for tasks assessed/performed              Past Medical History:  Diagnosis Date   Diabetes mellitus without complication ([place])    Hypertension    Past Surgical History:  Procedure Laterality Date   ankle surgery Left 1980   TOTAL KNEE ARTHROPLASTY Left 08/18/2021   Procedure: LEFT TOTAL KNEE ARTHROPLASTY;  Surgeon: Leandrew Koyanagi, MD;  Location: [place];  Service: Orthopedics;  Laterality: Left;   Patient Active Problem List   Diagnosis Date Noted   Primary osteoarthritis of left knee 08/18/2021   Status post total left knee replacement 08/18/2021     THERAPY DIAG:  Radiculopathy, lumbar region  Muscle weakness (generalized)  Other symptoms and signs involving the musculoskeletal system   PCP: Durene Fruits, NP  REFERRING PROVIDER: Leandrew Koyanagi, MD  REFERRING DIAG: [phone] (ICD-10-CM) - Pain in left leg  Rationale for Evaluation and Treatment: Rehabilitation  EVAL THERAPY DIAG:  Radiculopathy, lumbar region - Plan: PT plan of care cert/re-cert  Muscle weakness (generalized) - Plan: PT plan of care cert/re-cert  Other symptoms and signs involving the musculoskeletal system - Plan: PT plan of care cert/re-cert  ONSET DATE: April 2023  SUBJECTIVE:                                                                                                                                                                                           SUBJECTIVE STATEMENT: Pt's report is the same  with his back is a little stiff in the mornings but as he walks a lot it loosens up. Pt reports no issue with the r hip today.  PERTINENT HISTORY:  DM, HTN, smoker, Lt TKA  PAIN:  Are you having pain? Yes: NPRS scale: no pain, just soreness; up to 8-9/10. 06/30/22=6/10 Pain location: Lt back, hip radiating to mid calf Pain description: soreness, shooting Aggravating factors: increased activity Relieving factors: repositioning, BC powder  PRECAUTIONS: None  WEIGHT BEARING RESTRICTIONS: No  FALLS:  Has patient fallen in last 6  months? No  LIVING ENVIRONMENT: Lives with: sister Lives in: House/apartment Stairs: Yes: External: 6 steps; can reach both (denies difficulty with stairs at this time) Has following equipment at home: Gilford Rile - 2 wheeled  OCCUPATION: not working currently; actively looking for work  PLOF: Independent and Leisure: spend time with family  PATIENT GOALS: improve pain  NEXT MD VISIT: not scheduled, should be in about 6 weeks  OBJECTIVE:   PATIENT SURVEYS:  06/01/22 FOTO 59 (predicted 21)   SCREENING FOR RED FLAGS: Bowel or bladder incontinence: No Spinal tumors: No  COGNITION: Overall cognitive status: Within functional limits for tasks assessed     SENSATION: WFL  POSTURE: rounded shoulders, forward head, and increased lumbar lordosis  PALPATION: 06/01/22: Tenderness Rt QL and L4/5; piriformis tightness noted   LUMBAR ROM:   AROM eval  Flexion WNL  Extension Limited 25%  Right Quadrant WNL  Left Quadrant WNL   (Blank rows = not tested)  LOWER EXTREMITY MMT:    MMT Right eval Left eval  Hip flexion 5/5 5/5  Hip extension 5/5 4/5  Hip abduction 4/5 4/5  Knee flexion 5/5 5/5  Knee extension 5/5 5/5   (Blank rows = not tested)  LUMBAR SPECIAL TESTS:  06/01/22: Slump test: Negative  GAIT: 06/01/22: Comments: gait appears WNL; did have a couple LOB when performing ROM needing min A to correct  TODAY'S TREATMENT:  [facility] Adult PT  Treatment:                                                DATE: 07/08/22 Therapeutic Exercise: *** Manual Therapy: *** Neuromuscular re-ed: *** Therapeutic Activity: *** Modalities: *** Self Care: ***                                                                                                                            [facility] Adult PT Treatment:                                                DATE: 06/30/22 TherEx NuStep L5 x 5 min Seated hamstring stretch x2 30" Seated trunk flexion forward and laterally c swiss ball Seated piriformis stretch x2 30 " PPT 10x3 sec PPT + marching R&L x10 Bridge x10; 5 sec hold Supine clamshell BluTB 10x sec 5"  Manual therapy Grade II/III hip mobilizations laterally and inferiorly Hip LAD  Self Care: Use of pillow between knees with   06/26/22 TherEx NuStep L5 x 5 min Supine ITB stretch with strap 2x30 sec R&L LTR 10 sec x 5 reps SKTC x30 sec DKTC x30 sec PPT 10x3 sec PPT + marching R&L x10 Bridge x10; 5 sec hold Supine clamshell iso 10x3  sec  Manual therapy Grade II/III hip mobilizations laterally and inferiorly Hip LAD  06/10/22 TherEx NuStep L5 x 8 min Bridge x 10 reps; 5 sec hold Lower trunk rotation 10 sec x 5 reps bil Figure-4 piriformis stretch supine 3x30 sec bil Sidelying hip circles x 10 reps CW/CCW each; performed bil Prone hip extension alternating bil x10 reps  Sit to/from stand without UE support x 10 reps  Standing hip extension 3 sec hold x 10 reps bil - min cues for posture   06/01/22 See HEP - pt performed trial reps PRN for comprehension   PATIENT EDUCATION:  Education details: HEP Person educated: Patient Education method: Consulting civil engineer, Media planner, and Handouts Education comprehension: verbalized understanding, returned demonstration, and needs further education  HOME EXERCISE PROGRAM: Access Code: [email or web address] URL: [email or web address] Date: 06/01/2022 Prepared by: Faustino Congress  Exercises - Supine Bridge  - 2 x daily - 7 x weekly - 1 sets - 10 reps - 5 sec hold - Supine Lower Trunk Rotation  - 2 x daily - 7 x weekly - 1 sets - 5 reps - 10 sec hold - Supine Figure 4 Piriformis Stretch  - 2 x daily - 7 x weekly - 1 sets - 3 reps - 30 sec hold - Supine Piriformis Stretch with Foot on Ground  - 2 x daily - 7 x weekly - 1 sets - 3 reps - 30 sec hold - Sidelying Hip Circles  - 2 x daily - 7 x weekly - 3 sets - 10 reps  ASSESSMENT:  CLINICAL IMPRESSION: Pt denies R hip soreness today. Completed L hip mobs. Pt is very TTP around the PSIS. PT was completed for lumbopelvic/LE flexibility and strengthening. Decreased core strength noted with therex. Verbal cueing needed for PPT. Recommended use of pillow between knees when sleeping. Pt tolerated PT today without adverse effects.  will continue to benefit from skilled PT to address impairments for improved function with less pain.  Goals reviewed with patient? Yes  SHORT TERM GOALS: Target date: 06/22/2022  Independent with initial HEP Goal status: MET 06/26/22   LONG TERM GOALS: Target date: 07/13/2022  Independent with final HEP Goal status: INITIAL  2.  FOTO score improved to 69 Goal status: INITIAL  3.  Lt hip strength improved to 5/5 for improved function and mobilty Goal status: INIITAL  4.  Report pain < 3/10 with standing/walking > 1 hour for improved function Goal status: INITIAL   PLAN:  PT FREQUENCY: 1x/week  PT DURATION: 6 weeks  PLANNED INTERVENTIONS: Therapeutic exercises, Therapeutic activity, Neuromuscular re-education, Balance training, Gait training, Patient/Family education, Self Care, Joint mobilization, DME instructions, Dry Needling, Electrical stimulation, Spinal manipulation, Spinal mobilization, Cryotherapy, Moist heat, Taping, Traction, Ultrasound, Ionotophoresis '4mg'$ /ml Dexamethasone, Manual therapy, and Re-evaluation.  PLAN FOR NEXT SESSION: review HEP PRN, progress hip/core  strengthening, manual/modalities PRN  .Latha Staunton MS, PT 06/30/22 8:02 AM

## 2022-07-08 ENCOUNTER — Ambulatory Visit: Payer: Medicaid Other

## 2022-07-08 DIAGNOSIS — R29898 Other symptoms and signs involving the musculoskeletal system: Secondary | ICD-10-CM | POA: Diagnosis not present

## 2022-07-08 DIAGNOSIS — M5416 Radiculopathy, lumbar region: Secondary | ICD-10-CM

## 2022-07-08 DIAGNOSIS — R262 Difficulty in walking, not elsewhere classified: Secondary | ICD-10-CM | POA: Diagnosis not present

## 2022-07-08 DIAGNOSIS — M25562 Pain in left knee: Secondary | ICD-10-CM | POA: Diagnosis not present

## 2022-07-08 DIAGNOSIS — M6281 Muscle weakness (generalized): Secondary | ICD-10-CM | POA: Diagnosis not present

## 2022-07-08 DIAGNOSIS — M25662 Stiffness of left knee, not elsewhere classified: Secondary | ICD-10-CM | POA: Diagnosis not present

## 2022-07-08 DIAGNOSIS — Z7689 Persons encountering health services in other specified circumstances: Secondary | ICD-10-CM | POA: Diagnosis not present

## 2022-07-08 DIAGNOSIS — R600 Localized edema: Secondary | ICD-10-CM | POA: Diagnosis not present

## 2022-07-15 ENCOUNTER — Telehealth: Payer: Self-pay

## 2022-07-15 ENCOUNTER — Ambulatory Visit: Payer: Medicaid Other

## 2022-07-15 NOTE — Telephone Encounter (Signed)
LVM re; no show appt. Reminded pt of his next appt on 3/27.

## 2022-07-22 ENCOUNTER — Ambulatory Visit: Payer: Medicaid Other | Admitting: Physical Therapy

## 2022-07-22 ENCOUNTER — Encounter: Payer: Self-pay | Admitting: Physical Therapy

## 2022-07-22 DIAGNOSIS — M25562 Pain in left knee: Secondary | ICD-10-CM | POA: Diagnosis not present

## 2022-07-22 DIAGNOSIS — R29898 Other symptoms and signs involving the musculoskeletal system: Secondary | ICD-10-CM

## 2022-07-22 DIAGNOSIS — M6281 Muscle weakness (generalized): Secondary | ICD-10-CM

## 2022-07-22 DIAGNOSIS — R262 Difficulty in walking, not elsewhere classified: Secondary | ICD-10-CM | POA: Diagnosis not present

## 2022-07-22 DIAGNOSIS — R600 Localized edema: Secondary | ICD-10-CM | POA: Diagnosis not present

## 2022-07-22 DIAGNOSIS — M5416 Radiculopathy, lumbar region: Secondary | ICD-10-CM | POA: Diagnosis not present

## 2022-07-22 DIAGNOSIS — M25662 Stiffness of left knee, not elsewhere classified: Secondary | ICD-10-CM

## 2022-07-22 NOTE — Therapy (Signed)
OUTPATIENT PHYSICAL THERAPY TREATMENT NOTE AND DISCHARGE  PHYSICAL THERAPY DISCHARGE SUMMARY  Visits from Start of Care: 5  Current functional level related to goals / functional outcomes: See below   Remaining deficits: See below   Education / Equipment: See below   Patient agrees to discharge. Patient goals were met. Patient is being discharged due to meeting the stated rehab goals.   Patient Name: Marcus [name] MRN: [id number] DOB:1960/10/02, 62 y.o., male Today's Date: 07/22/2022  END OF SESSION:   PT End of Session - 07/22/22 0803     Visit Number 5    Number of Visits 6    Date for PT Re-Evaluation 07/13/22    Authorization Type [employer] [place] [employer] CONNECT    PT Start Time 0803    PT Stop Time 0845    PT Time Calculation (min) 42 min    Activity Tolerance Patient tolerated treatment well    Behavior During Therapy [facility] for tasks assessed/performed              Past Medical History:  Diagnosis Date   Diabetes mellitus without complication ([place])    Hypertension    Past Surgical History:  Procedure Laterality Date   ankle surgery Left 1980   TOTAL KNEE ARTHROPLASTY Left 08/18/2021   Procedure: LEFT TOTAL KNEE ARTHROPLASTY;  Surgeon: Leandrew Koyanagi, MD;  Location: [place];  Service: Orthopedics;  Laterality: Left;   Patient Active Problem List   Diagnosis Date Noted   Primary osteoarthritis of left knee 08/18/2021   Status post total left knee replacement 08/18/2021     THERAPY DIAG:  Radiculopathy, lumbar region  Muscle weakness (generalized)  Other symptoms and signs involving the musculoskeletal system  Difficulty in walking, not elsewhere classified  Left knee pain, unspecified chronicity  Stiffness of left knee, not elsewhere classified  Localized edema   PCP: Durene Fruits, NP  REFERRING PROVIDER: Leandrew Koyanagi, MD  REFERRING DIAG: [phone] (ICD-10-CM) - Pain in left leg  Rationale for Evaluation and Treatment: Rehabilitation  ONSET DATE:  April 2023  SUBJECTIVE:                                                                                                                                                                                           SUBJECTIVE STATEMENT: Pt states he woke up less sore this morning. Pt feels ready for PT d/c.   PERTINENT HISTORY:  DM, HTN, smoker, Lt TKA  PAIN:  Are you having pain? Yes: NPRS scale: no pain currently, just soreness; up to 6/10. Pain location: Lt back, hip radiating to mid calf Pain description: soreness, shooting Aggravating  factors: increased activity Relieving factors: repositioning, BC powder  PRECAUTIONS: None  WEIGHT BEARING RESTRICTIONS: No  FALLS:  Has patient fallen in last 6 months? No  LIVING ENVIRONMENT: Lives with: sister Lives in: House/apartment Stairs: Yes: External: 6 steps; can reach both (denies difficulty with stairs at this time) Has following equipment at home: Gilford Rile - 2 wheeled  OCCUPATION: not working currently; actively looking for work  PLOF: Independent and Leisure: spend time with family  PATIENT GOALS: improve pain  NEXT MD VISIT: not scheduled, should be in about 6 weeks  OBJECTIVE:   PATIENT SURVEYS:  06/01/22 FOTO 59 (predicted 73)  07/22/22 FOTO 73  SCREENING FOR RED FLAGS: Bowel or bladder incontinence: No Spinal tumors: No  COGNITION: Overall cognitive status: Within functional limits for tasks assessed     SENSATION: WFL  POSTURE: rounded shoulders, forward head, and increased lumbar lordosis  PALPATION: 06/01/22: Tenderness Rt QL and L4/5; piriformis tightness noted   LUMBAR ROM:   AROM eval  Flexion WNL  Extension Limited 25%  Right Quadrant WNL  Left Quadrant WNL   (Blank rows = not tested)  LOWER EXTREMITY MMT:    MMT Right eval Left eval R/L 3/27  Hip flexion 5/5 5/5 5/5  Hip extension 5/5 4/5 5/5  Hip abduction 4/5 4/5 5/5  Knee flexion 5/5 5/5   Knee extension 5/5 5/5    (Blank rows = not  tested)  LUMBAR SPECIAL TESTS:  06/01/22: Slump test: Negative  GAIT: 06/01/22: Comments: gait appears WNL; did have a couple LOB when performing ROM needing min A to correct  TODAY'S TREATMENT:  [facility] Adult PT Treatment:                                                DATE: 07/22/22 Therapeutic Exercise: Nustep L6 x 5 min Hamstring stretch x 30 sec Bridge + marching x10 S/L hip circles red TB x10 Ab set + alternating leg lifts x10 Self Care: Discussed HEP progressions with band colors (red-> green -> blue),    [facility] Adult PT Treatment:                                                DATE: 07/08/22 Therapeutic Exercise: NuStep L5 x 5 min Seated hamstring stretch x1 30" Seated trunk flexion forward and laterally c swiss ball LTR 10 sec x 5 reps SKTC x30 sec DKTC x30 sec PPT 10x3 sec PPT + marching R&L x10 Bridge x10; 5 sec hold S/L hip circles fwd/bwd Updated HEP                                                                                                                       [facility] Adult PT  Treatment:                                                DATE: 06/30/22 TherEx NuStep L5 x 5 min Seated hamstring stretch x2 30" Seated trunk flexion forward and laterally c swiss ball Seated piriformis stretch x2 30 " PPT 10x3 sec PPT + marching R&L x10 Bridge x10; 5 sec hold Supine clamshell BluTB 10x sec 5" Manual therapy Grade II/III hip mobilizations laterally and inferiorly Hip LAD  Self Care: Use of pillow between knees with   06/26/22 TherEx NuStep L5 x 5 min Supine ITB stretch with strap 2x30 sec R&L LTR 10 sec x 5 reps SKTC x30 sec DKTC x30 sec PPT 10x3 sec PPT + marching R&L x10 Bridge x10; 5 sec hold Supine clamshell iso 10x3 sec  Manual therapy Grade II/III hip mobilizations laterally and inferiorly Hip LAD  06/10/22 TherEx NuStep L5 x 8 min Bridge x 10 reps; 5 sec hold Lower trunk rotation 10 sec x 5 reps bil Figure-4 piriformis stretch supine 3x30 sec  bil Sidelying hip circles x 10 reps CW/CCW each; performed bil Prone hip extension alternating bil x10 reps  Sit to/from stand without UE support x 10 reps  Standing hip extension 3 sec hold x 10 reps bil - min cues for posture   PATIENT EDUCATION:  Education details: HEP Person educated: Patient Education method: Consulting civil engineer, Media planner, and Handouts Education comprehension: verbalized understanding, returned demonstration, and needs further education  HOME EXERCISE PROGRAM: Access Code: [email or web address] URL: [email or web address] Date: 07/08/2022 Prepared by: Gar Ponto  Exercises - Supine Bridge  - 2 x daily - 7 x weekly - 1 sets - 10 reps - 5 sec hold - Supine Lower Trunk Rotation  - 2 x daily - 7 x weekly - 1 sets - 5 reps - 10 sec hold - Sidelying Hip Circles  - 2 x daily - 7 x weekly - 3 sets - 10 reps - Supine ITB Stretch with Strap  - 1 x daily - 7 x weekly - 2 sets - 30 sec hold - Supine Posterior Pelvic Tilt  - 1 x daily - 7 x weekly - 2 sets - 10 reps - Supine March with Posterior Pelvic Tilt  - 1 x daily - 7 x weekly - 2 sets - 10 reps - Supine Double Knee to Chest  - 1 x daily - 7 x weekly - 1 sets - 3 reps - 30 hold  ASSESSMENT:  CLINICAL IMPRESSION: Treatment session focused on finalizing pt's d/c. Pt feels he is doing well and is ready for PT d/c at this time. Re-checked pt's goals and he has met all of his LTGs.  Goals reviewed with patient? Yes  SHORT TERM GOALS: Target date: 06/22/2022  Independent with initial HEP Goal status: MET 06/26/22   LONG TERM GOALS: Target date: 07/13/2022  Independent with final HEP Goal status: MET  2.  FOTO score improved to 69 Goal status: MET   3.  Lt hip strength improved to 5/5 for improved function and mobilty Goal status: MET  4.  Report pain < 3/10 with standing/walking > 1 hour for improved function 07/22/22: "I walked to the store 30 minutes away and it started out a little sore but got better. My pain  was less than a 3." 07/22/22 Goal status: MET   PLAN:  PT FREQUENCY: 1x/week  PT DURATION: 6 weeks  PLANNED INTERVENTIONS: Therapeutic exercises, Therapeutic activity, Neuromuscular re-education, Balance training, Gait training, Patient/Family education, Self Care, Joint mobilization, DME instructions, Dry Needling, Electrical stimulation, Spinal manipulation, Spinal mobilization, Cryotherapy, Moist heat, Taping, Traction, Ultrasound, Ionotophoresis 4mg /ml Dexamethasone, Manual therapy, and Re-evaluation.  PLAN FOR NEXT SESSION: review HEP PRN, progress hip/core strengthening, manual/modalities PRN  [facility] April Ma L Masoud Nyce, PT 07/22/22 8:03 AM

## 2022-07-27 DIAGNOSIS — Z419 Encounter for procedure for purposes other than remedying health state, unspecified: Secondary | ICD-10-CM | POA: Diagnosis not present

## 2022-08-03 DIAGNOSIS — H5213 Myopia, bilateral: Secondary | ICD-10-CM | POA: Diagnosis not present

## 2022-08-14 NOTE — Progress Notes (Unsigned)
Patient ID: Marcus [name], male    DOB: [birth year]  MRN: [id number]  CC: Chronic Care Management   Subjective: Marcus [name] is a 62 y.o. male who presents for chronic care management.   His concerns today include:  HTN - Amlodipine, Lisinopril DM - Metformin HLD - Atorvastatin  Possible home aid    Patient Active Problem List   Diagnosis Date Noted   Primary osteoarthritis of left knee 08/18/2021   Status post total left knee replacement 08/18/2021     Current Outpatient Medications on File Prior to Visit  Medication Sig Dispense Refill   albuterol (VENTOLIN HFA) 108 (90 Base) MCG/ACT inhaler Inhale 2 puffs into the lungs every 6 (six) hours as needed. 18 g 5   amLODipine (NORVASC) 5 MG tablet Take 1 tablet (5 mg total) by mouth daily. 30 tablet 2   aspirin EC 81 MG tablet Take 1 tablet (81 mg total) by mouth 2 (two) times daily. To be taken after surgery to prevent blood clots 84 tablet 0   atorvastatin (LIPITOR) 20 MG tablet Take 1 tablet (20 mg total) by mouth daily. 30 tablet 2   lisinopril (ZESTRIL) 10 MG tablet Take 1 tablet (10 mg total) by mouth daily. 30 tablet 2   metFORMIN (GLUCOPHAGE) 1000 MG tablet Take 1 tablet (1,000 mg total) by mouth 2 (two) times daily with a meal. 60 tablet 2   methocarbamol (ROBAXIN) 500 MG tablet Take 1 tablet (500 mg total) by mouth 2 (two) times daily as needed. 20 tablet 2   methocarbamol (ROBAXIN-750) 750 MG tablet Take 1 tablet (750 mg total) by mouth 2 (two) times daily as needed for muscle spasms. 20 tablet 2   ondansetron (ZOFRAN) 4 MG tablet Take 1 tablet (4 mg total) by mouth every 8 (eight) hours as needed for nausea or vomiting. 40 tablet 0   predniSONE (STERAPRED UNI-PAK 21 TAB) 10 MG (21) TBPK tablet Take as directed 21 tablet 0   traMADol (ULTRAM) 50 MG tablet Take 1-2 tablets (50-100 mg total) by mouth daily as needed. 20 tablet 0   umeclidinium-vilanterol (ANORO ELLIPTA) 62.5-25 MCG/ACT AEPB Inhale 1 puff into the lungs  daily. 1 each 5   No current facility-administered medications on file prior to visit.    No Known Allergies  Social History   Socioeconomic History   Marital status: Single    Spouse name: Not on file   Number of children: Not on file   Years of education: Not on file   Highest education level: Not on file  Occupational History   Not on file  Tobacco Use   Smoking status: Every Day    Packs/day: 0.50    Years: 50.00    Additional pack years: 0.00    Total pack years: 25.00    Types: Cigarettes    Start date: 1969    Passive exposure: Current   Smokeless tobacco: Never   Tobacco comments:    0.5ppd as of 03/17/21 ep  Vaping Use   Vaping Use: Never used  Substance and Sexual Activity   Alcohol use: Yes    Alcohol/week: 6.0 standard drinks of alcohol    Types: 6 Standard drinks or equivalent per week   Drug use: Yes    Types: Marijuana   Sexual activity: Not Currently  Other Topics Concern   Not on file  Social History Narrative   Not on file   Social Determinants of Health   Financial Resource Strain: Not on  file  Food Insecurity: Not on file  Transportation Needs: Not on file  Physical Activity: Not on file  Stress: Not on file  Social Connections: Not on file  Intimate Partner Violence: Not on file    Family History  Problem Relation Age of Onset   Cancer Father    Lung disease Neg Hx     Past Surgical History:  Procedure Laterality Date   ankle surgery Left 1980   TOTAL KNEE ARTHROPLASTY Left 08/18/2021   Procedure: LEFT TOTAL KNEE ARTHROPLASTY;  Surgeon: Tarry Kos, MD;  Location: [facility] OR;  Service: Orthopedics;  Laterality: Left;    ROS: Review of Systems Negative except as stated above  PHYSICAL EXAM: There were no vitals taken for this visit.  Physical Exam  {male adult master:310786} {male adult master:310785}     Latest Ref Rng & Units 02/04/2022    2:04 PM 08/19/2021    2:41 AM 08/07/2021   11:38 AM  CMP  Glucose 70 - 99  mg/dL 409  811  914   BUN 8 - 27 mg/dL Creatinine 0.76 - 1.27 mg/dL 7.82  9.56  2.13   Sodium 134 - 144 mmol/L 141  135  141   Potassium 3.5 - 5.2 mmol/L 4.3  4.0  3.8   Chloride 96 - 106 mmol/L 103  103  110   CO2 20 - 29 mmol/L Calcium 8.6 - 10.2 mg/dL 9.7  8.7  8.7   Total Protein 6.5 - 8.1 g/dL  5.5  6.2   Total Bilirubin 0.3 - 1.2 mg/dL  0.4  0.5   Alkaline Phos 38 - 126 U/L  51  56   AST 15 - 41 U/L  16  12   ALT 0 - 44 U/L  11  12    Lipid Panel     Component Value Date/Time   CHOL 155 11/04/2020 1503   TRIG 109 11/04/2020 1503   HDL 59 11/04/2020 1503   CHOLHDL 2.6 11/04/2020 1503   LDLCALC 76 11/04/2020 1503    CBC    Component Value Date/Time   WBC 14.1 (H) 08/19/2021 0241   RBC 4.04 (L) 08/19/2021 0241   HGB 11.9 (L) 08/19/2021 0241   HGB 16.2 11/04/2020 1503   HCT 35.2 (L) 08/19/2021 0241   HCT 46.9 11/04/2020 1503   PLT 201 08/19/2021 0241   PLT 162 11/04/2020 1503   MCV 87.1 08/19/2021 0241   MCV 89 11/04/2020 1503   MCH 29.5 08/19/2021 0241   MCHC 33.8 08/19/2021 0241   RDW 14.5 08/19/2021 0241   RDW 13.5 11/04/2020 1503   LYMPHSABS 3.9 (H) 09/15/2016 1535   MONOABS 0.8 09/15/2016 1535   EOSABS 0.3 09/15/2016 1535   BASOSABS 0.1 09/15/2016 1535    ASSESSMENT AND PLAN:  There are no diagnoses linked to this encounter.   Patient was given the opportunity to ask questions.  Patient verbalized understanding of the plan and was able to repeat key elements of the plan. Patient was given clear instructions to go to Emergency Department or return to medical center if symptoms don't improve, worsen, or new problems develop.The patient verbalized understanding.   No orders of the defined types were placed in this encounter.    Requested Prescriptions    No prescriptions requested or ordered in this encounter    No follow-ups on file.  Rema Fendt, NP

## 2022-08-17 ENCOUNTER — Encounter: Payer: Self-pay | Admitting: Family

## 2022-08-17 ENCOUNTER — Ambulatory Visit (INDEPENDENT_AMBULATORY_CARE_PROVIDER_SITE_OTHER): Payer: Medicaid Other | Admitting: Family

## 2022-08-17 VITALS — BP 153/77 | HR 68 | Resp 16 | Ht 63.0 in | Wt 136.0 lb

## 2022-08-17 DIAGNOSIS — F32A Depression, unspecified: Secondary | ICD-10-CM

## 2022-08-17 DIAGNOSIS — E785 Hyperlipidemia, unspecified: Secondary | ICD-10-CM

## 2022-08-17 DIAGNOSIS — F17210 Nicotine dependence, cigarettes, uncomplicated: Secondary | ICD-10-CM

## 2022-08-17 DIAGNOSIS — E119 Type 2 diabetes mellitus without complications: Secondary | ICD-10-CM

## 2022-08-17 DIAGNOSIS — I10 Essential (primary) hypertension: Secondary | ICD-10-CM | POA: Diagnosis not present

## 2022-08-17 LAB — POCT GLYCOSYLATED HEMOGLOBIN (HGB A1C): HbA1c, POC (controlled diabetic range): 8.2 % — AB (ref 0.0–7.0)

## 2022-08-17 MED ORDER — LISINOPRIL 10 MG PO TABS: 10.0000 mg | ORAL_TABLET | Freq: Every day | ORAL | 0 refills | Status: DC

## 2022-08-17 MED ORDER — ATORVASTATIN CALCIUM 20 MG PO TABS: 20.0000 mg | ORAL_TABLET | Freq: Every day | ORAL | 0 refills | Status: DC

## 2022-08-17 MED ORDER — DAPAGLIFLOZIN PROPANEDIOL 5 MG PO TABS: 5.0000 mg | ORAL_TABLET | Freq: Every day | ORAL | 0 refills | Status: AC

## 2022-08-17 MED ORDER — METFORMIN HCL 1000 MG PO TABS: 1000.0000 mg | ORAL_TABLET | Freq: Two times a day (BID) | ORAL | 0 refills | Status: DC

## 2022-08-17 MED ORDER — FLUOXETINE HCL 10 MG PO CAPS: 10.0000 mg | ORAL_CAPSULE | Freq: Every day | ORAL | 0 refills | Status: DC

## 2022-08-17 MED ORDER — AMLODIPINE BESYLATE 5 MG PO TABS: 5.0000 mg | ORAL_TABLET | Freq: Every day | ORAL | 0 refills | Status: AC

## 2022-08-17 NOTE — Patient Instructions (Signed)
Take Amlodipine and Lisinopril for high blood pressure.  Take Metformin and Farxiga for diabetes.  Take Atorvastatin for high cholesterol.  Take Fluoxetine for depression. Schedule follow-up with primary provider in 4 weeks.  Fluoxetine Capsules or Tablets (PMDD) What is this medication? FLUOXETINE (floo OX e teen) treats premenstrual dysphoric disorder (PMDD). It increases the amount of serotonin in the brain, a hormone that helps regulate mood. It belongs to a group of medications called SSRIs. This medicine may be used for other purposes; ask your health care provider or pharmacist if you have questions. COMMON BRAND NAME(S): Prozac, Sarafem, Selfemra What should I tell my care team before I take this medication? They need to know if you have any of these conditions: Bipolar disorder or a family history of bipolar disorder Bleeding disorders Glaucoma Heart disease Liver disease Low levels of sodium in the blood Seizures Suicidal thoughts, plans, or attempt; a previous suicide attempt by you or a family member Take MAOIs like Carbex, Eldepryl, Marplan, Nardil, and Parnate Take medications that treat or prevent blood clots Thyroid disease An unusual or allergic reaction to fluoxetine, other medications, foods, dyes, or preservatives Pregnant or trying to get pregnant Breast-feeding How should I use this medication? Take this medication by mouth with a glass of water. Follow the directions on the prescription label. You can take it with or without food. Take your medication at regular intervals. Do not take it more often than directed. Do not stop taking this medication suddenly except upon the advice of your care team. Stopping this medication too quickly may cause serious side effects or your condition may worsen. A special MedGuide will be given to you by the pharmacist with each prescription and refill. Be sure to read this information carefully each time. Talk to your care team  about the use of this medication in children. Special care may be needed. Overdosage: If you think you have taken too much of this medicine contact a poison control center or emergency room at once. NOTE: This medicine is only for you. Do not share this medicine with others. What if I miss a dose? If you miss a dose, skip the missed dose and go back to your regular dosing schedule. Do not take double or extra doses. What may interact with this medication? Do not take this medication with any of the following: Other medications containing fluoxetine, like Prozac or Symbyax Cisapride Dronedarone Linezolid MAOIs like Carbex, Eldepryl, Marplan, Nardil, and Parnate Methylene blue (injected into a vein) Pimozide Thioridazine This medication may also interact with the following: Alcohol Amphetamines Aspirin and aspirin-like medications Carbamazepine Certain medications for depression, anxiety, or psychotic disturbances Certain medications for migraine headaches like almotriptan, eletriptan, frovatriptan, naratriptan, rizatriptan, sumatriptan, zolmitriptan Digoxin Diuretics Fentanyl Flecainide Furazolidone Isoniazid Lithium Medications for sleep Medications that treat or prevent blood clots like warfarin, enoxaparin, and dalteparin NSAIDs, medications for pain and inflammation, like ibuprofen or naproxen Other medications that prolong the QT interval (an abnormal heart rhythm) Phenytoin Procarbazine Propafenone Rasagiline Ritonavir Supplements like St. John's wort, kava kava, valerian Tramadol Tryptophan Vinblastine This list may not describe all possible interactions. Give your health care provider a list of all the medicines, herbs, non-prescription drugs, or dietary supplements you use. Also tell them if you smoke, drink alcohol, or use illegal drugs. Some items may interact with your medicine. What should I watch for while using this medication? Tell your care team if your  symptoms do not get better or if they get worse. Visit  your care team for regular checks on your progress. Because it may take several weeks to see the full effects of this medication, it is important to continue your treatment as prescribed. Watch for new or worsening thoughts of suicide or depression. This includes sudden changes in mood, behavior, or thoughts. These changes can happen at any time but are more common in the beginning of treatment or after a change in dose. Call your care team right away if you experience these thoughts or worsening depression. Manic episodes may happen in patients with bipolar disorder who take this medication. Watch for changes in feelings or behaviors such as feeling anxious, nervous, agitated, panicky, irritable, hostile, aggressive, impulsive, severely restless, overly excited and hyperactive, or trouble sleeping. These symptoms can happen at anytime but are more common in the beginning of treatment or after a change in dose. Call your care team right away if you notice any of these symptoms. You may get drowsy or dizzy. Do not drive, use machinery, or do anything that needs mental alertness until you know how this medication affects you. Do not stand or sit up quickly, especially if you are an older patient. This reduces the risk of dizzy or fainting spells. Alcohol may interfere with the effect of this medication. Avoid alcoholic drinks. Your mouth may get dry. Chewing sugarless gum or sucking hard candy, and drinking plenty of water may help. Contact your care team if the problem does not go away or is severe. This medication may affect blood sugar levels. If you have diabetes, check with your care team before you make changes to your diet or medications. What side effects may I notice from receiving this medication? Side effects that you should report to your care team as soon as possible: Allergic reactions--skin rash, itching, hives, swelling of the face, lips,  tongue, or throat Bleeding--bloody or black, tar-like stools, red or dark brown urine, vomiting blood or brown material that looks like coffee grounds, small, red or purple spots on skin, unusual bleeding or bruising Heart rhythm changes--fast or irregular heartbeat, dizziness, feeling faint or lightheaded, chest pain, trouble breathing Loss of appetite with weight loss Low sodium level--muscle weakness, fatigue, dizziness, headache, confusion Serotonin syndrome--irritability, confusion, fast or irregular heartbeat, muscle stiffness, twitching muscles, sweating, high fever, seizure, chills, vomiting, diarrhea Sudden eye pain or change in vision such as blurry vision, seeing halos around lights, vision loss Thoughts of suicide or self-harm, worsening mood, feelings of depression Side effects that usually do not require medical attention (report to your care team if they continue or are bothersome): Anxiety, nervousness Change in sex drive or performance Diarrhea Dry mouth Headache Excessive sweating Nausea Tremors or shaking Trouble sleeping Upset stomach This list may not describe all possible side effects. Call your doctor for medical advice about side effects. You may report side effects to FDA at 1-800-FDA-1088. Where should I keep my medication? Keep out of the reach of children and pets. Store at room temperature between 15 and 30 degrees C (59 and 86 degrees F). Get rid of any unused medication after the expiration date. NOTE: This sheet is a summary. It may not cover all possible information. If you have questions about this medicine, talk to your doctor, pharmacist, or health care provider.  2023 Elsevier/Gold Standard (2020-04-15 00:00:00)

## 2022-08-17 NOTE — Addendum Note (Signed)
Addended by: Margorie John on: 08/17/2022 10:56 AM   Modules accepted: Orders

## 2022-08-17 NOTE — Progress Notes (Signed)
Discuss home aid, brought paperwork with him today.  Falls often, pain in left hip, 8/10    A1C 8.2

## 2022-08-20 IMAGING — DX DG KNEE 1-2V PORT*L*
1 series · 2 of 2 positions shown · non-contrast
Comparison: 06/10/21

CLINICAL DATA: Status post left total knee arthroplasty.

EXAM:
PORTABLE LEFT KNEE - 1-2 VIEW

[Series 1: knee · 0.14mm/px · 2 of 2 slices shown]
[im 1/2]
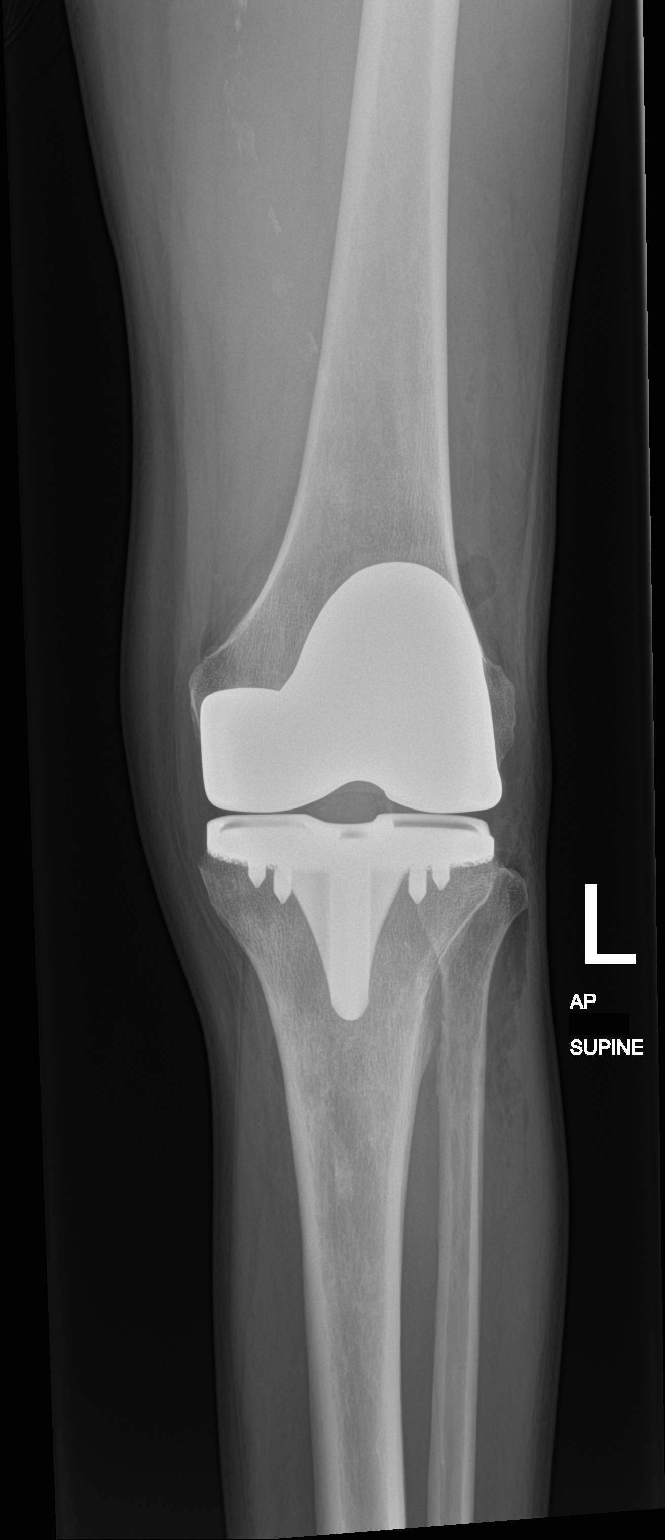
[im 2/2]
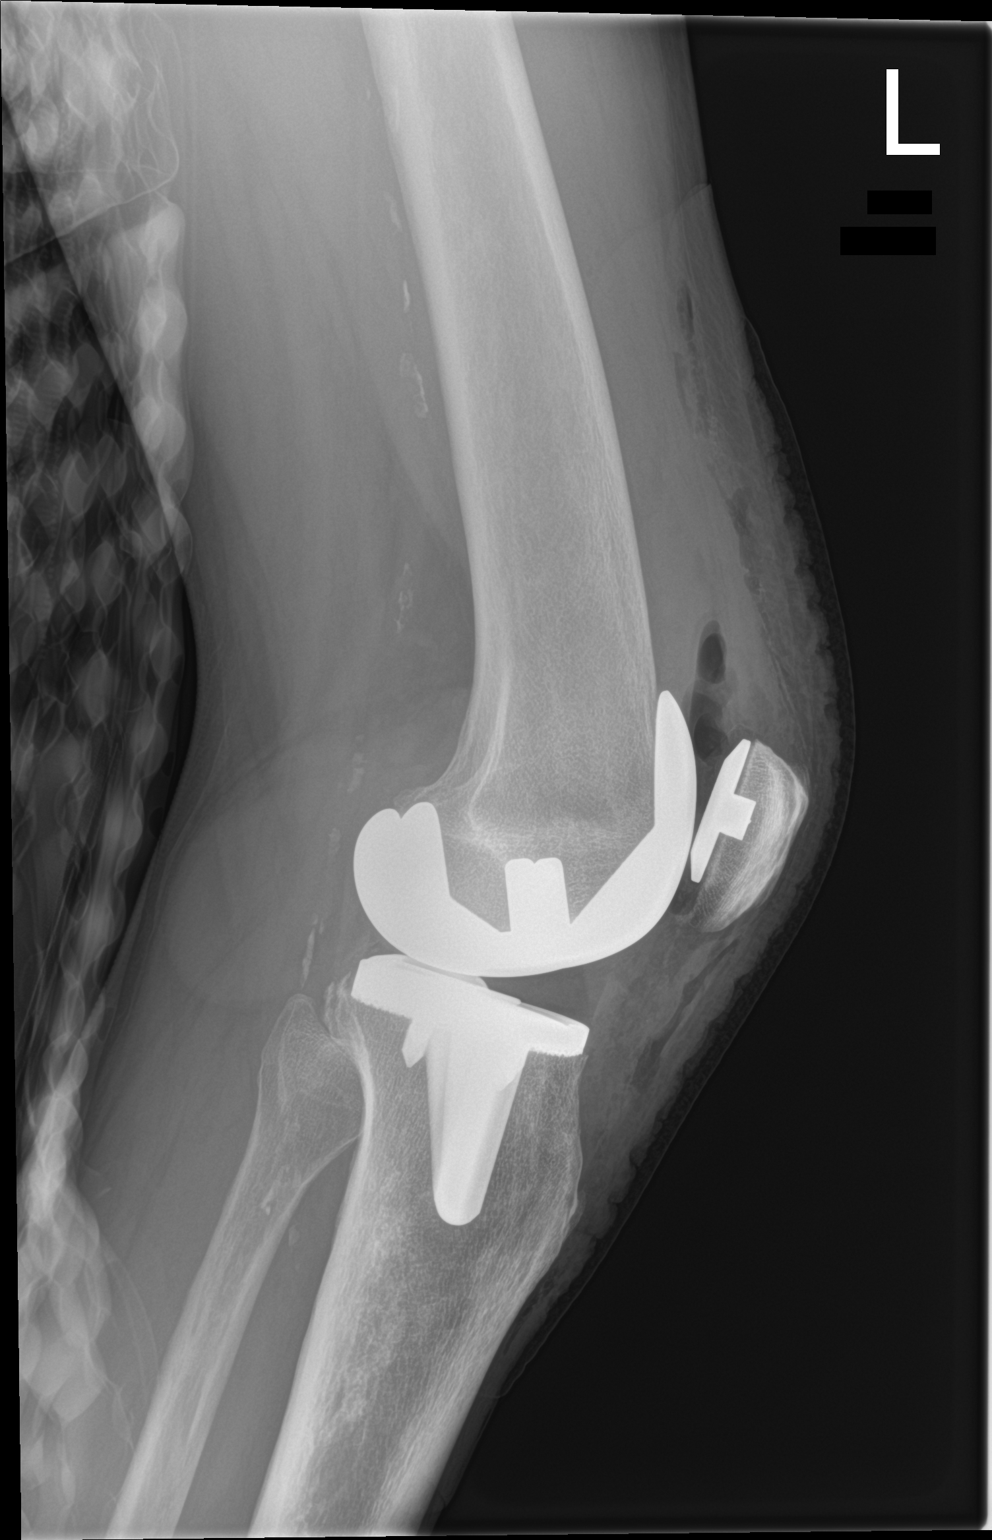

[2 of 2 positions shown; findings below may reference images not displayed]

FINDINGS: There are postsurgical changes from left total knee arthroplasty.
Hardware components are in anatomic alignment. Gas is identified
within the suprapatellar joint space and the soft tissues along the
anterior surface of the knee.
IMPRESSION: Status post left total knee arthroplasty.

## 2022-08-26 ENCOUNTER — Telehealth: Payer: Self-pay | Admitting: Pharmacist

## 2022-08-26 NOTE — Telephone Encounter (Signed)
Called patient to schedule an appointment for DM management. He was identified by a quality report as failing DM control on managed medicaid. I was unable to reach the patient so I left a HIPAA-compliant message requesting that the patient return my call.   Of note, I did research his profile and found that he reported partial compliance to his medication regimen. I suspect his DM control is suboptimal d/t this. However, I'd be happy to meet with him if needed.   Butch Penny, PharmD, P[place]y [place], CPP Clinical Pharmacist [facility] [phone]

## 2022-09-10 NOTE — Progress Notes (Signed)
Erroneous encounter-disregard

## 2022-09-14 ENCOUNTER — Other Ambulatory Visit: Payer: Self-pay | Admitting: Family

## 2022-09-14 DIAGNOSIS — F32A Depression, unspecified: Secondary | ICD-10-CM

## 2022-09-15 ENCOUNTER — Encounter: Payer: Medicaid Other | Admitting: Family

## 2022-09-15 DIAGNOSIS — E119 Type 2 diabetes mellitus without complications: Secondary | ICD-10-CM

## 2022-09-15 DIAGNOSIS — I10 Essential (primary) hypertension: Secondary | ICD-10-CM

## 2022-09-15 NOTE — Telephone Encounter (Signed)
Complete

## 2023-04-01 ENCOUNTER — Encounter: Payer: Self-pay | Admitting: Orthopaedic Surgery

## 2023-04-01 ENCOUNTER — Other Ambulatory Visit (INDEPENDENT_AMBULATORY_CARE_PROVIDER_SITE_OTHER): Payer: Self-pay

## 2023-04-01 ENCOUNTER — Ambulatory Visit: Payer: Medicaid Other | Admitting: Orthopaedic Surgery

## 2023-04-01 DIAGNOSIS — M5441 Lumbago with sciatica, right side: Secondary | ICD-10-CM | POA: Diagnosis not present

## 2023-04-01 DIAGNOSIS — Z96652 Presence of left artificial knee joint: Secondary | ICD-10-CM

## 2023-04-01 DIAGNOSIS — G8929 Other chronic pain: Secondary | ICD-10-CM | POA: Diagnosis not present

## 2023-04-01 MED ORDER — PREDNISONE 10 MG (21) PO TBPK: ORAL_TABLET | ORAL | 3 refills | Status: DC

## 2023-04-01 MED ORDER — MELOXICAM 7.5 MG PO TABS: 7.5000 mg | ORAL_TABLET | Freq: Two times a day (BID) | ORAL | 2 refills | Status: AC | PRN

## 2023-04-01 NOTE — Progress Notes (Signed)
Office Visit Note   Patient: Marcus [name]           Date of Birth: [birth year]           MRN: [id number] Visit Date: 04/01/2023              Requested by: Rema Fendt, NP [address] [place],  [place] [address] PCP: Rema Fendt, NP   Assessment & Plan: Visit Diagnoses:  1. Status post total left knee replacement   2. Chronic right-sided low back pain with right-sided sciatica     Plan: Marcus [name] is 62 year old gentleman with chronic right sided neck and low back pain.  His knee replacement has done well for him and he is happy with the function.  I will send him to physical therapy for his neck and back.  Prescription for prednisone Dosepak and meloxicam.  Follow-up as needed.  Follow-Up Instructions: No follow-ups on file.   Orders:  Orders Placed This Encounter  Procedures   XR Knee 1-2 Views Left   Ambulatory referral to Physical Therapy   Meds ordered this encounter  Medications   predniSONE (STERAPRED UNI-PAK 21 TAB) 10 MG (21) TBPK tablet    Sig: Take as directed    Dispense:  21 tablet    Refill:  3   meloxicam (MOBIC) 7.5 MG tablet    Sig: Take 1 tablet (7.5 mg total) by mouth 2 (two) times daily as needed for pain.    Dispense:  30 tablet    Refill:  2      Procedures: No procedures performed   Clinical Data: No additional findings.   Subjective: Chief Complaint  Patient presents with   Left Knee - Follow-up    Left total knee arthroplasty 08/18/2021    HPI Patient is a 62 year old gentleman who comes in for right-sided neck and low back pain.  Has pain all over his body.  Has been using a cane.  He has done well from his left knee replacement from a year and a half ago.  He has no complaints in regards to this. Review of Systems  Constitutional: Negative.   HENT: Negative.    Eyes: Negative.   Respiratory: Negative.    Cardiovascular: Negative.   Gastrointestinal: Negative.   Endocrine: Negative.   Genitourinary: Negative.    Musculoskeletal:  Positive for back pain and neck pain.  Skin: Negative.   Allergic/Immunologic: Negative.   Neurological: Negative.   Hematological: Negative.   Psychiatric/Behavioral: Negative.    All other systems reviewed and are negative.    Objective: Vital Signs: There were no vitals taken for this visit.  Physical Exam Vitals and nursing note reviewed.  Constitutional:      Appearance: He is well-developed.  Pulmonary:     Effort: Pulmonary effort is normal.  Abdominal:     Palpations: Abdomen is soft.  Skin:    General: Skin is warm.  Neurological:     Mental Status: He is alert and oriented to person, place, and time.  Psychiatric:        Behavior: Behavior normal.        Thought Content: Thought content normal.        Judgment: Judgment normal.     Ortho Exam Exam of the left knee is unremarkable.  Fully healed surgical scar. Exam of the neck and low back are nonfocal. Specialty Comments:  No specialty comments available.  Imaging: XR Knee 1-2 Views Left  Result Date: 04/01/2023  X-rays of the left knee show a stable left total knee replacement in good alignment.     PMFS History: Patient Active Problem List   Diagnosis Date Noted   Primary osteoarthritis of left knee 08/18/2021   Status post total left knee replacement 08/18/2021   Past Medical History:  Diagnosis Date   Diabetes mellitus without complication (HCC)    Hypertension     Family History  Problem Relation Age of Onset   Cancer Father    Lung disease Neg Hx     Past Surgical History:  Procedure Laterality Date   ankle surgery Left 1980   TOTAL KNEE ARTHROPLASTY Left 08/18/2021   Procedure: LEFT TOTAL KNEE ARTHROPLASTY;  Surgeon: Tarry Kos, MD;  Location: [facility] OR;  Service: Orthopedics;  Laterality: Left;   Social History   Occupational History   Not on file  Tobacco Use   Smoking status: Every Day    Current packs/day: 0.50    Average packs/day: 0.5 packs/day for 55.9  years (28.0 ttl pk-yrs)    Types: Cigarettes    Start date: 1969    Passive exposure: Current   Smokeless tobacco: Never   Tobacco comments:    0.5ppd as of 03/17/21 ep  Vaping Use   Vaping status: Never Used  Substance and Sexual Activity   Alcohol use: Yes    Alcohol/week: 6.0 standard drinks of alcohol    Types: 6 Standard drinks or equivalent per week   Drug use: Yes    Types: Marijuana   Sexual activity: Not Currently

## 2023-04-12 ENCOUNTER — Ambulatory Visit: Payer: Medicaid Other | Admitting: Physical Therapy

## 2023-04-12 DIAGNOSIS — M5441 Lumbago with sciatica, right side: Secondary | ICD-10-CM | POA: Insufficient documentation

## 2023-04-12 DIAGNOSIS — R262 Difficulty in walking, not elsewhere classified: Secondary | ICD-10-CM | POA: Insufficient documentation

## 2023-04-12 DIAGNOSIS — G8929 Other chronic pain: Secondary | ICD-10-CM | POA: Insufficient documentation

## 2023-04-12 DIAGNOSIS — M5459 Other low back pain: Secondary | ICD-10-CM | POA: Insufficient documentation

## 2023-04-12 DIAGNOSIS — M25551 Pain in right hip: Secondary | ICD-10-CM | POA: Insufficient documentation

## 2023-04-12 DIAGNOSIS — M6281 Muscle weakness (generalized): Secondary | ICD-10-CM | POA: Insufficient documentation

## 2023-04-12 DIAGNOSIS — Z96652 Presence of left artificial knee joint: Secondary | ICD-10-CM

## 2023-04-12 DIAGNOSIS — R269 Unspecified abnormalities of gait and mobility: Secondary | ICD-10-CM | POA: Insufficient documentation

## 2023-04-12 NOTE — Therapy (Signed)
[facility] [address] [place], [place], [address] Phone: [phone]   Fax:  [phone]  Patient Details  Name: Marcus [name] MRN: [id number] Date of Birth: [birth year] Referring Provider:  Tarry Kos, MD  Encounter Date: 04/12/2023  Arrive no charge. Patient arrives late to initial appointment. PT has opening later and tries to reschedule; however, patient reports that has ride coming at 9:30 and not time for 15 minute eval. Rescheduled patient to Thursday.  Carmelia Bake, PT, DPT 04/12/2023, 9:27 AM  [facility] [address] [place], [place], [address] Phone: [phone]   Fax:  [phone]

## 2023-04-15 ENCOUNTER — Encounter: Payer: Self-pay | Admitting: Physical Therapy

## 2023-04-15 ENCOUNTER — Other Ambulatory Visit: Payer: Self-pay

## 2023-04-15 ENCOUNTER — Ambulatory Visit: Payer: Medicaid Other | Attending: Orthopaedic Surgery | Admitting: Physical Therapy

## 2023-04-15 VITALS — BP 164/95 | HR 70

## 2023-04-15 DIAGNOSIS — R269 Unspecified abnormalities of gait and mobility: Secondary | ICD-10-CM

## 2023-04-15 DIAGNOSIS — M6281 Muscle weakness (generalized): Secondary | ICD-10-CM | POA: Diagnosis present

## 2023-04-15 DIAGNOSIS — M25551 Pain in right hip: Secondary | ICD-10-CM | POA: Diagnosis not present

## 2023-04-15 DIAGNOSIS — R262 Difficulty in walking, not elsewhere classified: Secondary | ICD-10-CM | POA: Diagnosis present

## 2023-04-15 DIAGNOSIS — M5459 Other low back pain: Secondary | ICD-10-CM

## 2023-04-15 DIAGNOSIS — G8929 Other chronic pain: Secondary | ICD-10-CM | POA: Diagnosis present

## 2023-04-15 DIAGNOSIS — M5441 Lumbago with sciatica, right side: Secondary | ICD-10-CM | POA: Diagnosis present

## 2023-04-15 DIAGNOSIS — Z96652 Presence of left artificial knee joint: Secondary | ICD-10-CM | POA: Diagnosis not present

## 2023-04-15 NOTE — Therapy (Signed)
OUTPATIENT PHYSICAL THERAPY THORACOLUMBAR EVALUATION   Patient Name: Marcus [name] MRN: [id number] DOB:June 22, 1960, 62 y.o., male Today's Date: 04/15/2023  END OF SESSION:  PT End of Session - 04/15/23 0837     Visit Number 1    Number of Visits 7    Date for PT Re-Evaluation 06/10/23    Authorization Type  Medicaid    PT Start Time [phone]    PT Stop Time 0920    PT Time Calculation (min) 43 min    Equipment Utilized During Treatment Other (comment)   gait belt not used as session on mat level   Activity Tolerance Patient tolerated treatment well    Behavior During Therapy WFL for tasks assessed/performed             Past Medical History:  Diagnosis Date   Diabetes mellitus without complication (HCC)    Hypertension    Past Surgical History:  Procedure Laterality Date   ankle surgery Left 1980   TOTAL KNEE ARTHROPLASTY Left 08/18/2021   Procedure: LEFT TOTAL KNEE ARTHROPLASTY;  Surgeon: Tarry Kos, MD;  Location: [facility] OR;  Service: Orthopedics;  Laterality: Left;   Patient Active Problem List   Diagnosis Date Noted   Primary osteoarthritis of left knee 08/18/2021   Status post total left knee replacement 08/18/2021    PCP: [name] (patient will bring in patient's full name in the future)   REFERRING PROVIDER: Tarry Kos, MD  REFERRING DIAG: [phone] (ICD-10-CM) - Status post total left knee replacement M54.41,G89.29 (ICD-10-CM) - Chronic right-sided low back pain with right-sided sciatica  Rationale for Evaluation and Treatment: Rehabilitation  THERAPY DIAG:  Other low back pain - Plan: PT plan of care cert/re-cert  Abnormality of gait and mobility - Plan: PT plan of care cert/re-cert  ONSET DATE: 04/01/2023 (referral date)  SUBJECTIVE:                                                                                                                                                                                           SUBJECTIVE STATEMENT: Patient  known to [facility] for rehab for L knee. Patient reports a history of back pain for "years" "on and off" but reports "it is getting worse as I get older." Patient reports that since his knee replacement in August 18, 2021, he has noticed a lot more pain. His pain is mostly on the R lateral side of is low back. Patient reports a history of urinary urgency with a few accidens but denies bowel changes. Patient has a caregiver that comes. Patient has been out of work since his knee replacement. Patient  reports that he has had x-ray of his low back but denies MRI. Patient reports he typically notices more pain leaning forward. He reports that he is using a cane mostly out in community and is using a walker in his house. Patient reports did take pain medication this morning; did not take blood pressure medication.   Attends session with self - arrives via transportation  PERTINENT HISTORY:  L total knee arthroplasty 08/19/1922, hypertension  PAIN:  Are you having pain? Yes: NPRS scale: 9/10 Pain location: R low back Pain description: throbbing Aggravating factors: moving a lot Relieving factors: sitting down, leaning against a pillow, smoking marijuana   PRECAUTIONS: Fall   RED FLAGS: Urinary urgency - doctor aware and managing per patient    WEIGHT BEARING RESTRICTIONS: No  FALLS:  Has patient fallen in last 6 months? No  LIVING ENVIRONMENT: Lives with:  lives with sister Lives in: House/apartment Stairs: Yes: External: 4 steps; can reach both Has following equipment at home: Single point cane, Walker - 2 wheeled, and Grab bars  OCCUPATION: Not working   PLOF:  Requires assistance for transportation from a caregiver International aid/development worker) but is otherwise independent    PATIENT GOALS: "I want to be able to move around a lot better than I do right now."   NEXT MD VISIT: December 31 with Dr. Roda Shutters  OBJECTIVE:  Note: Objective measures were completed at Evaluation unless otherwise  noted.  DIAGNOSTIC FINDINGS:  None visible on file  PATIENT SURVEYS:  Modified Oswestry 34/50 = 68% impairment    COGNITION: Overall cognitive status: Within functional limits for tasks assessed     SENSATION: Decreased sensation in L hand and feet  POSTURE: No Significant postural limitations  PALPATION: Decreased segment mobility from L3-S1, reports pain across low back and some radiating down into R leg with light palpation, increased muscle tightness noted along paraspinals  LUMBAR ROM:   AROM eval  Flexion WFL - ache on R side, unchanged with repeated motions   Extension WFL - reports some pain on L side and more intense than flexion, reports increase pain when repeated  Right lateral flexion WFL - pain on the R side  Left lateral flexion WFL - pain on the R hip  Right rotation 80% - pain on R side  Left rotation 80%    (Blank rows = not tested)  LOWER EXTREMITY ROM:     Grossly WFL, increased tightness of hamstrings noted  LOWER EXTREMITY MMT:    MMT Right eval Left eval  Hip flexion 4-/5 4-/5  Hip extension    Hip abduction 4-/5 4-/5  Hip adduction    Hip internal rotation    Hip external rotation    Knee flexion 4-/5 4-/5  Knee extension 4-/5 4-/5  Ankle dorsiflexion 4-/5 4-/5  Ankle plantarflexion    Ankle inversion    Ankle eversion     (Blank rows = not tested)  Denies pain with all motions; tested in seated  LUMBAR SPECIAL TESTS:  Denies pain with repeated motions, positive slump on the R   FUNCTIONAL TESTS:  Held due to elevated readings  GAIT: Distance walked: mild antalgic gait pattern Assistive device utilized: Single point cane Level of assistance: Modified independence  Vitals:   04/15/23 0842 04/15/23 0921  BP: (!) 166/84 (!) 164/95  Pulse: 73 70  Assessed at start and end of session; recommend follow up with PCP for management, discussed safe BP ranges, patient asymptomatic   TREATMENT DATE:  Initial Eval only   PATIENT EDUCATION:  Education details: POC, goal collaboration, examination findings Person educated: Patient Education method: Explanation Education comprehension: verbalized understanding  HOME EXERCISE PROGRAM: To be provided  ASSESSMENT:  CLINICAL IMPRESSION: Patient is a 62 y.o. male who was seen today for physical therapy evaluation and treatment for low back pain with some radiation down into LE. Patient presents with impairments included muscle weakness, minor limitations in lumbar rotation, pain with movement, abnormal gait, and reliance on AD for stability. Patient presents with moderate levels of impairment as indicated by impairment on modified Oswestry disability index. Patient presents with flexion preference in today's session and positive slump test on the R. Patient will benefit from skilled physical therapy services to decrease pain and improve function so patient can participate in more community activities.   OBJECTIVE IMPAIRMENTS: Abnormal gait, decreased balance, decreased mobility, decreased ROM, decreased strength, and pain.   ACTIVITY LIMITATIONS: carrying, bending, standing, and squatting  PARTICIPATION LIMITATIONS: community activity and yard work  PERSONAL FACTORS: Age, Time since onset of injury/illness/exacerbation, and 1-2 comorbidities: see above  are also affecting patient's functional outcome.   REHAB POTENTIAL: Good  CLINICAL DECISION MAKING: Stable/uncomplicated  EVALUATION COMPLEXITY: Low   GOALS: Goals reviewed with patient? Yes  SHORT TERM GOALS: Target date: 05/06/2023  Patient will demonstrate independence with initial HEP to continue to progress between physical therapy sessions.   Baseline: To be provided Goal status: INITIAL  2.  10 MWT to be assessed / LTG written Baseline: To be assessed Goal status: INITIAL  3.   FxSTS to be assessed / LTG written Baseline: To be assessed Goal status: INITIAL   LONG TERM GOALS: Target date: 06/10/2023  Patient will report demonstrate independence with final HEP in order to maintain current gains and continue to progress after physical therapy discharge.   Baseline: To be provided Goal status: INITIAL  2.  Patient will improve modified ODI score to 55% impairment or less to indicate a clinically important improvement in low back pain.   Baseline: 68% impairment Goal status: INITIAL  3.  10 MWT to be assessed / LTG written Baseline: To be assessed Goal status: INITIAL  4.  FxSTS to be assessed / LTG written Baseline: To be assessed Goal status: INITIAL    PLAN:  PT FREQUENCY: 1x/week  PT DURATION: 6 weeks  PLANNED INTERVENTIONS: 97164- PT Re-evaluation, 97110-Therapeutic exercises, 97530- Therapeutic activity, 97112- Neuromuscular re-education, 97535- Self Care, 16109- Manual therapy, 97116- Gait training, Dry Needling, and Joint mobilization.  PLAN FOR NEXT SESSION: + FxSTS to be assessed, flexion based HEP for low back pain    Carmelia Bake, PT 04/15/2023, 9:36 AM

## 2023-04-23 ENCOUNTER — Ambulatory Visit: Payer: Medicaid Other | Admitting: Physical Therapy

## 2023-04-23 DIAGNOSIS — G8929 Other chronic pain: Secondary | ICD-10-CM

## 2023-04-23 DIAGNOSIS — M5459 Other low back pain: Secondary | ICD-10-CM | POA: Diagnosis not present

## 2023-04-23 DIAGNOSIS — M5441 Lumbago with sciatica, right side: Secondary | ICD-10-CM | POA: Diagnosis not present

## 2023-04-23 DIAGNOSIS — M6281 Muscle weakness (generalized): Secondary | ICD-10-CM

## 2023-04-23 DIAGNOSIS — R269 Unspecified abnormalities of gait and mobility: Secondary | ICD-10-CM

## 2023-04-23 DIAGNOSIS — R262 Difficulty in walking, not elsewhere classified: Secondary | ICD-10-CM

## 2023-04-23 NOTE — Therapy (Signed)
OUTPATIENT PHYSICAL THERAPY THORACOLUMBAR EVALUATION   Patient Name: Marcus [name] MRN: [id number] DOB:02-11-1961, 62 y.o., male Today's Date: 04/23/2023  END OF SESSION:  PT End of Session - 04/23/23 1352     Visit Number 2    Number of Visits 7    Date for PT Re-Evaluation 06/10/23    Authorization Type [place] [employer]    PT Start Time 1352    PT Stop Time 1435    PT Time Calculation (min) 43 min    Equipment Utilized During Treatment Other (comment)   gait belt not used as session on mat level   Activity Tolerance Patient tolerated treatment well    Behavior During Therapy WFL for tasks assessed/performed              Past Medical History:  Diagnosis Date   Diabetes mellitus without complication (HCC)    Hypertension    Past Surgical History:  Procedure Laterality Date   ankle surgery Left 1980   TOTAL KNEE ARTHROPLASTY Left 08/18/2021   Procedure: LEFT TOTAL KNEE ARTHROPLASTY;  Surgeon: Tarry Kos, MD;  Location: [facility] OR;  Service: Orthopedics;  Laterality: Left;   Patient Active Problem List   Diagnosis Date Noted   Primary osteoarthritis of left knee 08/18/2021   Status post total left knee replacement 08/18/2021    PCP: [name] (patient will bring in patient's full name in the future)   REFERRING PROVIDER: Tarry Kos, MD  REFERRING DIAG: [phone] (ICD-10-CM) - Status post total left knee replacement M54.41,G89.29 (ICD-10-CM) - Chronic right-sided low back pain with right-sided sciatica  Rationale for Evaluation and Treatment: Rehabilitation  THERAPY DIAG:  No diagnosis found.  ONSET DATE: 04/01/2023 (referral date)  SUBJECTIVE:                                                                                                                                                                                           SUBJECTIVE STATEMENT: Pt states he has been able to do some of the exercises.   From eval: Patient known to [facility] for rehab  for L knee. Patient reports a history of back pain for "years" "on and off" but reports "it is getting worse as I get older." Patient reports that since his knee replacement in August 18, 2021, he has noticed a lot more pain. His pain is mostly on the R lateral side of is low back. Patient reports a history of urinary urgency with a few accidens but denies bowel changes. Patient has a caregiver that comes. Patient has been out of work since his knee replacement. Patient reports that he has  had x-ray of his low back but denies MRI. Patient reports he typically notices more pain leaning forward. He reports that he is using a cane mostly out in community and is using a walker in his house. Patient reports did take pain medication this morning; did not take blood pressure medication.   Attends session with self - arrives via transportation  PERTINENT HISTORY:  L total knee arthroplasty 08/19/1922, hypertension  PAIN:  Are you having pain? Yes: NPRS scale: 8/10 Pain location: R low back/hip Pain description: throbbing Aggravating factors: moving a lot Relieving factors: sitting down, leaning against a pillow, smoking marijuana   PRECAUTIONS: Fall   RED FLAGS: Urinary urgency - doctor aware and managing per patient    WEIGHT BEARING RESTRICTIONS: No  FALLS:  Has patient fallen in last 6 months? No  LIVING ENVIRONMENT: Lives with:  lives with sister Lives in: House/apartment Stairs: Yes: External: 4 steps; can reach both Has following equipment at home: Single point cane, Walker - 2 wheeled, and Grab bars  OCCUPATION: Not working   PLOF:  Requires assistance for transportation from a caregiver International aid/development worker) but is otherwise independent    PATIENT GOALS: "I want to be able to move around a lot better than I do right now."   NEXT MD VISIT: December 31 with Dr. Roda Shutters  OBJECTIVE:  Note: Objective measures were completed at Evaluation unless otherwise noted.  PATIENT SURVEYS:  Modified  Oswestry 34/50 = 68% impairment      SENSATION: Decreased sensation in L hand and feet  PALPATION: Decreased segment mobility from L3-S1, reports pain across low back and some radiating down into R leg with light palpation, increased muscle tightness noted along paraspinals  LUMBAR ROM:   AROM eval  Flexion WFL - ache on R side, unchanged with repeated motions   Extension WFL - reports some pain on L side and more intense than flexion, reports increase pain when repeated  Right lateral flexion WFL - pain on the R side  Left lateral flexion WFL - pain on the R hip  Right rotation 80% - pain on R side  Left rotation 80%    (Blank rows = not tested)  LOWER EXTREMITY ROM:     Grossly WFL, increased tightness of hamstrings noted R hip flexion hypomobile --> PROM to 110 deg  LOWER EXTREMITY MMT:    MMT Right eval Left eval  Hip flexion 4-/5 4-/5  Hip extension    Hip abduction 4-/5 4-/5  Hip adduction    Hip internal rotation    Hip external rotation    Knee flexion 4-/5 4-/5  Knee extension 4-/5 4-/5  Ankle dorsiflexion 4-/5 4-/5  Ankle plantarflexion    Ankle inversion    Ankle eversion     (Blank rows = not tested)  Denies pain with all motions; tested in seated  LUMBAR SPECIAL TESTS:  Denies pain with repeated motions, positive slump on the R   FUNCTIONAL TESTS:  5x STS: 24 sec : 1.14 m/s but pt demos lateral trunk lean and instability  GAIT: Distance walked: mild antalgic gait pattern Assistive device utilized: Single point cane Level of assistance: Modified independence  TREATMENT DATE:  04/23/2023 Therex Checked 5x STS and 10 MWT Supine  LTR 5x10"  Single knee to chest x30"  Piriformis stretch x 30"  Hip flexor stretch x 30" Sitting piriformis stretch 3x10 Standing  Hip abd red TB around knees 2x10  Hip ext red TB  around knees x10  ITB stretch x20"  Palloff press red TB x10  Manual therapy  Grade II to III R hip mobilization for hip flexion  Skilled assessment and palpation for TPDN  Trigger Point Dry-Needling  Treatment instructions: Expect mild to moderate muscle soreness. S/S of pneumothorax if dry needled over a lung field, and to seek immediate medical attention should they occur. Patient verbalized understanding of these instructions and education.  Patient Consent Given: Yes Education handout provided: Yes Muscles treated: bilat glutes Electrical stimulation performed: No Parameters: N/A Treatment response/outcome: Decreased muscle tension, twitch response     PATIENT EDUCATION:  Education details: POC, goal collaboration, examination findings Person educated: Patient Education method: Explanation Education comprehension: verbalized understanding  HOME EXERCISE PROGRAM: Access Code: [email or web address] URL: [email or web address] Date: 04/23/2023 Prepared by: Vernon Prey April Kirstie Peri  Exercises - Supine Piriformis Stretch with Foot on Ground  - 1 x daily - 7 x weekly - 2 sets - 30 sec hold - Seated Piriformis Stretch  - 1 x daily - 7 x weekly - 2 sets - 30 sec hold - ITB Stretch at Wall  - 1 x daily - 7 x weekly - 2 sets - 30 sec hold - Standing Anti-Rotation Press with Anchored Resistance  - 1 x daily - 7 x weekly - 2 sets - 10 reps - Standing Hip Abduction with Resistance at Thighs  - 1 x daily - 7 x weekly - 2 sets - 10 reps - Standing Hip Extension with Resistance at Ankles and Counter Support  - 1 x daily - 7 x weekly - 2 sets - 10 reps  ASSESSMENT:  CLINICAL IMPRESSION: Pt found to have limited R hip flexion today. Provided gentle manual work, stretching and dry needling to address his posterior hip pain/tightness. Found that pt tends to overcompensate in lumbar during hip flexion to make up for his lack of range. Added strengthening exercises for his hip and core  today. Assessed his 5xSTS and gait speed. Gait speed is WNL but pt demos gait abnormalities. 5x STS indicates a high fall risk.   From eval: Patient is a 62 y.o. male who was seen today for physical therapy evaluation and treatment for low back pain with some radiation down into LE. Patient presents with impairments included muscle weakness, minor limitations in lumbar rotation, pain with movement, abnormal gait, and reliance on AD for stability. Patient presents with moderate levels of impairment as indicated by impairment on modified Oswestry disability index. Patient presents with flexion preference in today's session and positive slump test on the R. Patient will benefit from skilled physical therapy services to decrease pain and improve function so patient can participate in more community activities.   OBJECTIVE IMPAIRMENTS: Abnormal gait, decreased balance, decreased mobility, decreased ROM, decreased strength, and pain.    GOALS: Goals reviewed with patient? Yes  SHORT TERM GOALS: Target date: 05/06/2023  Patient will demonstrate independence with initial HEP to continue to progress between physical therapy sessions.   Baseline: To be provided Goal status: INITIAL  2.  10 MWT to be assessed / LTG written Baseline: To be assessed Goal status: MET 04/23/23  3.  FxSTS to be assessed / LTG written Baseline: To  be assessed Goal status: MET 04/23/23   LONG TERM GOALS: Target date: 06/10/2023  Patient will report demonstrate independence with final HEP in order to maintain current gains and continue to progress after physical therapy discharge.   Baseline: To be provided Goal status: INITIAL  2.  Patient will improve modified ODI score to 55% impairment or less to indicate a clinically important improvement in low back pain.   Baseline: 68% impairment Goal status: INITIAL  3.  10 MWT to improve to 1.2 m/s with decreased gait abnormalities Baseline:  Goal status: INITIAL  4.   FxSTS to improve to </=15 sec to demo increased functional LE strength Baseline:  Goal status: INITIAL    PLAN:  PT FREQUENCY: 1x/week  PT DURATION: 6 weeks  PLANNED INTERVENTIONS: 97164- PT Re-evaluation, 97110-Therapeutic exercises, 97530- Therapeutic activity, 97112- Neuromuscular re-education, 97535- Self Care, 16109- Manual therapy, 97116- Gait training, Dry Needling, and Joint mobilization.  PLAN FOR NEXT SESSION: + FxSTS to be assessed, flexion based HEP for low back pain    Xavia Kniskern April Ma L Neyland Pettengill, PT 04/23/2023, 1:52 PM

## 2023-04-30 ENCOUNTER — Encounter: Payer: Self-pay | Admitting: Physical Therapy

## 2023-04-30 ENCOUNTER — Ambulatory Visit: Payer: Medicaid Other | Attending: Orthopaedic Surgery | Admitting: Physical Therapy

## 2023-04-30 VITALS — BP 158/89 | HR 70

## 2023-04-30 DIAGNOSIS — R262 Difficulty in walking, not elsewhere classified: Secondary | ICD-10-CM | POA: Diagnosis present

## 2023-04-30 DIAGNOSIS — M6281 Muscle weakness (generalized): Secondary | ICD-10-CM | POA: Insufficient documentation

## 2023-04-30 DIAGNOSIS — G8929 Other chronic pain: Secondary | ICD-10-CM | POA: Diagnosis present

## 2023-04-30 DIAGNOSIS — M5441 Lumbago with sciatica, right side: Secondary | ICD-10-CM | POA: Diagnosis present

## 2023-04-30 DIAGNOSIS — M25562 Pain in left knee: Secondary | ICD-10-CM | POA: Insufficient documentation

## 2023-04-30 DIAGNOSIS — Z96652 Presence of left artificial knee joint: Secondary | ICD-10-CM | POA: Diagnosis present

## 2023-04-30 DIAGNOSIS — M5459 Other low back pain: Secondary | ICD-10-CM | POA: Insufficient documentation

## 2023-04-30 DIAGNOSIS — R269 Unspecified abnormalities of gait and mobility: Secondary | ICD-10-CM | POA: Insufficient documentation

## 2023-04-30 NOTE — Therapy (Signed)
 OUTPATIENT PHYSICAL THERAPY THORACOLUMBAR TREATMENT   Patient Name: Marcus [name] MRN: [id number] DOB:1960/08/19, 63 y.o., male Today's Date: 04/30/2023  END OF SESSION:  PT End of Session - 04/30/23 0851     Visit Number 3    Number of Visits 7    Date for PT Re-Evaluation 06/10/23    Authorization Type [place] Medicaid    PT Start Time 0850    PT Stop Time 0928    PT Time Calculation (min) 38 min    Equipment Utilized During Treatment Other (comment)   gait belt not needed as at mat level   Activity Tolerance Patient tolerated treatment well    Behavior During Therapy [facility] for tasks assessed/performed            Past Medical History:  Diagnosis Date   Diabetes mellitus without complication (HCC)    Hypertension    Past Surgical History:  Procedure Laterality Date   ankle surgery Left 1980   TOTAL KNEE ARTHROPLASTY Left 08/18/2021   Procedure: LEFT TOTAL KNEE ARTHROPLASTY;  Surgeon: Jerri Kay HERO, MD;  Location: [facility] OR;  Service: Orthopedics;  Laterality: Left;   Patient Active Problem List   Diagnosis Date Noted   Primary osteoarthritis of left knee 08/18/2021   Status post total left knee replacement 08/18/2021    PCP: [name] (patient will bring in patient's full name in the future)   REFERRING PROVIDER: Jerri Kay HERO, MD  REFERRING DIAG: [phone] (ICD-10-CM) - Status post total left knee replacement M54.41,G89.29 (ICD-10-CM) - Chronic right-sided low back pain with right-sided sciatica  Rationale for Evaluation and Treatment: Rehabilitation  THERAPY DIAG:  Other low back pain  Abnormality of gait and mobility  Muscle weakness (generalized)  ONSET DATE: 04/01/2023 (referral date)  SUBJECTIVE:                                                                                                                                                                                           SUBJECTIVE STATEMENT: Patient reports that his exercises have been going pretty good.  Both his back and his hip were hurting this morning but now his hip has eased up quite a bit and now it is mostly his back. Denies falls and near falls.   Attends session with self - arrives via transportation  PERTINENT HISTORY:  L total knee arthroplasty 08/19/1922, hypertension  PAIN:  Are you having pain? Yes: NPRS scale: 9/10 Pain location: R low back Pain description: throbbing Aggravating factors: moving a lot Relieving factors: sitting down, leaning against a pillow, smoking marijuana   PRECAUTIONS: Fall   RED FLAGS: Urinary urgency - doctor  aware and managing per patient    WEIGHT BEARING RESTRICTIONS: No  FALLS:  Has patient fallen in last 6 months? No  LIVING ENVIRONMENT: Lives with:  lives with sister Lives in: House/apartment Stairs: Yes: External: 4 steps; can reach both Has following equipment at home: Single point cane, Walker - 2 wheeled, and Grab bars  OCCUPATION: Not working   PLOF:  Requires assistance for transportation from a caregiver International Aid/development Worker) but is otherwise independent    PATIENT GOALS: I want to be able to move around a lot better than I do right now.   NEXT MD VISIT: December 31 with Dr. Jerri  OBJECTIVE:   TREATMENT DATE:                                                                                                                               04/30/2023  Vitals:   04/30/23 0855  BP: (!) 158/89  Pulse: 70  Seated on RUE; elevated but WFL for therapy, recommended follow up with PCP  Therex: SciFit level 3.3 x 8 min for neural priming, gentle ROM, and strength work for pain management at start of session  3 min warmup at self elected speed  Minute 4-7: 30 seconds on/30 seconds recovery 1 minute cool down at self elected speed  RPE: 9/10 Modified Thomas stretch 1 x 45 seconds on the L, 2 x 45 seconds on the R (increased tightness noted on the R) Supine piriformis stretch 2 x 30 seconds  Bent knee fallout x 2 min PPT with  supine march 2 x 20 reps Hip abduction slides with pillowcase in standing with UE support 2 x 10 reps   PATIENT EDUCATION:  Education details: Continue HEP + additions Person educated: Patient Education method: Explanation Education comprehension: verbalized understanding  HOME EXERCISE PROGRAM: Access Code: [email or web address] URL: [email or web address] Date: 04/30/2023 Prepared by: Lauraine Grumbling  Exercises - Supine Piriformis Stretch with Foot on Ground  - 1 x daily - 7 x weekly - 2 sets - 30 sec hold - Seated Piriformis Stretch  - 1 x daily - 7 x weekly - 2 sets - 30 sec hold - ITB Stretch at Wall  - 1 x daily - 7 x weekly - 2 sets - 30 sec hold - Standing Anti-Rotation Press with Anchored Resistance  - 1 x daily - 7 x weekly - 2 sets - 10 reps - Standing Hip Abduction with Resistance at Thighs  - 1 x daily - 7 x weekly - 2 sets - 10 reps - Standing Hip Extension with Resistance at Ankles and Counter Support  - 1 x daily - 7 x weekly - 2 sets - 10 reps - Supine March with Posterior Pelvic Tilt  - 1 x daily - 7 x weekly - 2 sets - 20 reps - Bent Knee Fallouts  - 1 x daily - 7 x weekly - 2 sets - 20 reps  ASSESSMENT:  CLINICAL IMPRESSION: Patient  continues to be limited by capsular tightness of hip so addressed with additional mobility work. Patient reporting decrease in pain post SciFit though reports minor increase in stiffness at end of session. Continued to emphasize importance of HEP to address deficits to which patient verbalized understanding. Continue POC.   OBJECTIVE IMPAIRMENTS: Abnormal gait, decreased balance, decreased mobility, decreased ROM, decreased strength, and pain.    GOALS: Goals reviewed with patient? Yes  SHORT TERM GOALS: Target date: 05/06/2023  Patient will demonstrate independence with initial HEP to continue to progress between physical therapy sessions.   Baseline: To be provided Goal status: INITIAL  2.  10 MWT to be assessed / LTG  written Baseline: 1.14 m/s but pt demos lateral trunk lean and instability Goal status: MET 04/23/23  3.  FxSTS to be assessed / LTG written Baseline: 24 sec Goal status: MET 04/23/23   LONG TERM GOALS: Target date: 06/10/2023  Patient will report demonstrate independence with final HEP in order to maintain current gains and continue to progress after physical therapy discharge.   Baseline: To be provided Goal status: INITIAL  2.  Patient will improve modified ODI score to 55% impairment or less to indicate a clinically important improvement in low back pain.   Baseline: 68% impairment Goal status: INITIAL  3.  10 MWT to improve to 1.2 m/s with decreased gait abnormalities Baseline: 1.14 m/s but pt demos lateral trunk lean and instability,  Goal status: INITIAL  4.  FxSTS to improve to </=15 sec to demo increased functional LE strength Baseline: 24 sec Goal status: INITIAL    PLAN:  PT FREQUENCY: 1x/week  PT DURATION: 6 weeks  PLANNED INTERVENTIONS: 97164- PT Re-evaluation, 97110-Therapeutic exercises, 97530- Therapeutic activity, 97112- Neuromuscular re-education, 97535- Self Care, [id number]- Manual therapy, 97116- Gait training, Dry Needling, and Joint mobilization.  PLAN FOR NEXT SESSION: flexion based exercises for lumbar spine, hip mobility and strength work, dry needling as indicate   Lauraine DELENA Grumbling, PT, DPT 04/30/2023, 12:11 PM

## 2023-05-07 ENCOUNTER — Ambulatory Visit: Payer: Medicaid Other | Admitting: Physical Therapy

## 2023-05-07 DIAGNOSIS — M5441 Lumbago with sciatica, right side: Secondary | ICD-10-CM | POA: Diagnosis not present

## 2023-05-07 DIAGNOSIS — R262 Difficulty in walking, not elsewhere classified: Secondary | ICD-10-CM

## 2023-05-07 DIAGNOSIS — M5459 Other low back pain: Secondary | ICD-10-CM | POA: Diagnosis not present

## 2023-05-07 DIAGNOSIS — Z96652 Presence of left artificial knee joint: Secondary | ICD-10-CM

## 2023-05-07 DIAGNOSIS — R269 Unspecified abnormalities of gait and mobility: Secondary | ICD-10-CM

## 2023-05-07 DIAGNOSIS — M6281 Muscle weakness (generalized): Secondary | ICD-10-CM

## 2023-05-07 DIAGNOSIS — G8929 Other chronic pain: Secondary | ICD-10-CM

## 2023-05-07 NOTE — Therapy (Signed)
 OUTPATIENT PHYSICAL THERAPY THORACOLUMBAR TREATMENT   Patient Name: Marcus [name] MRN: [id number] DOB:August 20, 1960, 63 y.o., male Today's Date: 05/07/2023  END OF SESSION:  PT End of Session - 05/07/23 0925     Visit Number 4    Number of Visits 7    Date for PT Re-Evaluation 06/10/23    Authorization Type [place] Medicaid    PT Start Time 0930    PT Stop Time 1010    PT Time Calculation (min) 40 min    Equipment Utilized During Treatment Other (comment)   gait belt not needed as at mat level   Activity Tolerance Patient tolerated treatment well    Behavior During Therapy WFL for tasks assessed/performed             Past Medical History:  Diagnosis Date   Diabetes mellitus without complication (HCC)    Hypertension    Past Surgical History:  Procedure Laterality Date   ankle surgery Left 1980   TOTAL KNEE ARTHROPLASTY Left 08/18/2021   Procedure: LEFT TOTAL KNEE ARTHROPLASTY;  Surgeon: Jerri Kay HERO, MD;  Location: [facility] OR;  Service: Orthopedics;  Laterality: Left;   Patient Active Problem List   Diagnosis Date Noted   Primary osteoarthritis of left knee 08/18/2021   Status post total left knee replacement 08/18/2021    PCP: [name] (patient will bring in patient's full name in the future)   REFERRING PROVIDER: Jerri Kay HERO, MD  REFERRING DIAG: [phone] (ICD-10-CM) - Status post total left knee replacement M54.41,G89.29 (ICD-10-CM) - Chronic right-sided low back pain with right-sided sciatica  Rationale for Evaluation and Treatment: Rehabilitation  THERAPY DIAG:  Other low back pain  Abnormality of gait and mobility  Muscle weakness (generalized)  Chronic right-sided low back pain with right-sided sciatica  Difficulty in walking, not elsewhere classified  Status post total left knee replacement  ONSET DATE: 04/01/2023 (referral date)  SUBJECTIVE:                                                                                                                                                                                            SUBJECTIVE STATEMENT: Pt states his exercises have been going well. Not really feeling any pain this morning.   Attends session with self - arrives via transportation  PERTINENT HISTORY:  L total knee arthroplasty 08/19/1922, hypertension  PAIN:  Are you having pain? Yes: NPRS scale: 5/10 Pain location: R low back Pain description: throbbing Aggravating factors: moving a lot Relieving factors: sitting down, leaning against a pillow, smoking marijuana   PRECAUTIONS: Fall   RED FLAGS: Urinary urgency - doctor aware and managing  per patient    WEIGHT BEARING RESTRICTIONS: No  FALLS:  Has patient fallen in last 6 months? No  LIVING ENVIRONMENT: Lives with:  lives with sister Lives in: House/apartment Stairs: Yes: External: 4 steps; can reach both Has following equipment at home: Single point cane, Walker - 2 wheeled, and Grab bars  OCCUPATION: Not working   PLOF:  Requires assistance for transportation from a caregiver International Aid/development Worker) but is otherwise independent    PATIENT GOALS: I want to be able to move around a lot better than I do right now.   NEXT MD VISIT: December 31 with Dr. Jerri  OBJECTIVE:   TREATMENT DATE:                                                                                                                               05/07/2023  There were no vitals filed for this visit. Therex: Nustep L5 x 5 min UEs/LEs Modified Thomas stretch x1 min Supine piriformis stretch x1 min LTR 2x30 sec Bent knee fall out x30 sec Supine PPT + marching 2x5 Sidelying clamshell red TB 2x10 Prone hip ext 3x5 (this was difficult for pt) Palloff press red TB x10 R&L PPT + wall squat x10  PATIENT EDUCATION:  Education details: Continue HEP + additions Person educated: Patient Education method: Explanation Education comprehension: verbalized understanding  HOME EXERCISE PROGRAM: Access Code:  [email or web address] URL: [email or web address] Date: 05/07/2023 Prepared by: Vyncent Overby April Earnie Starring  Exercises - Supine Piriformis Stretch with Foot on Ground  - 1 x daily - 7 x weekly - 2 sets - 30 sec hold - Seated Piriformis Stretch  - 1 x daily - 7 x weekly - 2 sets - 30 sec hold - ITB Stretch at Wall  - 1 x daily - 7 x weekly - 2 sets - 30 sec hold - Standing Anti-Rotation Press with Anchored Resistance  - 1 x daily - 7 x weekly - 2 sets - 10 reps - Supine March with Posterior Pelvic Tilt  - 1 x daily - 7 x weekly - 2 sets - 20 reps - Bent Knee Fallouts  - 1 x daily - 7 x weekly - 2 sets - 20 reps - Clamshell with Resistance  - 1 x daily - 7 x weekly - 2 sets - 10 reps - Prone Hip Extension - One Pillow  - 1 x daily - 7 x weekly - 3 sets - 5 reps  ASSESSMENT:  CLINICAL IMPRESSION: Treatment session focused on improving hip mobility and strength. Continued core strengthening for pelvic stability. Less pain today. Less hip capsular tightness today. Modified HEP to progress his hip strengthening at home.   OBJECTIVE IMPAIRMENTS: Abnormal gait, decreased balance, decreased mobility, decreased ROM, decreased strength, and pain.    GOALS: Goals reviewed with patient? Yes  SHORT TERM GOALS: Target date: 05/06/2023  Patient will demonstrate independence with initial HEP to continue to progress between physical therapy sessions.  Baseline: To be provided Goal status: INITIAL  2.  10 MWT to be assessed / LTG written Baseline: 1.14 m/s but pt demos lateral trunk lean and instability Goal status: MET 04/23/23  3.  FxSTS to be assessed / LTG written Baseline: 24 sec Goal status: MET 04/23/23   LONG TERM GOALS: Target date: 06/10/2023  Patient will report demonstrate independence with final HEP in order to maintain current gains and continue to progress after physical therapy discharge.   Baseline: To be provided Goal status: INITIAL  2.  Patient will improve modified ODI  score to 55% impairment or less to indicate a clinically important improvement in low back pain.   Baseline: 68% impairment Goal status: INITIAL  3.  10 MWT to improve to 1.2 m/s with decreased gait abnormalities Baseline: 1.14 m/s but pt demos lateral trunk lean and instability,  Goal status: INITIAL  4.  FxSTS to improve to </=15 sec to demo increased functional LE strength Baseline: 24 sec Goal status: INITIAL    PLAN:  PT FREQUENCY: 1x/week  PT DURATION: 6 weeks  PLANNED INTERVENTIONS: 97164- PT Re-evaluation, 97110-Therapeutic exercises, 97530- Therapeutic activity, 97112- Neuromuscular re-education, 97535- Self Care, 02859- Manual therapy, 97116- Gait training, Dry Needling, and Joint mobilization.  PLAN FOR NEXT SESSION: flexion based exercises for lumbar spine, hip mobility and strength work, dry needling as indicated   Shagun Wordell April Ma L Roma Bondar, PT, DPT 05/07/2023, 9:55 AM

## 2023-05-14 ENCOUNTER — Ambulatory Visit: Payer: Medicaid Other | Admitting: Physical Therapy

## 2023-05-14 NOTE — Therapy (Deleted)
OUTPATIENT PHYSICAL THERAPY THORACOLUMBAR TREATMENT   Patient Name: Marcus [name] MRN: [id number] DOB:02-21-1961, 63 y.o., male Today's Date: 05/14/2023  END OF SESSION:    Past Medical History:  Diagnosis Date   Diabetes mellitus without complication (HCC)    Hypertension    Past Surgical History:  Procedure Laterality Date   ankle surgery Left 1980   TOTAL KNEE ARTHROPLASTY Left 08/18/2021   Procedure: LEFT TOTAL KNEE ARTHROPLASTY;  Surgeon: Tarry Kos, MD;  Location: [facility] OR;  Service: Orthopedics;  Laterality: Left;   Patient Active Problem List   Diagnosis Date Noted   Primary osteoarthritis of left knee 08/18/2021   Status post total left knee replacement 08/18/2021    PCP: [name] (patient will bring in patient's full name in the future)   REFERRING PROVIDER: Tarry Kos, MD  REFERRING DIAG: [phone] (ICD-10-CM) - Status post total left knee replacement M54.41,G89.29 (ICD-10-CM) - Chronic right-sided low back pain with right-sided sciatica  Rationale for Evaluation and Treatment: Rehabilitation  THERAPY DIAG:  No diagnosis found.  ONSET DATE: 04/01/2023 (referral date)  SUBJECTIVE:                                                                                                                                                                                           SUBJECTIVE STATEMENT: *** Pt states his exercises have been going well. Not really feeling any pain this morning.   Attends session with self - arrives via transportation  PERTINENT HISTORY:  L total knee arthroplasty 08/19/1922, hypertension  PAIN:  Are you having pain? Yes: NPRS scale: 5/10 Pain location: R low back Pain description: throbbing Aggravating factors: moving a lot Relieving factors: sitting down, leaning against a pillow, smoking marijuana   PRECAUTIONS: Fall   RED FLAGS: Urinary urgency - doctor aware and managing per patient    WEIGHT BEARING RESTRICTIONS: No  FALLS:   Has patient fallen in last 6 months? No  LIVING ENVIRONMENT: Lives with:  lives with sister Lives in: House/apartment Stairs: Yes: External: 4 steps; can reach both Has following equipment at home: Single point cane, Walker - 2 wheeled, and Grab bars  OCCUPATION: Not working   PLOF:  Requires assistance for transportation from a caregiver International aid/development worker) but is otherwise independent    PATIENT GOALS: "I want to be able to move around a lot better than I do right now."   NEXT MD VISIT: December 31 with Dr. Roda Shutters  OBJECTIVE:   TREATMENT DATE:  05/14/2023  There were no vitals filed for this visit. Therex: Nustep L5 x 5 min UEs/LEs Modified Thomas stretch x1 min Supine piriformis stretch x1 min LTR 2x30 sec Bent knee fall out x30 sec Supine PPT + marching 2x5 Sidelying clamshell red TB 2x10 Prone hip ext 3x5 (this was difficult for pt) Palloff press red TB x10 R&L PPT + wall squat x10  PATIENT EDUCATION:  Education details: Continue HEP + additions Person educated: Patient Education method: Explanation Education comprehension: verbalized understanding  HOME EXERCISE PROGRAM: Access Code: [email or web address] URL: [email or web address] Date: 05/07/2023 Prepared by: Vernon Prey April Kirstie Peri  Exercises - Supine Piriformis Stretch with Foot on Ground  - 1 x daily - 7 x weekly - 2 sets - 30 sec hold - Seated Piriformis Stretch  - 1 x daily - 7 x weekly - 2 sets - 30 sec hold - ITB Stretch at Wall  - 1 x daily - 7 x weekly - 2 sets - 30 sec hold - Standing Anti-Rotation Press with Anchored Resistance  - 1 x daily - 7 x weekly - 2 sets - 10 reps - Supine March with Posterior Pelvic Tilt  - 1 x daily - 7 x weekly - 2 sets - 20 reps - Bent Knee Fallouts  - 1 x daily - 7 x weekly - 2 sets - 20 reps - Clamshell with Resistance  - 1 x daily - 7 x weekly  - 2 sets - 10 reps - Prone Hip Extension - One Pillow  - 1 x daily - 7 x weekly - 3 sets - 5 reps  ASSESSMENT:  CLINICAL IMPRESSION: Treatment session focused on improving hip mobility and strength. Continued core strengthening for pelvic stability. *** Less pain today. Less hip capsular tightness today. Modified HEP to progress his hip strengthening at home.   OBJECTIVE IMPAIRMENTS: Abnormal gait, decreased balance, decreased mobility, decreased ROM, decreased strength, and pain.    GOALS: Goals reviewed with patient? Yes  SHORT TERM GOALS: Target date: 05/06/2023  Patient will demonstrate independence with initial HEP to continue to progress between physical therapy sessions.   Baseline: To be provided Goal status: INITIAL  2.  10 MWT to be assessed / LTG written Baseline: 1.14 m/s but pt demos lateral trunk lean and instability Goal status: MET 04/23/23  3.  FxSTS to be assessed / LTG written Baseline: 24 sec Goal status: MET 04/23/23   LONG TERM GOALS: Target date: 06/10/2023  Patient will report demonstrate independence with final HEP in order to maintain current gains and continue to progress after physical therapy discharge.   Baseline: To be provided Goal status: INITIAL  2.  Patient will improve modified ODI score to 55% impairment or less to indicate a clinically important improvement in low back pain.   Baseline: 68% impairment Goal status: INITIAL  3.  10 MWT to improve to 1.2 m/s with decreased gait abnormalities Baseline: 1.14 m/s but pt demos lateral trunk lean and instability,  Goal status: INITIAL  4.  FxSTS to improve to </=15 sec to demo increased functional LE strength Baseline: 24 sec Goal status: INITIAL    PLAN:  PT FREQUENCY: 1x/week  PT DURATION: 6 weeks  PLANNED INTERVENTIONS: 97164- PT Re-evaluation, 97110-Therapeutic exercises, 97530- Therapeutic activity, 97112- Neuromuscular re-education, 97535- Self Care, 21308- Manual therapy, 97116-  Gait training, Dry Needling, and Joint mobilization.  PLAN FOR NEXT SESSION: flexion based exercises for lumbar spine, hip mobility and strength work, dry needling as indicated   [employer]  April Ma L Damen [place], PT, DPT 05/14/2023, 7:52 AM

## 2023-05-21 ENCOUNTER — Encounter: Payer: Self-pay | Admitting: Physical Therapy

## 2023-05-21 ENCOUNTER — Telehealth: Payer: Self-pay

## 2023-05-21 ENCOUNTER — Ambulatory Visit: Payer: Medicaid Other | Admitting: Physical Therapy

## 2023-05-21 VITALS — BP 183/100 | HR 69

## 2023-05-21 DIAGNOSIS — M6281 Muscle weakness (generalized): Secondary | ICD-10-CM

## 2023-05-21 DIAGNOSIS — R262 Difficulty in walking, not elsewhere classified: Secondary | ICD-10-CM

## 2023-05-21 DIAGNOSIS — M5441 Lumbago with sciatica, right side: Secondary | ICD-10-CM | POA: Diagnosis not present

## 2023-05-21 DIAGNOSIS — M5459 Other low back pain: Secondary | ICD-10-CM

## 2023-05-21 DIAGNOSIS — R269 Unspecified abnormalities of gait and mobility: Secondary | ICD-10-CM

## 2023-05-21 DIAGNOSIS — M25562 Pain in left knee: Secondary | ICD-10-CM

## 2023-05-21 NOTE — Progress Notes (Signed)
Care Guide Pharmacy Note  05/21/2023 Name: Marcus [name] MRN: [id number] DOB: 1960-09-04  Referred By: Rema Fendt, NP Reason for referral: Care Coordination (Outreach to schedule with Pharm d )   Marcus [name] is a 63 y.o. year old male who is a primary care patient of Rema Fendt, NP.  Ashtian Villacis was referred to the pharmacist for assistance related to: HTN  Successful contact was made with the patient to discuss pharmacy services including being ready for the pharmacist to call at least 5 minutes before the scheduled appointment time and to have medication bottles and any blood pressure readings ready for review. The patient agreed to meet with the pharmacist via telephone visit on (date/time).05/28/2023  Penne Lash , RMA     [place]  [facility], [facility] Guide  Direct Dial: [phone]  Website: [email or web address]

## 2023-05-21 NOTE — Therapy (Signed)
OUTPATIENT PHYSICAL THERAPY THORACOLUMBAR TREATMENT   Patient Name: Marcus [name] MRN: [id number] DOB:09/12/1960, 63 y.o., male Today's Date: 05/21/2023  END OF SESSION:  PT End of Session - 05/21/23 0824     Visit Number 5    Number of Visits 7    Date for PT Re-Evaluation 06/10/23    Authorization Type [place] Medicaid    PT Start Time 0824    PT Stop Time 0902    PT Time Calculation (min) 38 min    Equipment Utilized During Treatment Other (comment)    Activity Tolerance Patient tolerated treatment well    Behavior During Therapy WFL for tasks assessed/performed             Past Medical History:  Diagnosis Date   Diabetes mellitus without complication (HCC)    Hypertension    Past Surgical History:  Procedure Laterality Date   ankle surgery Left 1980   TOTAL KNEE ARTHROPLASTY Left 08/18/2021   Procedure: LEFT TOTAL KNEE ARTHROPLASTY;  Surgeon: Tarry Kos, MD;  Location: [facility] OR;  Service: Orthopedics;  Laterality: Left;   Patient Active Problem List   Diagnosis Date Noted   Primary osteoarthritis of left knee 08/18/2021   Status post total left knee replacement 08/18/2021    PCP: Doctor Francisca December, MD at [facility] medical  (patient will bring in patient's full name in the future)   REFERRING PROVIDER: Tarry Kos, MD  REFERRING DIAG: [phone] (ICD-10-CM) - Status post total left knee replacement M54.41,G89.29 (ICD-10-CM) - Chronic right-sided low back pain with right-sided sciatica  Rationale for Evaluation and Treatment: Rehabilitation  THERAPY DIAG:  Other low back pain  Abnormality of gait and mobility  Muscle weakness (generalized)  Difficulty in walking, not elsewhere classified  Left knee pain, unspecified chronicity  ONSET DATE: 04/01/2023 (referral date)  SUBJECTIVE:                                                                                                                                                                                            SUBJECTIVE STATEMENT: Patient reports that his hip continues to be quite sore. He continues to report improvements with a hot shower. He reports that the exercises have also felt helpful as well. Denies falls and near falls since last here. Patient reports that he has not yet taken his BP medication this morning as he usually takes it after 10.   Attends session with self - arrives via transportation  PERTINENT HISTORY:  L total knee arthroplasty 08/19/1922, hypertension  PAIN:  Are you having pain? Yes: NPRS scale: 8/10 Pain location: R low back Pain description: throbbing  Aggravating factors: moving a lot Relieving factors: sitting down, leaning against a pillow, smoking marijuana   PRECAUTIONS: Fall   RED FLAGS: Urinary urgency - doctor aware and managing per patient    WEIGHT BEARING RESTRICTIONS: No  FALLS:  Has patient fallen in last 6 months? No  LIVING ENVIRONMENT: Lives with:  lives with sister Lives in: House/apartment Stairs: Yes: External: 4 steps; can reach both Has following equipment at home: Single point cane, Walker - 2 wheeled, and Grab bars  OCCUPATION: Not working   PLOF:  Requires assistance for transportation from a caregiver International aid/development worker) but is otherwise independent    PATIENT GOALS: "I want to be able to move around a lot better than I do right now."   NEXT MD VISIT: December 31 with Dr. Roda Shutters  OBJECTIVE:   TREATMENT DATE:                                                                                                                               05/21/2023   Therex: Swiss ball green roll outs seated on EOM for lumbar flexion for pain modulation 2 x 10 reps forward, 1 x 10 reps bilateral to side  Swiss ball green seated marching for core activation and hip mobility and trunk control 2 x 10 reps with minA Swiss ball green LAQ 2 x 10 reps for core activation and LE mobility and trunk control with minA  TherAct:   Vitals:   05/21/23 0830  05/21/23 0858  BP: (!) 162/87 (!) 183/100  Pulse: 67 69     Initial       End of session  BP assessed at start of session and elevated but [facility] for therapy seated on LUE, elevated and discussed BP safety, patient is at end window before taking medication, patient does not have BP cuff at home so discussed where to get one and recommend automatic cuff, provided safe BP ranges and education and discussed patient following up with his niece who is nurse and PCP with questions, patient in agreement to following up with population health if PT sends over BP ranges   Assessed BP what would have been halfway through session as noted above but BP was even more elevated and outside of functional range for therapy so discontinued further exericise and recommend patient call PCP and go to ED if becomes symptomatic. Provided readings from today's session and patient verbalized understanding   PATIENT EDUCATION:  Education details: See above, continue HEP when BP under control, recommend follow up with PCP Person educated: Patient Education method: Explanation Education comprehension: verbalized understanding  HOME EXERCISE PROGRAM: Access Code: [email or web address] URL: [email or web address] Date: 05/21/2023 Prepared by: Maryruth Eve  Exercises - Supine Piriformis Stretch with Foot on Ground  - 1 x daily - 7 x weekly - 2 sets - 30 sec hold - Seated Piriformis Stretch  - 1 x daily - 7 x weekly - 2 sets - 30 sec hold -  ITB Stretch at Wall  - 1 x daily - 7 x weekly - 2 sets - 30 sec hold - Standing Anti-Rotation Press with Anchored Resistance  - 1 x daily - 7 x weekly - 2 sets - 10 reps - Supine March with Posterior Pelvic Tilt  - 1 x daily - 7 x weekly - 2 sets - 20 reps - Bent Knee Fallouts  - 1 x daily - 7 x weekly - 2 sets - 20 reps - Clamshell with Resistance  - 1 x daily - 7 x weekly - 2 sets - 10 reps - Prone Hip Extension - One Pillow  - 1 x daily - 7 x weekly - 3 sets - 5 reps - Seated March   - 1 x daily - 7 x weekly - 3 sets - 10 reps - Seated Knee Extension AROM  - 1 x daily - 7 x weekly - 3 sets - 10 reps  ASSESSMENT:  CLINICAL IMPRESSION: Treatment session limited by patient's elevated BP. BP initially elevated but WFL but ended session early as rechecked BP after a few exercises and increased outside of safe therapuetic range. Spent time educating on BP safety and what time available in session to complete work progressing core stability, LE and hip mobility, and trunk control. All patient's short term goals are met. Continue POC as able.   OBJECTIVE IMPAIRMENTS: Abnormal gait, decreased balance, decreased mobility, decreased ROM, decreased strength, and pain.    GOALS: Goals reviewed with patient? Yes  SHORT TERM GOALS: Target date: 05/06/2023  Patient will demonstrate independence with initial HEP to continue to progress between physical therapy sessions.   Baseline: To be provided, reports confidence in initial HEP Goal status: MET 1/242024 2.  10 MWT to be assessed / LTG written Baseline: 1.14 m/s but pt demos lateral trunk lean and instability Goal status: MET 04/23/23  3.  FxSTS to be assessed / LTG written Baseline: 24 sec Goal status: MET 04/23/23   LONG TERM GOALS: Target date: 06/10/2023  Patient will report demonstrate independence with final HEP in order to maintain current gains and continue to progress after physical therapy discharge.   Baseline: To be provided Goal status: INITIAL  2.  Patient will improve modified ODI score to 55% impairment or less to indicate a clinically important improvement in low back pain.   Baseline: 68% impairment Goal status: INITIAL  3.  10 MWT to improve to 1.2 m/s with decreased gait abnormalities Baseline: 1.14 m/s but pt demos lateral trunk lean and instability,  Goal status: INITIAL  4.  FxSTS to improve to </=15 sec to demo increased functional LE strength Baseline: 24 sec Goal status:  INITIAL    PLAN:  PT FREQUENCY: 1x/week  PT DURATION: 6 weeks  PLANNED INTERVENTIONS: 97164- PT Re-evaluation, 97110-Therapeutic exercises, 97530- Therapeutic activity, 97112- Neuromuscular re-education, 97535- Self Care, 16109- Manual therapy, 97116- Gait training, Dry Needling, and Joint mobilization.  PLAN FOR NEXT SESSION: review HEP + possible D/C as indicated or add visit for missed appointments, monitor BP - is patient checking BP and following up with PCP  Carmelia Bake, PT, DPT 05/21/2023, 9:16 AM

## 2023-05-27 ENCOUNTER — Ambulatory Visit: Payer: Medicaid Other | Admitting: Physical Therapy

## 2023-05-27 ENCOUNTER — Encounter: Payer: Self-pay | Admitting: Physical Therapy

## 2023-05-27 VITALS — BP 143/74 | HR 74

## 2023-05-27 DIAGNOSIS — M5459 Other low back pain: Secondary | ICD-10-CM | POA: Diagnosis not present

## 2023-05-27 DIAGNOSIS — R269 Unspecified abnormalities of gait and mobility: Secondary | ICD-10-CM

## 2023-05-27 DIAGNOSIS — M5441 Lumbago with sciatica, right side: Secondary | ICD-10-CM | POA: Diagnosis not present

## 2023-05-27 DIAGNOSIS — M6281 Muscle weakness (generalized): Secondary | ICD-10-CM

## 2023-05-27 DIAGNOSIS — R262 Difficulty in walking, not elsewhere classified: Secondary | ICD-10-CM

## 2023-05-27 NOTE — Therapy (Signed)
OUTPATIENT PHYSICAL THERAPY THORACOLUMBAR TREATMENT / DISCHARGE  PHYSICAL THERAPY DISCHARGE SUMMARY  Visits from Start of Care: 6  Current functional level related to goals / functional outcomes: See below, minor falls risk due to gait speed   Remaining deficits: See below, minor falls risk due to gait speed + recommend use of SPC, pain that is much better managed but still present     Education / Equipment: Continue HEP + when to return to PT   Patient agrees to discharge. Patient goals were partially met. Patient is being discharged due to being pleased with the current functional level.  Patient Name: Marcus [name] MRN: [id number] DOB:11/26/1960, 63 y.o., male Today's Date: 05/27/2023  END OF SESSION:  PT End of Session - 05/27/23 0935     Visit Number 6    Number of Visits 7    Date for PT Re-Evaluation 06/10/23    Authorization Type [place] Medicaid    PT Start Time [phone]    PT Stop Time 1000    PT Time Calculation (min) 26 min    Equipment Utilized During Treatment Gait belt    Activity Tolerance Patient tolerated treatment well    Behavior During Therapy WFL for tasks assessed/performed             Past Medical History:  Diagnosis Date   Diabetes mellitus without complication (HCC)    Hypertension    Past Surgical History:  Procedure Laterality Date   ankle surgery Left 1980   TOTAL KNEE ARTHROPLASTY Left 08/18/2021   Procedure: LEFT TOTAL KNEE ARTHROPLASTY;  Surgeon: Tarry Kos, MD;  Location: [facility] OR;  Service: Orthopedics;  Laterality: Left;   Patient Active Problem List   Diagnosis Date Noted   Primary osteoarthritis of left knee 08/18/2021   Status post total left knee replacement 08/18/2021    PCP: Doctor Francisca December, MD at [facility] medical  (patient will bring in patient's full name in the future)   REFERRING PROVIDER: Tarry Kos, MD  REFERRING DIAG: [phone] (ICD-10-CM) - Status post total left knee replacement M54.41,G89.29 (ICD-10-CM) -  Chronic right-sided low back pain with right-sided sciatica  Rationale for Evaluation and Treatment: Rehabilitation  THERAPY DIAG:  Muscle weakness (generalized)  Abnormality of gait and mobility  Difficulty in walking, not elsewhere classified  Other low back pain  ONSET DATE: 04/01/2023 (referral date)  SUBJECTIVE:                                                                                                                                                                                           SUBJECTIVE STATEMENT: Patient reports that he has  been feeling better over the last few days. He reports that he has been taking BP medication regularly. Also, reports that pain has been better as he has been stretching and walking more. Patient reports feeling ready for discharge and denies falls and near falls.   Attends session with self - arrives via transportation  PERTINENT HISTORY:  L total knee arthroplasty 08/19/1922, hypertension  PAIN:  Are you having pain? Yes: NPRS scale: 4-5/10 Pain location: R low back Pain description: throbbing Aggravating factors: moving a lot Relieving factors: sitting down, leaning against a pillow, smoking marijuana   PRECAUTIONS: Fall   RED FLAGS: Urinary urgency - doctor aware and managing per patient    WEIGHT BEARING RESTRICTIONS: No  FALLS:  Has patient fallen in last 6 months? No  LIVING ENVIRONMENT: Lives with:  lives with sister Lives in: House/apartment Stairs: Yes: External: 4 steps; can reach both Has following equipment at home: Single point cane, Walker - 2 wheeled, and Grab bars  OCCUPATION: Not working   PLOF:  Requires assistance for transportation from a caregiver International aid/development worker) but is otherwise independent    PATIENT GOALS: "I want to be able to move around a lot better than I do right now."   NEXT MD VISIT: December 31 with Dr. Roda Shutters  OBJECTIVE:   TREATMENT DATE:                                                                                                                                05/27/2023  Vitals:   05/27/23 0937  BP: (!) 143/74  Pulse: 74  Assessed seated on LUE at rest  TherAct (Goals Check):  Modified Oswestry Disability Index: 18/50 = 36.0% Impairment   [facility] PT Assessment - 05/27/23 0001       Standardized Balance Assessment   Standardized Balance Assessment Five Times Sit to Stand;10 meter walk test    Five times sit to stand comments  13.46   seconds without UE use, pain increased to 6-7/10   10 Meter Walk 1.00   m/s with SPC (modI)            PATIENT EDUCATION:  Education details: Continue HEP + when to return to PT Person educated: Patient Education method: Explanation Education comprehension: verbalized understanding  HOME EXERCISE PROGRAM: Access Code: [email or web address] URL: [email or web address] Date: 05/21/2023 Prepared by: Maryruth Eve  Exercises - Supine Piriformis Stretch with Foot on Ground  - 1 x daily - 7 x weekly - 2 sets - 30 sec hold - Seated Piriformis Stretch  - 1 x daily - 7 x weekly - 2 sets - 30 sec hold - ITB Stretch at Wall  - 1 x daily - 7 x weekly - 2 sets - 30 sec hold - Standing Anti-Rotation Press with Anchored Resistance  - 1 x daily - 7 x weekly - 2 sets - 10 reps - Supine March with Posterior Pelvic Tilt  - 1 x daily -  7 x weekly - 2 sets - 20 reps - Bent Knee Fallouts  - 1 x daily - 7 x weekly - 2 sets - 20 reps - Clamshell with Resistance  - 1 x daily - 7 x weekly - 2 sets - 10 reps - Prone Hip Extension - One Pillow  - 1 x daily - 7 x weekly - 3 sets - 5 reps - Seated March  - 1 x daily - 7 x weekly - 3 sets - 10 reps - Seated Knee Extension AROM  - 1 x daily - 7 x weekly - 3 sets - 10 reps  ASSESSMENT:  CLINICAL IMPRESSION: Patient is D/C from PT achieving 3/5 LTGs. Patient's pain notably more decreased and reports feeling better over the last several days. Show significant improvement on modified oswestry disability  index. Recommended continued compliance with HEP to continue to progress. Patient is independent with HEP.   OBJECTIVE IMPAIRMENTS: Abnormal gait, decreased balance, decreased mobility, decreased ROM, decreased strength, and pain.    GOALS: Goals reviewed with patient? Yes  SHORT TERM GOALS: Target date: 05/06/2023  Patient will demonstrate independence with initial HEP to continue to progress between physical therapy sessions.   Baseline: To be provided, reports confidence in initial HEP Goal status: MET 1/242024 2.  10 MWT to be assessed / LTG written Baseline: 1.14 m/s but pt demos lateral trunk lean and instability Goal status: MET 04/23/23  3.  FxSTS to be assessed / LTG written Baseline: 24 sec Goal status: MET 04/23/23   LONG TERM GOALS: Target date: 06/10/2023  Patient will report demonstrate independence with final HEP in order to maintain current gains and continue to progress after physical therapy discharge.   Baseline: To be provided, reports confidence in final HEP Goal status: MET  2.  Patient will improve modified ODI score to 55% impairment or less to indicate a clinically important improvement in low back pain.   Baseline: 68% impairment; 18/50 = 36.0% Impairment Goal status: MET  3.  10 MWT to improve to 1.2 m/s with decreased gait abnormalities Baseline: 1.14 m/s but pt demos lateral trunk lean and instability, 1.00 m/s with SPC mod I Goal status: NOT MET  4.  FxSTS to improve to </=15 sec to demo increased functional LE strength Baseline: 24 sec; 13.46 sec Goal status: MET  PLAN:  PT FREQUENCY: 1x/week  PT DURATION: 6 weeks  PLANNED INTERVENTIONS: 97164- PT Re-evaluation, 97110-Therapeutic exercises, 97530- Therapeutic activity, 97112- Neuromuscular re-education, 97535- Self Care, 16109- Manual therapy, 97116- Gait training, Dry Needling, and Joint mobilization.  PLAN FOR NEXT SESSION: NA - D/C with updated HEP  Carmelia Bake, PT, DPT 05/27/2023,  10:04 AM

## 2023-05-28 ENCOUNTER — Other Ambulatory Visit: Payer: Self-pay

## 2023-05-28 NOTE — Progress Notes (Signed)
   05/28/2023  Patient ID: Marcus [name], male   DOB: 1961/01/08, 63 y.o.   MRN: [id number]  Reached out to patient via telephone to follow up on elevated BP at a recent PT appointment.  Patient's PCP: Dr. Sarina Ill at [facility]  Current BP Medications: Losartan 100mg  Amlodipine 5mg   Does not have a BP monitor at home. Denies signs or symptoms of low/high BP.   Counseled on importance of med adherence, limiting sodium intake, and being mindful of caffeine. Stressed importance of continuing physical activity post PT.   Counseled on smoking cessation, provided patient with 1800quitline number.  Patient has follow up with PCP on 06/17/23.  Sherrill Raring, PharmD Clinical Pharmacist [phone]

## 2023-09-13 NOTE — Progress Notes (Signed)
 Surgical Instructions   Your procedure is scheduled on Sep 23, 2023. Report to [facility] Main Entrance "A" at 5:30 A.M., then check in with the Admitting office. Any questions or running late day of surgery: call [phone]  Questions prior to your surgery date: call [phone], Monday-Friday, 8am-4pm. If you experience any cold or flu symptoms such as cough, fever, chills, shortness of breath, etc. between now and your scheduled surgery, please notify us  at the above number.     Remember:  Do not eat after midnight the night before your surgery   You may drink clear liquids until 4:30 the morning of your surgery.   Clear liquids allowed are: Water, Non-Citrus Juices (without pulp), Carbonated Beverages, Clear Tea (no milk, honey, etc.), Black Coffee Only (NO MILK, CREAM OR POWDERED CREAMER of any kind), and Gatorade.    Take these medicines the morning of surgery with A SIP OF WATER  amLODipine  (NORVASC )   May take these medicines IF NEEDED: methocarbamol  (ROBAXIN )    One week prior to surgery, STOP taking any Aspirin  (unless otherwise instructed by your surgeon) Aleve, Naproxen, Ibuprofen, Motrin, Advil, Goody's, BC's, all herbal medications, fish oil, and non-prescription vitamins. THIS INCLUDES YOUR meloxicam  (MOBIC ).   WHAT DO I DO ABOUT MY DIABETES MEDICATION?   Do not take oral diabetes medicines metFORMIN  (GLUCOPHAGE ) (the morning of surgery.    The day of surgery, do not take other diabetes injectables, including Byetta (exenatide), Bydureon (exenatide ER), Victoza (liraglutide), or Trulicity (dulaglutide).  If your CBG is greater than 220 mg/dL, you may take  of your sliding scale (correction) dose of insulin .   HOW TO MANAGE YOUR DIABETES BEFORE AND AFTER SURGERY  Why is it important to control my blood sugar before and after surgery? Improving blood sugar levels before and after surgery helps healing and can limit problems. A way of improving blood sugar  control is eating a healthy diet by:  Eating less sugar and carbohydrates  Increasing activity/exercise  Talking with your doctor about reaching your blood sugar goals High blood sugars (greater than 180 mg/dL) can raise your risk of infections and slow your recovery, so you will need to focus on controlling your diabetes during the weeks before surgery. Make sure that the doctor who takes care of your diabetes knows about your planned surgery including the date and location.  How do I manage my blood sugar before surgery? Check your blood sugar at least 4 times a day, starting 2 days before surgery, to make sure that the level is not too high or low.  Check your blood sugar the morning of your surgery when you wake up and every 2 hours until you get to the Short Stay unit.  If your blood sugar is less than 70 mg/dL, you will need to treat for low blood sugar: Do not take insulin . Treat a low blood sugar (less than 70 mg/dL) with  cup of clear juice (cranberry or apple), 4 glucose tablets, OR glucose gel. Recheck blood sugar in 15 minutes after treatment (to make sure it is greater than 70 mg/dL). If your blood sugar is not greater than 70 mg/dL on recheck, call [phone] for further instructions. Report your blood sugar to the short stay nurse when you get to Short Stay.  If you are admitted to the hospital after surgery: Your blood sugar will be checked by the staff and you will probably be given insulin  after surgery (instead of oral diabetes medicines) to make sure you  have good blood sugar levels. The goal for blood sugar control after surgery is 80-180 mg/dL.                   Do NOT Smoke (Tobacco/Vaping) for 24 hours prior to your procedure.  If you use a CPAP at night, you may bring your mask/headgear for your overnight stay.   You will be asked to remove any contacts, glasses, piercing's, hearing aid's, dentures/partials prior to surgery. Please bring cases for these items if  needed.    Patients discharged the day of surgery will not be allowed to drive home, and someone needs to stay with them for 24 hours.  SURGICAL WAITING ROOM VISITATION Patients may have no more than 2 support people in the waiting area - these visitors may rotate.   Pre-op nurse will coordinate an appropriate time for 1 ADULT support person, who may not rotate, to accompany patient in pre-op.  Children under the age of 17 must have an adult with them who is not the patient and must remain in the main waiting area with an adult.  If the patient needs to stay at the hospital during part of their recovery, the visitor guidelines for inpatient rooms apply.  Please refer to the [facility] website for the visitor guidelines for any additional information.   If you received a COVID test during your pre-op visit  it is requested that you wear a mask when out in public, stay away from anyone that may not be feeling well and notify your surgeon if you develop symptoms. If you have been in contact with anyone that has tested positive in the last 10 days please notify you surgeon.      Pre-operative CHG Bathing Instructions   You can play a key role in reducing the risk of infection after surgery. Your skin needs to be as free of germs as possible. You can reduce the number of germs on your skin by washing with CHG (chlorhexidine  gluconate) soap before surgery. CHG is an antiseptic soap that kills germs and continues to kill germs even after washing.   DO NOT use if you have an allergy to chlorhexidine /CHG or antibacterial soaps. If your skin becomes reddened or irritated, stop using the CHG and notify one of our RNs at [phone].              TAKE A SHOWER THE NIGHT BEFORE SURGERY AND THE DAY OF SURGERY    Please keep in mind the following:  DO NOT shave, including legs and underarms, 48 hours prior to surgery.   You may shave your face before/day of surgery.  Place clean sheets on your bed  the night before surgery Use a clean washcloth (not used since being washed) for each shower. DO NOT sleep with pet's night before surgery.  CHG Shower Instructions:  Wash your face and private area with normal soap. If you choose to wash your hair, wash first with your normal shampoo.  After you use shampoo/soap, rinse your hair and body thoroughly to remove shampoo/soap residue.  Turn the water OFF and apply half the bottle of CHG soap to a CLEAN washcloth.  Apply CHG soap ONLY FROM YOUR NECK DOWN TO YOUR TOES (washing for 3-5 minutes)  DO NOT use CHG soap on face, private areas, open wounds, or sores.  Pay special attention to the area where your surgery is being performed.  If you are having back surgery, having someone wash your back  for you may be helpful. Wait 2 minutes after CHG soap is applied, then you may rinse off the CHG soap.  Pat dry with a clean towel  Put on clean pajamas    Additional instructions for the day of surgery: DO NOT APPLY any lotions, deodorants, cologne, or perfumes.   Do not wear jewelry or makeup Do not wear nail polish, gel polish, artificial nails, or any other type of covering on natural nails (fingers and toes) Do not bring valuables to the hospital. [facility] is not responsible for valuables/personal belongings. Put on clean/comfortable clothes.  Please brush your teeth.  Ask your nurse before applying any prescription medications to the skin.

## 2023-09-14 ENCOUNTER — Encounter (HOSPITAL_COMMUNITY): Payer: Self-pay

## 2023-09-14 ENCOUNTER — Encounter (HOSPITAL_COMMUNITY)
Admission: RE | Admit: 2023-09-14 | Discharge: 2023-09-14 | Disposition: A | Discharge status: Home / Self Care | Source: Ambulatory Visit | Attending: General Surgery | Admitting: General Surgery

## 2023-09-14 ENCOUNTER — Other Ambulatory Visit: Payer: Self-pay

## 2023-09-14 VITALS — BP 134/72 | HR 80 | Temp 98.4°F | Resp 17 | Ht 63.0 in | Wt 135.4 lb

## 2023-09-14 DIAGNOSIS — E119 Type 2 diabetes mellitus without complications: Secondary | ICD-10-CM | POA: Insufficient documentation

## 2023-09-14 DIAGNOSIS — I10 Essential (primary) hypertension: Secondary | ICD-10-CM | POA: Diagnosis not present

## 2023-09-14 DIAGNOSIS — Z87891 Personal history of nicotine dependence: Secondary | ICD-10-CM | POA: Insufficient documentation

## 2023-09-14 DIAGNOSIS — R9431 Abnormal electrocardiogram [ECG] [EKG]: Secondary | ICD-10-CM | POA: Insufficient documentation

## 2023-09-14 DIAGNOSIS — Z01818 Encounter for other preprocedural examination: Secondary | ICD-10-CM | POA: Insufficient documentation

## 2023-09-14 DIAGNOSIS — Z01812 Encounter for preprocedural laboratory examination: Secondary | ICD-10-CM | POA: Diagnosis present

## 2023-09-14 DIAGNOSIS — Z96652 Presence of left artificial knee joint: Secondary | ICD-10-CM | POA: Insufficient documentation

## 2023-09-14 DIAGNOSIS — Z7984 Long term (current) use of oral hypoglycemic drugs: Secondary | ICD-10-CM | POA: Diagnosis not present

## 2023-09-14 DIAGNOSIS — K4090 Unilateral inguinal hernia, without obstruction or gangrene, not specified as recurrent: Secondary | ICD-10-CM | POA: Insufficient documentation

## 2023-09-14 DIAGNOSIS — Z01810 Encounter for preprocedural cardiovascular examination: Secondary | ICD-10-CM | POA: Diagnosis present

## 2023-09-14 HISTORY — DX: Unspecified osteoarthritis, unspecified site: M19.90

## 2023-09-14 LAB — CBC
HCT: 43.1 % (ref 39.0–52.0)
Hemoglobin: 14.4 g/dL (ref 13.0–17.0)
MCH: 29.4 pg (ref 26.0–34.0)
MCHC: 33.4 g/dL (ref 30.0–36.0)
MCV: 88.1 fL (ref 80.0–100.0)
Platelets: 257 10*3/uL (ref 150–400)
RBC: 4.89 MIL/uL (ref 4.22–5.81)
RDW: 14.1 % (ref 11.5–15.5)
WBC: 7.9 10*3/uL (ref 4.0–10.5)
nRBC: 0 % (ref 0.0–0.2)

## 2023-09-14 LAB — BASIC METABOLIC PANEL WITH GFR
Anion gap: 8 (ref 5–15)
BUN: 6 mg/dL — ABNORMAL LOW (ref 8–23)
CO2: 24 mmol/L (ref 22–32)
Calcium: 9.7 mg/dL (ref 8.9–10.3)
Chloride: 106 mmol/L (ref 98–111)
Creatinine, Ser: 0.78 mg/dL (ref 0.61–1.24)
GFR, Estimated: >60 mL/min (ref >60)
Glucose, Bld: 182 mg/dL — ABNORMAL HIGH (ref 70–99)
Potassium: 4.1 mmol/L (ref 3.5–5.1)
Sodium: 138 mmol/L (ref 135–145)

## 2023-09-14 LAB — HEMOGLOBIN A1C
Hgb A1c MFr Bld: 8.6 % — ABNORMAL HIGH (ref 4.8–5.6)
Mean Plasma Glucose: 200.12 mg/dL

## 2023-09-14 LAB — GLUCOSE, CAPILLARY: Glucose-Capillary: 224 mg/dL — ABNORMAL HIGH (ref 70–99)

## 2023-09-14 NOTE — Progress Notes (Addendum)
 PCP - Kendell Pavlov  Cardiologist -   PPM/ICD - denies Device Orders - n/a Rep Notified - n/a  Chest x-ray - 01-21-21 EKG - 09-14-23 Stress Test - denies ECHO - denies Cardiac Cath - denies  Sleep Study - denies CPAP -   Fasting Blood Sugar - Blood sugar at PAT appointment 224 Checks Blood Sugar Per patient blood sugar is monitored at MD office    Blood Thinner Instructions: denies Aspirin  Instructions: n/a  ERAS Protcol - Clear liquids until 4:30 PRE-SURGERY Ensure or G2-   COVID TEST-    Anesthesia review: yes ,review EKG  Patient denies shortness of breath, fever, cough and chest pain at PAT appointment   All instructions explained to the patient, with a verbal understanding of the material. Patient agrees to go over the instructions while at home for a better understanding. Patient also instructed to self quarantine after being tested for COVID-19. The opportunity to ask questions was provided.

## 2023-09-14 NOTE — Progress Notes (Signed)
 Spoke to [place] with Dr. Elvan Hamel office made of aware A1c 8.6 states she will make MD aware

## 2023-09-15 NOTE — Anesthesia Preprocedure Evaluation (Signed)
 Anesthesia Evaluation  Patient identified by MRN, date of birth, ID band Patient awake    Reviewed: Allergy & Precautions, NPO status , Patient's Chart, lab work & pertinent test results  History of Anesthesia Complications Negative for: history of anesthetic complications  Airway Mallampati: I  TM Distance: >3 FB Neck ROM: Full    Dental  (+) Edentulous Upper, Edentulous Lower, Dental Advisory Given   Pulmonary neg shortness of breath, neg COPD, neg recent URI, Current Smoker and Patient abstained from smoking.   + rhonchi        Cardiovascular hypertension, Pt. on medications (-) angina (-) Past MI and (-) CHF  Rhythm:Regular     Neuro/Psych negative neurological ROS  negative psych ROS   GI/Hepatic negative GI ROS, Neg liver ROS,,,  Endo/Other  diabetes    Renal/GU negative Renal ROSLab Results      Component                Value               Date                      NA                       138                 09/14/2023                K                        4.1                 09/14/2023                CO2                      24                  09/14/2023                GLUCOSE                  182 (H)             09/14/2023                BUN                      6 (L)               09/14/2023                CREATININE               0.78                09/14/2023                CALCIUM                   9.7                 09/14/2023                EGFR                     104  02/04/2022                GFRNONAA                 >60                 09/14/2023                Musculoskeletal  (+) Arthritis ,    Abdominal   Peds  Hematology negative hematology ROS (+) Lab Results      Component                Value               Date                      WBC                      7.9                 09/14/2023                HGB                      14.4                09/14/2023                 HCT                      43.1                09/14/2023                MCV                      88.1                09/14/2023                PLT                      257                 09/14/2023              Anesthesia Other Findings   Reproductive/Obstetrics                             Anesthesia Physical Anesthesia Plan  ASA: 2  Anesthesia Plan: General and Regional   Post-op Pain Management: Toradol  IV (intra-op)*   Induction: Intravenous  PONV Risk Score and Plan: 2 and Ondansetron  and Dexamethasone   Airway Management Planned: Oral ETT  Additional Equipment: None  Intra-op Plan:   Post-operative Plan: Extubation in OR  Informed Consent: I have reviewed the patients History and Physical, chart, labs and discussed the procedure including the risks, benefits and alternatives for the proposed anesthesia with the patient or authorized representative who has indicated his/her understanding and acceptance.     Dental advisory given  Plan Discussed with: CRNA  Anesthesia Plan Comments: (PAT note written 09/15/2023 by Skylor Hughson, PA-C.  )       Anesthesia Quick Evaluation

## 2023-09-15 NOTE — Progress Notes (Signed)
 Anesthesia Chart Review:  Case: [id number] Date/Time: 09/23/23 0715   Procedure: REPAIR, HERNIA, INGUINAL, ADULT (Left) - GEN/TAP BLOCK   Anesthesia type: General   Diagnosis: Inguinal hernia without obstruction or gangrene, recurrence not specified, unspecified laterality [K40.90]   Pre-op diagnosis: LEFT INGUINAL HERNIA   [facility] RO[facility] 08 / [facility]   Surgeons: Aldean Hummingbird, MD       DISCUSSION: Patient is a 63 year old male scheduled for the above procedure.   History includes smoking, HTN, DM2, osteoarthritis (left TKA 08/18/21), inguinal hernia.  A1c 8.6%. Jenette Mitchell at [facility] office notified and will have surgeon review.  He is on metformin  500 mg daily. He does not check home CBGs.  Anesthesia team to evaluate on the day of surgery. CBG on arrival.   VS: BP 134/72   Pulse 80   Temp 36.9 C   Resp 17   Ht 5\' 3"  (1.6 m)   Wt 61.4 kg   SpO2 99%   BMI 23.99 kg/m   PROVIDERS: Senaida Dama, NP is PCP    LABS: Preoperative labs noted. See DISCUSSION. (all labs ordered are listed, but only abnormal results are displayed)  Labs Reviewed  GLUCOSE, CAPILLARY - Abnormal; Notable for the following components:      Result Value   Glucose-Capillary 224 (*)    All other components within normal limits  HEMOGLOBIN A1C - Abnormal; Notable for the following components:   Hgb A1c MFr Bld 8.6 (*)    All other components within normal limits  BASIC METABOLIC PANEL WITH GFR - Abnormal; Notable for the following components:   Glucose, Bld 182 (*)    BUN 6 (*)    All other components within normal limits  CBC    EKG: 09/14/23: Normal sinus rhythm Septal infarct , age undetermined Abnormal ECG When compared with ECG of 07-Aug-2021 11:57, No significant change was found Confirmed by Carson Clara [phone]) on 09/14/2023 9:06:02 PM  CV: N/A  Past Medical History:  Diagnosis Date   Arthritis    Diabetes mellitus without complication (HCC)    Hypertension     Past  Surgical History:  Procedure Laterality Date   ankle surgery Left 1980   TOTAL KNEE ARTHROPLASTY Left 08/18/2021   Procedure: LEFT TOTAL KNEE ARTHROPLASTY;  Surgeon: Wes Hamman, MD;  Location: [facility];  Service: Orthopedics;  Laterality: Left;    MEDICATIONS:  amLODipine  (NORVASC ) 5 MG tablet   FLUoxetine  (PROZAC ) 10 MG capsule   losartan (COZAAR) 100 MG tablet   meloxicam  (MOBIC ) 7.5 MG tablet   metFORMIN  (GLUCOPHAGE ) 500 MG tablet   methocarbamol  (ROBAXIN ) 500 MG tablet   No current facility-administered medications for this encounter.    Ella Gun, PA-C Surgical Short Stay/Anesthesiology [facility] Phone [phone] [facility] Phone [phone] 09/15/2023 3:01 PM

## 2023-09-16 ENCOUNTER — Ambulatory Visit: Payer: Self-pay | Admitting: General Surgery

## 2023-09-22 NOTE — H&P (Signed)
 REFERRING PHYSICIAN: Kahoano, Haku H  PROVIDER: Vidyuth Belsito Veldon German, MD  MRN: [id number] DOB: 08/24/1960 DATE OF ENCOUNTER: 08/26/2023  Subjective   Chief Complaint: New Patient (Left inguinal heria )   History of Present Illness: Marcus [name] is a 63 y.o. male who is seen today as an office consultation at the request of Dr. Kahoano for evaluation of New Patient (Left inguinal heria ) . History of Present Illness Corliss Lamartina is a 63 year old male who presents with a left inguinal hernia.  He has noticed a bulge on his left side for approximately three months. The bulge is intermittent, sometimes increasing in size and then reducing. No sharp burning pain, dull ache, or pulling sensation in the groin area. No issues with urination, nausea, vomiting, or irregular bowel movements.  His past medical history includes diabetes, hypertension, hyperlipidemia, and chronic back pain. He takes medication for these conditions. He has not had any prior abdominal surgeries but has undergone a knee replacement.  He smokes about half a pack of cigarettes per day.  No chest pain, chest pressure, or shortness of breath while at rest or during activities such as walking.    Review of Systems: A complete review of systems was obtained from the patient. I have reviewed this information and discussed as appropriate with the patient. See HPI as well for other ROS.  ROS  Medical History: Past Medical History: Diagnosis Date Arthritis Diabetes mellitus without complication (CMS/HHS-HCC) Hyperlipidemia Hypertension  There is no problem list on file for this patient.  Past Surgical History: Procedure Laterality Date REPLACEMENT TOTAL KNEE 08/23/2020   No Known Allergies  Current Outpatient Medications on File Prior to Visit Medication Sig Dispense Refill amLODIPine  (NORVASC ) 5 MG tablet Take 5 mg by mouth once daily atorvastatin  (LIPITOR) 20 MG tablet Take 20 mg by mouth once  daily FLUoxetine  (PROZAC ) 10 MG capsule Take 10 mg by mouth once daily HYDROcodone -acetaminophen  (NORCO) 5-325 mg tablet Take 1 tablet by mouth every 6 (six) hours as needed for Pain losartan (COZAAR) 100 MG tablet Take 100 mg by mouth once daily meloxicam  (MOBIC ) 7.5 MG tablet Take 7.5 mg by mouth once daily metFORMIN  (GLUCOPHAGE ) 500 MG tablet Take 500 mg by mouth 2 (two) times daily with meals methocarbamoL  (ROBAXIN ) 500 MG tablet Take 500 mg by mouth 4 (four) times daily  No current facility-administered medications on file prior to visit.  History reviewed. No pertinent family history.  Social History  Tobacco Use Smoking Status Every Day Types: Cigarettes Smokeless Tobacco Never   Social History  Socioeconomic History Marital status: Single Tobacco Use Smoking status: Every Day Types: Cigarettes Smokeless tobacco: Never Substance and Sexual Activity Alcohol use: Never Drug use: Never  Objective:  Vitals: 08/26/23 0846 BP: 126/86 Pulse: 74 Temp: 36.8 C (98.2 F) SpO2: 98% Weight: 62.3 kg (137 lb 6.4 oz) Height: 162.6 cm (5\' 4" ) PainSc: 0-No pain  Body mass index is 23.58 kg/m.  Constitutional: NAD; conversant; no deformities Eyes: Moist conjunctiva; no lid lag; anicteric; PERRL Neck: Trachea midline; no thyromegaly Lungs: Normal respiratory effort; no tactile fremitus CV: RRR; no palpable thrills; no pitting edema GI: Abd soft, nt, nd; no palpable hepatosplenomegaly GU: bulge L groin, reducible; no bulge Rt groin with valsalva MSK: Normal gait; no clubbing/cyanosis Psychiatric: Appropriate affect; alert and oriented x3 Lymphatic: No palpable cervical or axillary lymphadenopathy Skin:n/a  Labs, Imaging and Diagnostic Testing: Reviewed PCP note from March 28 along with labs from March 28 that showed a hemoglobin A1c of  8.8, dyslipidemia, normal CBC Assessment and Plan:   Diagnoses and all orders for this visit:  Left inguinal  hernia    Assessment & Plan Left inguinal hernia Left inguinal hernia present for approximately three months with intermittent bulging. No sharp burning pain, dull ache, or urinary symptoms. No prior abdominal surgeries. The hernia is reducible and not currently incarcerated or strangulated. Discussed the risk of incarceration and strangulation, which would require emergency surgery. Surgery is recommended to repair the hernia before complications arise. Explained the surgical procedure, including the use of mesh to cover the defect, and the risks such as bleeding, infection, injury to surrounding structures, recurrence, and mesh infection. The risk of injuring the blood supply to the testicle is about 1%, and the lifetime risk of recurrence is around 8-10%. Mesh infection risk is less than 1%. Postoperative care includes activity restrictions and pain management options. He prefers to schedule surgery after attending a family event on May 10th. - Schedule surgery for left inguinal hernia repair after May 10th. - Discuss postoperative pain management with primary care provider. - Instruct on activity restrictions post-surgery: no lifting over 10-15 pounds for one month.  We discussed the etiology of inguinal hernias. We discussed the signs & symptoms of incarceration & strangulation. We discussed non-operative and operative management. We discussed open and  The patient has elected open repair of Left inguinal hernia repair with mesh  I described the procedure in detail. The patient was given educational material. We discussed the risks and benefits including but not limited to bleeding, infection, chronic inguinal pain, nerve entrapment, hernia recurrence, mesh complications, hematoma formation, urinary retention, injury to the testicles, numbness in the groin, blood clots, injury to the surrounding structures, and anesthesia risk. We also discussed the typical post operative recovery course,  including no heavy lifting for 4-6 weeks. I explained that the likelihood of improvement of their symptoms is good  Chronic back pain Chronic back pain managed with pain medication prescribed by primary care provider. Current medication is not effective. Under a pain contract with primary care provider. - Continue current pain management plan with primary care provider.  Type 2 diabetes mellitus Type 2 diabetes mellitus managed with medication.  Hypertension Hypertension managed with medication.  Hyperlipidemia Hyperlipidemia managed with medication.  Follow-up Follow-up in the clinic approximately four weeks post-surgery. - Schedule follow-up appointment four weeks after surgery. This note has been created using automated tools and reviewed for accuracy by Tova Vater MCADAMS Ramal Eckhardt.  This patient encounter took 30 minutes today to perform the following: take history, perform exam, review outside records, interpret imaging, counsel the patient on their diagnosis and document encounter, findings & plan in the EHR  No follow-ups on file.  Zimir Kittleson Veldon German, MD General, Minimally Invasive, & Bariatric Surgery     Electronically signed by Georgeanne King, MD at 08/26/2023 9:09 AM EDT

## 2023-09-23 ENCOUNTER — Encounter (HOSPITAL_COMMUNITY)
Admission: RE | Disposition: A | Discharge status: Home / Self Care | Payer: Self-pay | Source: Home / Self Care | Attending: General Surgery

## 2023-09-23 ENCOUNTER — Ambulatory Visit (HOSPITAL_COMMUNITY)
Admission: RE | Admit: 2023-09-23 | Discharge: 2023-09-23 | Disposition: A | Discharge status: Home / Self Care | Attending: General Surgery | Admitting: General Surgery

## 2023-09-23 ENCOUNTER — Other Ambulatory Visit: Payer: Self-pay

## 2023-09-23 ENCOUNTER — Ambulatory Visit (HOSPITAL_COMMUNITY): Admitting: Anesthesiology

## 2023-09-23 ENCOUNTER — Ambulatory Visit (HOSPITAL_COMMUNITY): Payer: Self-pay | Admitting: Vascular Surgery

## 2023-09-23 ENCOUNTER — Encounter (HOSPITAL_COMMUNITY): Payer: Self-pay | Admitting: General Surgery

## 2023-09-23 DIAGNOSIS — Z79899 Other long term (current) drug therapy: Secondary | ICD-10-CM | POA: Insufficient documentation

## 2023-09-23 DIAGNOSIS — Z7984 Long term (current) use of oral hypoglycemic drugs: Secondary | ICD-10-CM | POA: Diagnosis not present

## 2023-09-23 DIAGNOSIS — M1990 Unspecified osteoarthritis, unspecified site: Secondary | ICD-10-CM | POA: Insufficient documentation

## 2023-09-23 DIAGNOSIS — E119 Type 2 diabetes mellitus without complications: Secondary | ICD-10-CM | POA: Insufficient documentation

## 2023-09-23 DIAGNOSIS — K4090 Unilateral inguinal hernia, without obstruction or gangrene, not specified as recurrent: Secondary | ICD-10-CM | POA: Diagnosis present

## 2023-09-23 DIAGNOSIS — E785 Hyperlipidemia, unspecified: Secondary | ICD-10-CM | POA: Diagnosis not present

## 2023-09-23 DIAGNOSIS — I10 Essential (primary) hypertension: Secondary | ICD-10-CM | POA: Insufficient documentation

## 2023-09-23 DIAGNOSIS — M549 Dorsalgia, unspecified: Secondary | ICD-10-CM | POA: Diagnosis not present

## 2023-09-23 DIAGNOSIS — G8929 Other chronic pain: Secondary | ICD-10-CM | POA: Diagnosis not present

## 2023-09-23 DIAGNOSIS — F17210 Nicotine dependence, cigarettes, uncomplicated: Secondary | ICD-10-CM | POA: Insufficient documentation

## 2023-09-23 HISTORY — PX: INGUINAL HERNIA REPAIR: SHX194

## 2023-09-23 LAB — GLUCOSE, CAPILLARY
Glucose-Capillary: 187 mg/dL — ABNORMAL HIGH (ref 70–99)
Glucose-Capillary: 246 mg/dL — ABNORMAL HIGH (ref 70–99)

## 2023-09-23 SURGERY — REPAIR, HERNIA, INGUINAL, ADULT
Anesthesia: Regional | Laterality: Left

## 2023-09-23 MED ORDER — TRAMADOL HCL 50 MG PO TABS: 50.0000 mg | ORAL_TABLET | Freq: Four times a day (QID) | ORAL | 0 refills | Status: AC | PRN

## 2023-09-23 MED ORDER — LIDOCAINE-EPINEPHRINE (PF) 1.5 %-1:200000 IJ SOLN
INTRAMUSCULAR | Status: DC | PRN
  Administered 2023-09-23: 5 mL via PERINEURAL

## 2023-09-23 MED ORDER — ACETAMINOPHEN 500 MG PO TABS: 1000.0000 mg | ORAL_TABLET | Freq: Three times a day (TID) | ORAL | Status: AC

## 2023-09-23 MED ORDER — DEXMEDETOMIDINE HCL IN NACL 80 MCG/20ML IV SOLN
INTRAVENOUS | Status: DC | PRN
  Administered 2023-09-23: 8 ug via INTRAVENOUS
  Administered 2023-09-23: 4 ug via INTRAVENOUS

## 2023-09-23 MED ORDER — BUPIVACAINE-EPINEPHRINE 0.25% -1:200000 IJ SOLN
INTRAMUSCULAR | Status: DC | PRN
  Administered 2023-09-23: 15 mL

## 2023-09-23 MED ORDER — LIDOCAINE 2% (20 MG/ML) 5 ML SYRINGE
INTRAMUSCULAR | Status: AC
  Filled 2023-09-23: qty 5

## 2023-09-23 MED ORDER — ONDANSETRON HCL 4 MG/2ML IJ SOLN
INTRAMUSCULAR | Status: AC
  Filled 2023-09-23: qty 2

## 2023-09-23 MED ORDER — SUGAMMADEX SODIUM 200 MG/2ML IV SOLN
INTRAVENOUS | Status: DC | PRN
  Administered 2023-09-23: 200 mg via INTRAVENOUS

## 2023-09-23 MED ORDER — SODIUM CHLORIDE 0.9% FLUSH: 3.0000 mL | INTRAVENOUS | Status: DC | PRN

## 2023-09-23 MED ORDER — FENTANYL CITRATE (PF) 100 MCG/2ML IJ SOLN
INTRAMUSCULAR | Status: AC
  Filled 2023-09-23: qty 2

## 2023-09-23 MED ORDER — ROCURONIUM BROMIDE 10 MG/ML (PF) SYRINGE
PREFILLED_SYRINGE | INTRAVENOUS | Status: DC | PRN
  Administered 2023-09-23: 50 mg via INTRAVENOUS

## 2023-09-23 MED ORDER — CHLORHEXIDINE GLUCONATE CLOTH 2 % EX PADS: 6.0000 | MEDICATED_PAD | Freq: Once | CUTANEOUS | Status: DC

## 2023-09-23 MED ORDER — ACETAMINOPHEN 10 MG/ML IV SOLN
1000.0000 mg | Freq: Once | INTRAVENOUS | Status: DC | PRN
  Administered 2023-09-23: 1000 mg via INTRAVENOUS

## 2023-09-23 MED ORDER — FENTANYL CITRATE (PF) 100 MCG/2ML IJ SOLN
25.0000 ug | INTRAMUSCULAR | Status: DC | PRN
  Administered 2023-09-23 (×2): 50 ug via INTRAVENOUS
  Administered 2023-09-23 (×2): 25 ug via INTRAVENOUS

## 2023-09-23 MED ORDER — OXYCODONE HCL 5 MG PO TABS
5.0000 mg | ORAL_TABLET | Freq: Once | ORAL | Status: AC | PRN
  Administered 2023-09-23: 5 mg via ORAL

## 2023-09-23 MED ORDER — OXYCODONE HCL 5 MG PO TABS
ORAL_TABLET | ORAL | Status: AC
  Filled 2023-09-23: qty 1

## 2023-09-23 MED ORDER — PHENYLEPHRINE 80 MCG/ML (10ML) SYRINGE FOR IV PUSH (FOR BLOOD PRESSURE SUPPORT)
PREFILLED_SYRINGE | INTRAVENOUS | Status: AC
  Filled 2023-09-23: qty 10

## 2023-09-23 MED ORDER — SUCCINYLCHOLINE CHLORIDE 200 MG/10ML IV SOSY
PREFILLED_SYRINGE | INTRAVENOUS | Status: AC
  Filled 2023-09-23: qty 10

## 2023-09-23 MED ORDER — KETOROLAC TROMETHAMINE 30 MG/ML IJ SOLN
INTRAMUSCULAR | Status: DC | PRN
  Administered 2023-09-23: 30 mg via INTRAVENOUS

## 2023-09-23 MED ORDER — CEFAZOLIN SODIUM-DEXTROSE 2-4 GM/100ML-% IV SOLN
2.0000 g | INTRAVENOUS | Status: AC
  Administered 2023-09-23: 2 g via INTRAVENOUS
  Filled 2023-09-23: qty 100

## 2023-09-23 MED ORDER — PROPOFOL 10 MG/ML IV BOLUS
INTRAVENOUS | Status: AC
  Filled 2023-09-23: qty 20

## 2023-09-23 MED ORDER — FENTANYL CITRATE (PF) 250 MCG/5ML IJ SOLN
INTRAMUSCULAR | Status: AC
Start: 2023-09-23 — End: ?
  Filled 2023-09-23: qty 5

## 2023-09-23 MED ORDER — PROPOFOL 500 MG/50ML IV EMUL
INTRAVENOUS | Status: DC | PRN
  Administered 2023-09-23: 125 ug/kg/min via INTRAVENOUS

## 2023-09-23 MED ORDER — SODIUM CHLORIDE 0.9% FLUSH: 3.0000 mL | Freq: Two times a day (BID) | INTRAVENOUS | Status: DC

## 2023-09-23 MED ORDER — ROCURONIUM BROMIDE 10 MG/ML (PF) SYRINGE
PREFILLED_SYRINGE | INTRAVENOUS | Status: AC
  Filled 2023-09-23: qty 10

## 2023-09-23 MED ORDER — BUPIVACAINE-EPINEPHRINE (PF) 0.5% -1:200000 IJ SOLN
INTRAMUSCULAR | Status: DC | PRN
Start: 2023-09-23 — End: 2023-09-23
  Administered 2023-09-23: 25 mL via PERINEURAL

## 2023-09-23 MED ORDER — KETOROLAC TROMETHAMINE 30 MG/ML IJ SOLN
INTRAMUSCULAR | Status: AC
Start: 2023-09-23 — End: ?
  Filled 2023-09-23: qty 1

## 2023-09-23 MED ORDER — OXYCODONE HCL 5 MG/5ML PO SOLN: 5.0000 mg | Freq: Once | ORAL | Status: AC | PRN

## 2023-09-23 MED ORDER — FENTANYL CITRATE (PF) 250 MCG/5ML IJ SOLN
INTRAMUSCULAR | Status: DC | PRN
  Administered 2023-09-23 (×2): 75 ug via INTRAVENOUS

## 2023-09-23 MED ORDER — ACETAMINOPHEN 500 MG PO TABS
1000.0000 mg | ORAL_TABLET | ORAL | Status: DC
  Filled 2023-09-23: qty 2

## 2023-09-23 MED ORDER — LACTATED RINGERS IV SOLN
INTRAVENOUS | Status: DC | PRN
Start: 2023-09-23 — End: 2023-09-23

## 2023-09-23 MED ORDER — GLYCOPYRROLATE PF 0.2 MG/ML IJ SOSY
PREFILLED_SYRINGE | INTRAMUSCULAR | Status: AC
  Filled 2023-09-23: qty 1

## 2023-09-23 MED ORDER — BUPIVACAINE-EPINEPHRINE (PF) 0.25% -1:200000 IJ SOLN
INTRAMUSCULAR | Status: AC
  Filled 2023-09-23: qty 30

## 2023-09-23 MED ORDER — PROPOFOL 10 MG/ML IV BOLUS
INTRAVENOUS | Status: DC | PRN
  Administered 2023-09-23: 140 mg via INTRAVENOUS

## 2023-09-23 MED ORDER — 0.9 % SODIUM CHLORIDE (POUR BTL) OPTIME
TOPICAL | Status: DC | PRN
  Administered 2023-09-23: 1000 mL

## 2023-09-23 MED ORDER — EPHEDRINE 5 MG/ML INJ
INTRAVENOUS | Status: AC
  Filled 2023-09-23: qty 5

## 2023-09-23 MED ORDER — MIDAZOLAM HCL 2 MG/2ML IJ SOLN
INTRAMUSCULAR | Status: AC
  Filled 2023-09-23: qty 2

## 2023-09-23 MED ORDER — ORAL CARE MOUTH RINSE: 15.0000 mL | Freq: Once | OROMUCOSAL | Status: AC

## 2023-09-23 MED ORDER — ONDANSETRON HCL 4 MG/2ML IJ SOLN
INTRAMUSCULAR | Status: DC | PRN
  Administered 2023-09-23: 4 mg via INTRAVENOUS

## 2023-09-23 MED ORDER — CHLORHEXIDINE GLUCONATE 0.12 % MT SOLN
15.0000 mL | Freq: Once | OROMUCOSAL | Status: AC
  Administered 2023-09-23: 15 mL via OROMUCOSAL
  Filled 2023-09-23: qty 15

## 2023-09-23 SURGICAL SUPPLY — 37 items
BAG COUNTER SPONGE SURGICOUNT (BAG) ×1 IMPLANT
BENZOIN TINCTURE PRP APPL 2/3 (GAUZE/BANDAGES/DRESSINGS) ×1 IMPLANT
BLADE CLIPPER SURG (BLADE) IMPLANT
CANISTER SUCTION 3000ML PPV (SUCTIONS) IMPLANT
CHLORAPREP W/TINT 26 (MISCELLANEOUS) ×1 IMPLANT
COVER SURGICAL LIGHT HANDLE (MISCELLANEOUS) ×1 IMPLANT
DERMABOND ADVANCED .7 DNX12 (GAUZE/BANDAGES/DRESSINGS) IMPLANT
DRAIN PENROSE .5X12 LATEX STL (DRAIN) IMPLANT
DRAPE LAPAROTOMY 100X72 PEDS (DRAPES) ×1 IMPLANT
DRAPE LAPAROTOMY TRNSV 102X78 (DRAPES) IMPLANT
DRSG TEGADERM 4X4.75 (GAUZE/BANDAGES/DRESSINGS) ×1 IMPLANT
ELECTRODE REM PT RTRN 9FT ADLT (ELECTROSURGICAL) ×1 IMPLANT
GAUZE SPONGE 4X4 12PLY STRL (GAUZE/BANDAGES/DRESSINGS) ×1 IMPLANT
GLOVE PI ORTHO PRO STRL 7.5 (GLOVE) ×1 IMPLANT
GOWN STRL REUS W/ TWL LRG LVL3 (GOWN DISPOSABLE) ×1 IMPLANT
GOWN STRL REUS W/ TWL XL LVL3 (GOWN DISPOSABLE) ×1 IMPLANT
KIT BASIN OR (CUSTOM PROCEDURE TRAY) ×1 IMPLANT
KIT TURNOVER KIT B (KITS) ×1 IMPLANT
MESH BARD SOFT 3X6IN (Mesh General) IMPLANT
NDL HYPO 25GX1X1/2 BEV (NEEDLE) ×1 IMPLANT
NEEDLE HYPO 25GX1X1/2 BEV (NEEDLE) ×1 IMPLANT
NS IRRIG 1000ML POUR BTL (IV SOLUTION) ×1 IMPLANT
PACK GENERAL/GYN (CUSTOM PROCEDURE TRAY) ×1 IMPLANT
PAD ARMBOARD POSITIONER FOAM (MISCELLANEOUS) ×1 IMPLANT
PENCIL SMOKE EVACUATOR (MISCELLANEOUS) ×1 IMPLANT
SPECIMEN JAR SMALL (MISCELLANEOUS) IMPLANT
SPONGE INTESTINAL PEANUT (DISPOSABLE) IMPLANT
STRIP CLOSURE SKIN 1/2X4 (GAUZE/BANDAGES/DRESSINGS) ×1 IMPLANT
SUT MNCRL AB 4-0 PS2 18 (SUTURE) ×1 IMPLANT
SUT PROLENE 2 0 CT2 30 (SUTURE) ×2 IMPLANT
SUT VIC AB 2-0 CT1 36 (SUTURE) IMPLANT
SUT VIC AB 2-0 SH 27XBRD (SUTURE) ×1 IMPLANT
SUT VIC AB 3-0 SH 18 (SUTURE) ×1 IMPLANT
SUT VICRYL AB 3 0 TIES (SUTURE) ×1 IMPLANT
SYR CONTROL 10ML LL (SYRINGE) ×1 IMPLANT
TOWEL GREEN STERILE (TOWEL DISPOSABLE) ×1 IMPLANT
TOWEL GREEN STERILE FF (TOWEL DISPOSABLE) ×2 IMPLANT

## 2023-09-23 NOTE — Anesthesia Procedure Notes (Signed)
 Procedure Name: Intubation Date/Time: 09/23/2023 7:47 AM  Performed by: Marcelle Sergeant, CRNAPre-anesthesia Checklist: Patient identified, Emergency Drugs available, Suction available and Patient being monitored Patient Re-evaluated:Patient Re-evaluated prior to induction Oxygen Delivery Method: Circle system utilized Preoxygenation: Pre-oxygenation with 100% oxygen Induction Type: IV induction Ventilation: Mask ventilation without difficulty Laryngoscope Size: 4 and Mac Grade View: Grade II Tube type: Oral Tube size: 7.5 mm Number of attempts: 2 (first attempt co2 monitor disconnected; was most likley in correct placement;) Airway Equipment and Method: Stylet Placement Confirmation: ETT inserted through vocal cords under direct vision, positive ETCO2 and breath sounds checked- equal and bilateral Secured at: 23 cm Tube secured with: Tape Dental Injury: Teeth and Oropharynx as per pre-operative assessment  Comments: Large epiglottis partially obstructs view; can see better c cricoid

## 2023-09-23 NOTE — Interval H&P Note (Signed)
 History and Physical Interval Note:  09/23/2023 7:27 AM  Marcus [name]  has presented today for surgery, with the diagnosis of LEFT INGUINAL HERNIA.  The various methods of treatment have been discussed with the patient and family. After consideration of risks, benefits and other options for treatment, the patient has consented to  Procedure(s) with comments: REPAIR, HERNIA, INGUINAL, ADULT (Left) - GEN/TAP BLOCK as a surgical intervention.  The patient's history has been reviewed, patient examined, no change in status, stable for surgery.  I have reviewed the patient's chart and labs.  Questions were answered to the patient's satisfaction.     Aldean Hummingbird

## 2023-09-23 NOTE — Anesthesia Procedure Notes (Signed)
 Anesthesia Regional Block: TAP block   Pre-Anesthetic Checklist: , timeout performed,  Correct Patient, Correct Site, Correct Laterality,  Correct Procedure, Correct Position, site marked,  Risks and benefits discussed,  Surgical consent,  Pre-op evaluation,  At surgeon's request and post-op pain management  Laterality: Left and Upper  Prep: chloraprep       Needles:  Injection technique: Single-shot      Needle Length: 9cm  Needle Gauge: 22     Additional Needles: Arrow StimuQuik ECHO Echogenic Stimulating PNB Needle  Procedures:,,,, ultrasound used (permanent image in chart),,    Narrative:  Start time: 09/23/2023 7:41 AM End time: 09/23/2023 7:46 AM Injection made incrementally with aspirations every 5 mL.  Performed by: Personally  Anesthesiologist: Arvie Latus, MD

## 2023-09-23 NOTE — Discharge Instructions (Signed)
 UMBILICAL OR INGUINAL HERNIA REPAIR: POST OP INSTRUCTIONS  Always review your discharge instruction sheet given to you by the facility where your surgery was performed. IF YOU HAVE DISABILITY OR FAMILY LEAVE FORMS, YOU MUST BRING THEM TO THE OFFICE FOR PROCESSING.   DO NOT GIVE THEM TO YOUR DOCTOR.  A  prescription for pain medication may be given to you upon discharge.  Take your pain medication as prescribed, if needed.  If narcotic pain medicine is not needed, then you may take acetaminophen  (Tylenol ) or ibuprofen (Advil) as needed. Take your usually prescribed medications unless otherwise directed. If you need a refill on your pain medication, please contact your pharmacy.  They will contact our office to request authorization. Prescriptions will not be filled after 5 pm or on week-ends. You should follow a light diet the first 24 hours after arrival home, such as soup and crackers, etc.  Be sure to include lots of fluids daily.  Resume your normal diet the day after surgery. Most patients will experience some swelling and bruising around the umbilicus or in the groin and scrotum.  Ice packs and reclining will help.  Swelling and bruising can take several days to resolve.  It is common to experience some constipation if taking pain medication after surgery.  Increasing fluid intake and taking a stool softener (such as Colace) will usually help or prevent this problem from occurring.  A mild laxative (Milk of Magnesia or Miralax) should be taken according to package directions if there are no bowel movements after 48 hours. Unless discharge instructions indicate otherwise, you may remove your bandages 24-48 hours after surgery, and you may shower at that time.  You may have steri-strips (small skin tapes) in place directly over the incision.  These strips should be left on the skin for 7-10 days.  If your surgeon used skin glue on the incision, you may shower in 24 hours.  The glue will flake off  over the next 2-3 weeks.  Any sutures or staples will be removed at the office during your follow-up visit. ACTIVITIES:  You may resume regular (light) daily activities beginning the next day--such as daily self-care, walking, climbing stairs--gradually increasing activities as tolerated.  You may have sexual intercourse when it is comfortable.  Refrain from any heavy lifting or straining until approved by your doctor. You may drive when you are no longer taking prescription pain medication, you can comfortably wear a seatbelt, and you can safely maneuver your car and apply brakes. RETURN TO WORK:  You should see your doctor in the office for a follow-up appointment approximately 2-3 weeks after your surgery.  Make sure that you call for this appointment within a day or two after you arrive home to insure a convenient appointment time. OTHER INSTRUCTIONS: Do not lift, push, or pull anything greater than 10 pounds for 1 month   WHEN TO CALL YOUR DOCTOR: Fever over 101.0 Inability to urinate Nausea and/or vomiting Extreme swelling or bruising Continued bleeding from incision. Increased pain, redness, or drainage from the incision  The clinic staff is available to answer your questions during regular business hours.  Please don't hesitate to call and ask to speak to one of the nurses for clinical concerns.  If you have a medical emergency, go to the nearest emergency room or call 911.  A surgeon from [facility] is always on call at the hospital   [address], [address], [place], [place]  [address] ?  [id number], [place], [place]   [address] [phone] ? [phone] ? FAX [phone] Web site: [email or web address]

## 2023-09-23 NOTE — Op Note (Signed)
 Open Left Direct Inguinal Hernia Repair with Mesh Procedure Note  Indications: The patient presented with a history of a left, reducible hernia.    Pre-operative Diagnosis: left reducible inguinal hernia  Post-operative Diagnosis: left direct inguinal hernia  Surgeon: Aldean Hummingbird   Assistants: none  Anesthesia: General endotracheal anesthesia and TAP block   Surgeon: Marianna Shirk. Elvan Hamel, MD, FACS  Procedure Details  The patient was seen again in the Holding Room. The risks, benefits, complications, treatment options, and expected outcomes were discussed with the patient. The possibilities of reaction to medication, pulmonary aspiration, perforation of viscus, bleeding, recurrent infection, the need for additional procedures, and development of a complication requiring transfusion or further operation were discussed with the patient and/or family. The likelihood of success in repairing the hernia and returning the patient to their previous functional status is good.  There was concurrence with the proposed plan, and informed consent was obtained. The site of surgery was properly noted/marked. The patient was taken to the Operating Room, identified as Marcus [name], and the procedure verified as left inguinal hernia repair. A Time Out was held and the above information confirmed. Pt received a TAP block in the OR by anesthesia.   The patient was placed in the supine position and underwent induction of anesthesia. The lower abdomen and groin was prepped with Chloraprep and draped in the standard fashion, and 0.25% Marcaine  with epinephrine  was used to anesthetize the skin over the mid-portion of the inguinal canal. An oblique incision was made. Dissection was carried down through the subcutaneous tissue with cautery to the external oblique fascia.  We opened the external oblique fascia along the direction of its fibers to the external ring.  The spermatic cord was circumferentially dissected bluntly and  retracted with a Penrose drain.  The floor of the inguinal canal was inspected & there was a direct defect..  I was able to reduce it easily.  We skeletonized the spermatic cord and there was no evidence of a cord lipoma or an indirect inguinal hernia sac..  We used a 3 x 6 inch piece of Bard soft mesh, which was cut into a keyhole shape.  This was secured with 2-0 Prolene, beginning at the pubic tubercle, running this along the shelving edge inferiorly. Superiorly, the mesh was secured to the internal oblique fascia with interrupted 2-0 Prolene sutures.  The tails of the mesh were sutured together behind the spermatic cord.  The mesh was tucked underneath the external oblique fascia laterally.  The external oblique fascia was reapproximated with 2-0 Vicryl.  3-0 Vicryl was used to close the subcutaneous tissues and 4-0 Monocryl was used to close the skin in subcuticular fashion.  Dermabond was used to seal the incision.   The patient was then extubated and brought to the recovery room in stable condition.  All sponge, instrument, and needle counts were correct prior to closure and at the conclusion of the case.   Estimated Blood Loss: Minimal                 Complications: None; patient tolerated the procedure well.         Disposition: PACU - hemodynamically stable.         Condition: stable  Marianna Shirk. Elvan Hamel, MD, FACS General, Bariatric, & Minimally Invasive Surgery [facility],  [facility]

## 2023-09-23 NOTE — Transfer of Care (Signed)
 Immediate Anesthesia Transfer of Care Note  Patient: Marcus [name]  Procedure(s) Performed: REPAIR, HERNIA, INGUINAL, ADULT (Left)  Patient Location: PACU  Anesthesia Type:General  Level of Consciousness: awake and drowsy  Airway & Oxygen Therapy: Patient Spontanous Breathing and Patient connected to face mask oxygen  Post-op Assessment: Report given to RN and Post -op Vital signs reviewed and stable  Post vital signs: Reviewed and stable  Last Vitals:  Vitals Value Taken Time  BP 129/67 09/23/23 1015  Temp 36.4 C 09/23/23 1015  Pulse 60 09/23/23 1020  Resp 15 09/23/23 1020  SpO2 95 % 09/23/23 1020  Vitals shown include unfiled device data.  Last Pain:  Vitals:   09/23/23 1015  TempSrc:   PainSc: Asleep         Complications: No notable events documented.

## 2023-09-24 ENCOUNTER — Encounter (HOSPITAL_COMMUNITY): Payer: Self-pay | Admitting: General Surgery

## 2023-09-26 NOTE — Anesthesia Postprocedure Evaluation (Signed)
 Anesthesia Post Note  Patient: Programmer, multimedia  Procedure(s) Performed: REPAIR, HERNIA, INGUINAL, ADULT (Left)     Patient location during evaluation: PACU Anesthesia Type: Regional and General Level of consciousness: awake and alert Pain management: pain level controlled Vital Signs Assessment: post-procedure vital signs reviewed and stable Respiratory status: spontaneous breathing, nonlabored ventilation and respiratory function stable Cardiovascular status: blood pressure returned to baseline and stable Postop Assessment: no apparent nausea or vomiting Anesthetic complications: no   No notable events documented.  Last Vitals:  Vitals:   09/23/23 1000 09/23/23 1015  BP: 133/74 129/67  Pulse: 67 (!) 58  Resp: 18 (!) 9  Temp:  36.4 C  SpO2: 96% 95%    Last Pain:  Vitals:   09/23/23 1015  TempSrc:   PainSc: Asleep                 Tanecia Mccay

## 2024-05-16 ENCOUNTER — Encounter (HOSPITAL_BASED_OUTPATIENT_CLINIC_OR_DEPARTMENT_OTHER): Attending: Internal Medicine | Admitting: Internal Medicine

## 2024-05-16 DIAGNOSIS — E11622 Type 2 diabetes mellitus with other skin ulcer: Secondary | ICD-10-CM | POA: Diagnosis not present

## 2024-05-16 DIAGNOSIS — L97818 Non-pressure chronic ulcer of other part of right lower leg with other specified severity: Secondary | ICD-10-CM | POA: Diagnosis not present

## 2024-05-16 DIAGNOSIS — I87311 Chronic venous hypertension (idiopathic) with ulcer of right lower extremity: Secondary | ICD-10-CM | POA: Insufficient documentation

## 2024-05-23 ENCOUNTER — Encounter (HOSPITAL_BASED_OUTPATIENT_CLINIC_OR_DEPARTMENT_OTHER): Admitting: Internal Medicine

## 2024-05-23 DIAGNOSIS — I87311 Chronic venous hypertension (idiopathic) with ulcer of right lower extremity: Secondary | ICD-10-CM

## 2024-05-23 DIAGNOSIS — E11622 Type 2 diabetes mellitus with other skin ulcer: Secondary | ICD-10-CM | POA: Diagnosis not present

## 2024-05-23 DIAGNOSIS — L97818 Non-pressure chronic ulcer of other part of right lower leg with other specified severity: Secondary | ICD-10-CM | POA: Diagnosis not present

## 2024-05-30 ENCOUNTER — Encounter (HOSPITAL_BASED_OUTPATIENT_CLINIC_OR_DEPARTMENT_OTHER): Admitting: Internal Medicine

## 2024-05-30 DIAGNOSIS — E11622 Type 2 diabetes mellitus with other skin ulcer: Secondary | ICD-10-CM | POA: Diagnosis not present

## 2024-05-30 DIAGNOSIS — I87311 Chronic venous hypertension (idiopathic) with ulcer of right lower extremity: Secondary | ICD-10-CM | POA: Diagnosis not present

## 2024-05-30 DIAGNOSIS — L97818 Non-pressure chronic ulcer of other part of right lower leg with other specified severity: Secondary | ICD-10-CM | POA: Diagnosis not present

## 2024-06-06 ENCOUNTER — Encounter (HOSPITAL_BASED_OUTPATIENT_CLINIC_OR_DEPARTMENT_OTHER): Admitting: Internal Medicine
